# Patient Record
Sex: Female | Born: 1940
Health system: Southern US, Community
[De-identification: ages and names within clinical notes are randomized; demographics above are authoritative.]

## PROBLEM LIST (undated history)

## (undated) DIAGNOSIS — R600 Localized edema: Secondary | ICD-10-CM

## (undated) DIAGNOSIS — F0280 Dementia in other diseases classified elsewhere without behavioral disturbance: Secondary | ICD-10-CM

## (undated) DIAGNOSIS — N179 Acute kidney failure, unspecified: Secondary | ICD-10-CM

## (undated) DIAGNOSIS — M5431 Sciatica, right side: Secondary | ICD-10-CM

## (undated) DIAGNOSIS — E669 Obesity, unspecified: Secondary | ICD-10-CM

## (undated) DIAGNOSIS — E6689 Other obesity not elsewhere classified: Secondary | ICD-10-CM

## (undated) DIAGNOSIS — E872 Acidosis, unspecified: Secondary | ICD-10-CM

## (undated) DIAGNOSIS — G629 Polyneuropathy, unspecified: Secondary | ICD-10-CM

## (undated) DIAGNOSIS — N319 Neuromuscular dysfunction of bladder, unspecified: Secondary | ICD-10-CM

## (undated) DIAGNOSIS — F32A Depression, unspecified: Secondary | ICD-10-CM

## (undated) DIAGNOSIS — I10 Essential (primary) hypertension: Secondary | ICD-10-CM

## (undated) DIAGNOSIS — F419 Anxiety disorder, unspecified: Secondary | ICD-10-CM

## (undated) DIAGNOSIS — E538 Deficiency of other specified B group vitamins: Secondary | ICD-10-CM

## (undated) DIAGNOSIS — Z87440 Personal history of urinary (tract) infections: Secondary | ICD-10-CM

## (undated) DIAGNOSIS — E785 Hyperlipidemia, unspecified: Secondary | ICD-10-CM

## (undated) DIAGNOSIS — Z794 Long term (current) use of insulin: Secondary | ICD-10-CM

## (undated) DIAGNOSIS — G9340 Encephalopathy, unspecified: Secondary | ICD-10-CM

## (undated) DIAGNOSIS — K5900 Constipation, unspecified: Secondary | ICD-10-CM

## (undated) DIAGNOSIS — R319 Hematuria, unspecified: Secondary | ICD-10-CM

## (undated) DIAGNOSIS — E876 Hypokalemia: Secondary | ICD-10-CM

## (undated) DIAGNOSIS — N3941 Urge incontinence: Secondary | ICD-10-CM

## (undated) DIAGNOSIS — G8929 Other chronic pain: Secondary | ICD-10-CM

## (undated) DIAGNOSIS — F0390 Unspecified dementia without behavioral disturbance: Secondary | ICD-10-CM

## (undated) DIAGNOSIS — E611 Iron deficiency: Secondary | ICD-10-CM

## (undated) DIAGNOSIS — K589 Irritable bowel syndrome without diarrhea: Secondary | ICD-10-CM

## (undated) DIAGNOSIS — N189 Chronic kidney disease, unspecified: Secondary | ICD-10-CM

## (undated) DIAGNOSIS — E038 Other specified hypothyroidism: Secondary | ICD-10-CM

## (undated) DIAGNOSIS — G5600 Carpal tunnel syndrome, unspecified upper limb: Secondary | ICD-10-CM

## (undated) DIAGNOSIS — I2510 Atherosclerotic heart disease of native coronary artery without angina pectoris: Secondary | ICD-10-CM

## (undated) DIAGNOSIS — E668 Other obesity: Secondary | ICD-10-CM

## (undated) DIAGNOSIS — E034 Atrophy of thyroid (acquired): Secondary | ICD-10-CM

## (undated) DIAGNOSIS — M797 Fibromyalgia: Secondary | ICD-10-CM

## (undated) DIAGNOSIS — Z7901 Long term (current) use of anticoagulants: Secondary | ICD-10-CM

## (undated) DIAGNOSIS — E43 Unspecified severe protein-calorie malnutrition: Secondary | ICD-10-CM

## (undated) DIAGNOSIS — R221 Localized swelling, mass and lump, neck: Secondary | ICD-10-CM

## (undated) DIAGNOSIS — K219 Gastro-esophageal reflux disease without esophagitis: Secondary | ICD-10-CM

## (undated) DIAGNOSIS — E559 Vitamin D deficiency, unspecified: Secondary | ICD-10-CM

## (undated) DIAGNOSIS — E119 Type 2 diabetes mellitus without complications: Secondary | ICD-10-CM

## (undated) DIAGNOSIS — G4733 Obstructive sleep apnea (adult) (pediatric): Secondary | ICD-10-CM

## (undated) HISTORY — DX: Gastro-esophageal reflux disease without esophagitis: K21.9

## (undated) HISTORY — PX: APPENDECTOMY: SHX54

## (undated) HISTORY — DX: Type 2 diabetes mellitus without complications: E11.9

## (undated) HISTORY — DX: Deficiency of other specified B group vitamins: E53.8

## (undated) HISTORY — DX: Other obesity not elsewhere classified: E66.89

## (undated) HISTORY — PX: HYSTERECTOMY ABDOMINAL WITH SALPINGO-OOPHORECTOMY: SHX6792

## (undated) HISTORY — PX: BLADDER SUSPENSION: SHX72

## (undated) HISTORY — PX: VEIN LIGATION AND STRIPPING: SHX2653

## (undated) HISTORY — DX: Other specified hypothyroidism: E03.8

## (undated) HISTORY — DX: Urge incontinence: N39.41

## (undated) HISTORY — DX: Atrophy of thyroid (acquired): E03.4

## (undated) HISTORY — DX: Sciatica, right side: M54.31

## (undated) HISTORY — DX: Carpal tunnel syndrome, unspecified upper limb: G56.00

## (undated) HISTORY — DX: Hyperlipidemia, unspecified: E78.5

## (undated) HISTORY — DX: Localized swelling, mass and lump, neck: R22.1

## (undated) HISTORY — DX: Fibromyalgia: M79.7

## (undated) HISTORY — DX: Other chronic pain: G89.29

## (undated) HISTORY — DX: Vitamin D deficiency, unspecified: E55.9

## (undated) HISTORY — DX: Obstructive sleep apnea (adult) (pediatric): G47.33

## (undated) HISTORY — DX: Essential (primary) hypertension: I10

## (undated) HISTORY — PX: BREAST BIOPSY: SHX20

## (undated) HISTORY — DX: Neuromuscular dysfunction of bladder, unspecified: N31.9

## (undated) HISTORY — DX: Irritable bowel syndrome, unspecified: K58.9

## (undated) HISTORY — DX: Localized edema: R60.0

## (undated) HISTORY — DX: Other obesity: E66.8

## (undated) HISTORY — PX: LUMBAR LAMINECTOMY: SHX95

---

## 1978-11-10 HISTORY — PX: CHOLECYSTECTOMY OPEN: SUR202

## 2000-11-10 HISTORY — PX: TOTAL SHOULDER REPLACEMENT: SUR1217

## 2000-12-03 ENCOUNTER — Ambulatory Visit (HOSPITAL_COMMUNITY): Admission: RE | Admit: 2000-12-03 | Discharge: 2000-12-03 | Payer: Self-pay | Admitting: Orthopedic Surgery

## 2000-12-03 ENCOUNTER — Encounter: Payer: Self-pay | Admitting: Orthopedic Surgery

## 2003-12-25 ENCOUNTER — Inpatient Hospital Stay (HOSPITAL_COMMUNITY): Admission: RE | Admit: 2003-12-25 | Discharge: 2003-12-28 | Payer: Self-pay | Admitting: Orthopedic Surgery

## 2004-12-16 ENCOUNTER — Ambulatory Visit: Payer: Self-pay | Admitting: Pulmonary Disease

## 2005-01-14 ENCOUNTER — Ambulatory Visit: Payer: Self-pay | Admitting: Internal Medicine

## 2005-01-30 ENCOUNTER — Ambulatory Visit: Payer: Self-pay | Admitting: Internal Medicine

## 2007-05-27 ENCOUNTER — Ambulatory Visit (HOSPITAL_BASED_OUTPATIENT_CLINIC_OR_DEPARTMENT_OTHER): Admission: RE | Admit: 2007-05-27 | Discharge: 2007-05-27 | Payer: Self-pay | Admitting: Orthopedic Surgery

## 2007-05-27 ENCOUNTER — Encounter (INDEPENDENT_AMBULATORY_CARE_PROVIDER_SITE_OTHER): Payer: Self-pay | Admitting: Orthopedic Surgery

## 2007-12-31 ENCOUNTER — Inpatient Hospital Stay (HOSPITAL_COMMUNITY): Admission: RE | Admit: 2007-12-31 | Discharge: 2008-01-01 | Payer: Self-pay | Admitting: Neurosurgery

## 2011-02-09 HISTORY — PX: RECTOCELE REPAIR: SHX761

## 2011-03-25 NOTE — Op Note (Signed)
Cindy Gordon             ACCOUNT NO.:  [id number]   MEDICAL RECORD NO.:  [id number]          PATIENT TYPE:  INP   LOCATION:  2899                         FACILITY:  [facility]   PHYSICIAN:  Marchia Meiers. Vertell Limber, M.D.  DATE OF BIRTH:  12/24/1940   DATE OF PROCEDURE:  12/31/2007  DATE OF DISCHARGE:                               OPERATIVE REPORT   PREOPERATIVE DIAGNOSIS:  L4-L5 stenosis, spondylosis, herniated disc,  and radiculopathy.   POSTOPERATIVE DIAGNOSIS:  L4-L5 stenosis, spondylosis, herniated disc,  and radiculopathy.   PROCEDURE:  Bilateral decompression at L4-L5 for stenosis with  microdissection.   SURGEON:  Marchia Meiers. Vertell Limber, M.D.   ASSISTANT:  Otilio Connors, M.D.  Verdis Prime, R.N.   ANESTHESIA:  General endotracheal anesthesia.   ESTIMATED BLOOD LOSS:  Minimal.   COMPLICATIONS:  None.   DISPOSITION:  To recovery.   INDICATIONS:  Cindy Gordon is a 70 year old woman with bilateral  stenosis at L4-L5 with severe left leg pain and weakness.  It was  elected to take her to surgery for decompression at the L4-L5 level.  The plan was to do this in conjunction with Baxano Spinal Decompression  System.   PROCEDURE IN DETAIL:  The patient was brought to the operating room and  following satisfactory uncomplicated general endotracheal anesthesia and  placement of intravenous lines, she had EMG electrodes placed at L2, L3,  L4, L5 and S1 dermatomes.  The patient was then turned in a prone  position on the Wilson frame.  Her low back was prepped and draped in  the usual sterile fashion. After confirming the correct level with C-arm  fluoroscopy, a 4 cm midline incision was made and carried through  copious adipose tissue to the lumbodorsal fascia.  This was incised  bilaterally.  A subperiosteal dissection was performed exposing the L4-  L5 interspace and intraoperative fluoroscopic image correctly  demonstrated this level. A hemilaminotomy of L4 was performed with  a  high speed drill and Kerrison rongeurs with decompression of ligamentous  tissue and the decompression was carried to the midline. Using a Baxano  neural localization probe and multiblade shaver, a foraminal  decompression was performed with 30 reciprocating strokes and then a  decompression along the L5 nerve root, again with 30 strokes of  reciprocation, and after neural localization confirmed no evidence of  irritability in the L5 or L4 nerve roots.  C-arm radiography  demonstrated good bone and ligamentous removal and decompression along  the neural elements.  Subsequently, an effort was made to perform a  decompression ipsilaterally, but there was increased stimulation along  the L5 nerve root and L4 nerve root and it was felt the foraminal  stenosis was too severe and consequently, the decompression was done  using standard microdissection technique with operating microscope with  medial facetectomy and decompression along the L5 nerve root, with a  foraminotomy along the L5 nerve root. At this point, dilator probes  could easily be inserted along the course of the L5 nerve root, the  thecal sac, and into the neural foramen.  Hemostasis then assured.  The  wound  was irrigated.  The operative site was bathed in fentanyl and Depo-  Medrol.  The self-retaining retractor was removed.  The lumbodorsal  fascia was closed with 0 Vicryl sutures, subcutaneous tissues were  reapproximated with 2-0 Vicryl interrupted inverted sutures, and the  skin edges were reapproximated with interrupted 3-0 Vicryl subcuticular  stitch.  The wound was dressed with Dermabond.  The patient was  extubated in the operating table and taken to recovery in stable  satisfactory condition having tolerated the operation well.  Counts were  correct at the end of the case.      Marchia Meiers. Vertell Limber, M.D.  Electronically Signed     JDS/MEDQ  D:  12/31/2007  T:  01/01/2008  Job:  [id number]

## 2011-03-25 NOTE — Op Note (Signed)
Cindy, Gordon             ACCOUNT NO.:  [id number]   MEDICAL RECORD NO.:  [id number]          PATIENT TYPE:  AMB   LOCATION:  [place]                          FACILITY:  [facility]   PHYSICIAN:  Youlanda Mighty. Sypher, M.D. 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   OPERATION:  Marginal excision of left ring finger middle phalangeal  giant cell tumor.   SURGEON:  Youlanda Mighty. Sypher, M.D.   ASSISTANT:  Marily Lente. Dasnoit, P.A.-C.   ANESTHESIA:  IV regional at proximal brachial level.   SUPERVISING ANESTHESIOLOGIST:  Leda Quail, M.D.   INDICATIONS:  Cindy Gordon is a 70 year old woman referred through  the courtesy of Dr. Frederik Pear, attending orthopedic surgeon, for  evaluation and management of an enlarging mass on the radial aspect of  her left ring finger middle phalangeal segment.  Cindy Gordon is well  acquainted with our practice.  She was seeing Dr. Mayer Camel for follow up of  a shoulder reconstruction he had performed and brought to his attention  an enlarging mass on the radial aspect of her left ring finger middle  phalangeal segment.  The mass appeared to be infiltrating into her  extensor and flexor mechanisms.  Dr. Mayer Camel sent her for an MRI that  documented an infiltrating mass consistent with a probable giant cell  tumor.  She was subsequently referred for an upper extremity orthopedic  consult.  After informed consent, she is brought to the operating at  this time anticipating excisional biopsy.   PROCEDURE:  Cindy Gordon is brought to the operating room and placed  in the supine position on the operating table.  After proper  identification of her operative site and recognition of her penicillin  allergy  and intolerance of codeine, the left arm was prepped with  Betadine soap solution and sterilely draped.  A pneumatic tourniquet was  applied to the proximal brachium.  Following exsanguination of her arm,  an IV regional block was placed.  The arm was then prepped with Betadine  soap solution and sterilely draped.   When anesthesia was noted to be adequate, we proceeded with a radial mid  lateral incision exposing the mass and the radial neurovascular bundle.  The radial proper digital artery and nerve were tented by the giant cell  tumor that measured approximately 15 mm length and 12 mm in width.  This  had infiltrated deep to the flexor mechanism near the vincula to the  profundus tendon.  This had also infiltrated superficially on the dorsum  of the finger and extended deep to the extensor.  The margins of the  giant cell tumor were identified and using the pseudo-encapsulation as a  guide to its extent, the entire lesion was excised en bloc.  A fine  rongeur was then used to strip the periosteum off of the middle phalanx  in  an effort to prevent recurrence.  Bleeding points were  electrocauterized with bipolar current followed by use of the bipolar  forceps to desiccate the periosteum at the site of origin of the mass.  It appeared this was originating deep to the flexor tendon adjacent to  the profundus vincula.   The wound was then repaired with multiple trauma sutures of 5-0 nylon.  A compressive dressing was applied with Xeroflow, sterile gauze and  Coban.  There were no apparent complications.  Cindy Gordon tolerated  the surgery and anesthesia well.  She was transferred to the recovery  room with stable vital signs.      Youlanda Mighty Sypher, M.D.  Electronically Signed     RVS/MEDQ  D:  05/27/2007  T:  05/27/2007  Job:  [id number]   cc:   Kathalene Frames. Mayer Camel, M.D.

## 2011-03-28 NOTE — Op Note (Signed)
NAME:  Cindy Gordon, Cindy Gordon                       ACCOUNT NO.:  [id number]   MEDICAL RECORD NO.:  [id number]                   PATIENT TYPE:  INP   LOCATION:  NA                                   FACILITY:  [facility]   PHYSICIAN:  Kathalene Frames. Mayer Camel, M.D.                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   PROCEDURE:  Right shoulder hemiarthroplasty using a DePuy global total  shoulder system with a 10 stem and a 44 x 18 CTA head for rotator cuff  deficiency.   SURGEON:  Kathalene Frames. Mayer Camel, M.D.   FIRST ASSISTANT:  Opal Sidles B. Mancel Bale, P.A.-C.   ANESTHESIA:  General endotracheal anesthesia.   ESTIMATED BLOOD LOSS:  300 mL.   FLUIDS REPLACED:  1200 mL Crystalloid.   DRAINS:  None.   TOURNIQUET TIME:  None.   INDICATIONS FOR PROCEDURE:  The patient is a 70 year old woman who had an  open rotator cuff repair 5 years ago and was complicated  with a  postoperative Staph infection down in [place], [place]. She  underwent 2 debridements, one on the heel, uneventfully except for post  infectious degenerative arthritis of the right shoulder which we have  monitored carefully  for the last 4 years and treated with antiinflammatory  medications, pain medications and cortisone injections. There has been no  further  signs of infection for 4-1/2 years and she now desires elective  right shoulder hemiarthroplasty with a probable CTA ahead for rotator cuff  deficiency. She has a high riding humeral head consistent with a rotator  cuff deficiency, complete loss of articular cartilage of the humeral head  and some subchondral cystic changes. Eversion of the glenoid fortunately is  relatively  normal.   DESCRIPTION OF PROCEDURE:   The patient was identified by arm band and taken  to the operating room at [facility]. Appropriate anesthetic  monitors were attached and general endotracheal anesthesia was induced for  the patient in the supine position. She was then placed in the beachchair  position about 45 degrees, and the right upper extremity was prepped and  draped in the usual sterile fashion from the wrist to the hemithorax.   A  deltopectoral incision starting out just above the clavicle going over  the coracoid and then distally towards the insertion of the deltoid muscle  was made approximately 15 cm in length through the skin and subcutaneous  tissue. We identified  the cephalic vein which was retracted laterally along  with the deltoid muscle, exposing the coracoid process and the conjoint  tendon.   We retracted  the conjoint tendon medially and the deltoid laterally,  exposing the  clavipectoral fascia, which was then incised, exposing the  subscapularis tendon which was then peeled off the insertion on the lesser  tuberosity along with the capsule and tagged with five #2 Ethibond sutures.  We immediately identified the arthritic humeral head. Approximately 1 cm of  the pec insertion was then cut superiorly to allow better external rotator  and deliverance of the humeral head from the glenoid.   There was an intact biceps tendon. The supraspinatus tendon was thin and  filmy and basically totally deficient. At the apex of the humeral head  laterally, we entered the proximal  humerus with the hand reamer from the  DePuy global set and reamed up to a 10 axial reamer to the appropriate depth  and then placed the cutting guide on the reamer, setting  the eversion at  about 30 degrees of retroversion and using the guide to locate this. The  guide was then pinned into place to allow the humeral head resection.   The reamer was then removed and the humeral head resected and measured to a  44 x 18  humeral head. The humeral body osteotome was then hammered into  place in the correct version, followed  by broaching up to a 10 broach at  the appropriate depth. Using rongeurs we cleared out a small  area of bone  laterally and applied a 44 x 18 CTA head onto the broach, and the shoulder  was reduced and stability was noted to external rotation of 60 to 70 degrees  without difficulty as well as full internal rotation.   A real 10 stem was then opened as well as a 44 x 18 humeral head. All bony  surfaces were water picked, dried with suction and sponges and we hammered  into place the 10 stem in the correct version and  the 44 x 18 humeral head  was then hammered onto the stem. The glenoid was cleared. The shoulder was  reduced and stability was noted to be excellent.   We then repaired the capsule and subscapularis back to the lesser tuberosity  through drill holes with the five #2 Ethibond sutures. Stability was once  again checked and found to be excellent. The wound was once again irrigated  with normal saline solution. The subcutaneous tissue was closed with 2-0  Vicryl suture and the skin with running interlocking 3-0 nylon suture. A  shoulder immobilizer was applied. The patient was placed in the supine  position, awakened and taken to the recovery room without difficulty.                                               Kathalene Frames. Mayer Camel, M.D.    Cindy Gordon  D:  12/25/2003  T:  12/25/2003  Job:  [id number]

## 2011-03-28 NOTE — Consult Note (Signed)
NAME:  Cindy Gordon, Cindy Gordon                       ACCOUNT NO.:  [id number]   MEDICAL RECORD NO.:  [id number]                   PATIENT TYPE:  INP   LOCATION:  [id number]                                 FACILITY:  [facility]   PHYSICIAN:  Asencion Noble, M.D. [facility]            DATE OF BIRTH:  Oct 07, 1941   DATE OF CONSULTATION:  12/25/2003  DATE OF DISCHARGE:                                   CONSULTATION   REFERRING PHYSICIAN:  Kathalene Frames. Mayer Camel, M.D.   CHIEF COMPLAINT:  Apnea.   HISTORY OF PRESENT ILLNESS:  A 70 year old white female underwent right  hemiarthroplasty of the shoulder this morning, now has postop apnea and is  on BiPAP.  Previous history of snoring and possible sleep apnea, although no  formal sleep study has ever been done.  Previous history of cardiomyopathy,  hypertension, no MI. Does have no prior history of known lung disease.   MEDICATIONS PRIOR TO ADMISSION:  Zantac, Flexeril, Librax, Neurontin,  Zyrtec, Flexeril, Bextra, Estrace, aspirin.   ALLERGIES:  PENICILLIN AND CODEINE.   PHYSICAL EXAMINATION:  VITAL SIGNS:  Blood pressure 130/80, pulse 60,  saturations 100% . Patient is on a full face bilevel mask.  LUNGS:  Clear.  CARDIOVASCULAR:  Regular rate and rhythm.  ABDOMEN:  Soft, nontender.  EXTREMITIES:  Right shoulder dressing intact.   LABORATORY DATA:  No postop labs as yet obtained.   IMPRESSION:  Probable sleep apnea exacerbated by general anesthesia and  right shoulder hemiarthroplasty.   RECOMMENDATIONS:  Full face bilevel, follow up blood gases, follow up lab  data.                                               Asencion Noble, M.D. [facility]    PW/MEDQ  D:  12/25/2003  T:  12/25/2003  Job:  [id number]   cc:   Kathalene Frames. Mayer Camel, M.D.  [address]  [place]  [place] [address]  Fax: [phone]

## 2011-03-28 NOTE — Discharge Summary (Signed)
NAME:  GAYNEL, JANAS                       ACCOUNT NO.:  [id number]   MEDICAL RECORD NO.:  [id number]                   PATIENT TYPE:  INP   LOCATION:  5024                                 FACILITY:  [facility]   PHYSICIAN:  Kathalene Frames. Mayer Camel, M.D.                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   PROCEDURE:  Right shoulder hemiarthroplasty.   HISTORY OF PRESENT ILLNESS:  The patient is a 70 year old woman who had an  open rotator cuff repair five years prior to this admission which was  complicated with a postoperative staphylococcus infection.  This was  performed in [place], [place].  She underwent two debridements which  was uneventful except for the postinfectious degenerative arthritis of the  right shoulder which has been monitored carefully by Dr. Kathalene Frames. Mayer Camel over  the last four years.  Prior to this surgery, she was treated with anti-  inflammatory medications, pain medications, and cortisone injection.  There  have been no further signs of infection for the last four and a half years  and she is now desiring elective right shoulder hemiarthroplasty with the  probable CLEAR TO AUSCULTATION head for rotator cuff deficiency.  She has  high riding humeral head consistent with rotator cuff deficiency on x-ray  with complete loss of articular cartilage and humeral head along with some  subchondral cystic changes, aversion of the glenoid.  Fortunately, it is  relatively normal.   ALLERGIES:  The patient is allergic to CODEINE which causes and PENICILLIN  which causes rash.   MEDICATIONS:  1. Zestril 40 mg q.d.  2. Librax 5/2.5 t.i.d. p.r.n.  3. Neurontin 300 mg t.i.d.  4. Zyrtec 10 mg q.d.  5. Flexeril 10 mg p.r.n.  6. Bextra 20 mg q.d.  7. Estrace 0.5 mg q.o.d.  8. B12  infections every week.  9. Aspirin 81 mg q.d.  10.      Vitamins.   MEDICATIONS:  1. History of cardiomyopathy.  2. Hypertension.  3. No evidence of a myocardial infarction.  4. No history of known lung disease or other illnesses.  5. She does have a history of fibromyalgia.  6. Gastroesophageal reflux disease with reflux.  7. Chronic anemia.   PAST SURGICAL HISTORY:  1. Rotator cuff repair in 2000 with postoperative infection causing at least     two I&Ds.  2. Cholecystectomy in 1984.  3. Hysterectomy in 1964.  4. Cardiac catheterization.  No difficulties with GET from the surgeries.   PHYSICAL EXAMINATION:  Unfortunately preoperative exam and family history  are unavailable at the time of this dictation.   LABORATORY DATA:  Preoperative labs including CBC, serum CMET, chest x-ray,  EKG, PT, and PTT were all within normal limits  with the exception of her EKG  which did show a nonspecific ST and T-wave abnormalities with possible  anterior ischemia.   HOSPITAL COURSE:  On the date of admission, the patient was taken to [facility] operating room where she underwent a right shoulder  hemiarthroplasty using a DePuy global total shoulder system  hemiarthroplasty, #10 stem, and a 44 by 18 CTA head because of rotator cuff  deficiency.  She was placed on preoperative antibiotics for the first 48  hours.  She was placed on postoperative Coumadin prophylaxis and she is  placed on the PCA Dilaudid pain pump for pain control.  Stent graft seen at  the sign of surgery showed no sign of any type of infectious agent and these  labs remained no growth throughout their culture.   The patient had difficulty postoperatively breathing consistent with  postoperative apnea.  She continued to have difficulties maintaining her  saturation level and Dr. Asencion Noble was consulted because of no previous  treatment for any sort of sleep apnea.  He felt that this was probably  sleep  apnea exacerbated by general anesthesia and she was placed on a BiPAP for  the treatment of this, but she did go to the unit postoperatively because of  this difficulty.   Postoperative day one, the patient was feeling better.  She was doing better  holding her oxygenation without the BiPAP when she was awake.  She was  complaining of moderate pain in the right shoulder with no nausea or  vomiting.  She was afebrile.  Hemoglobin of 9.5, INR of 1.1.  Dressing with  scant bleed through and positive ecchymosis in the right upper extremity  consistent with surgery.   She began gentle passive range of motion with some gentle active assist  motion.  Leaning forward flexion to 90 degrees maximum and a home CPAP was  set up through [facility].   Postoperative day two, the patient was complaining of nausea, no itching,  and positive itching.  Temperature was 100.3.  Ranging to 97.2.  Dressing  was dry.  Decreasing edema in the right upper extremity.  She is otherwise  stable orthopedically and home health was helping to implement respiratory  support as per respiratory therapy suggestion.  She continued with PT and  OT.  PCI was discontinued.   Postoperative day three, the patient continued to make good progress and was  otherwise benign to exam.  She was discharged home to the care of her  family.  She will continue to use support at home using a CPAP machine for  her sleep apnea which was diagnosed during her hospital stay.  She will  continue on gentle passive range of motion exercises and active with passive  assist.  She should return to see Dr. Kathalene Frames. Mayer Camel in approximately one  week time.   DISCHARGE MEDICATIONS:  Prescription were given for Percocet and Coumadin  which will be monitored by home health care for the next two weeks.   FOLLOW UP:  She will return to see in approximately one-week time for follow-  up.  DIET:  Diet is regular.   CONDITION ON  DISCHARGE:  Stable and improved compared to that prior to  surgery.      Lowell Guitar. Mercie Eon.                     Kathalene Frames. Mayer Camel, M.D.    Andres Ege  D:  01/22/2004  T:  01/23/2004  Job:  [id number]

## 2011-08-01 LAB — BASIC METABOLIC PANEL
CO2: 31
Chloride: 104
Creatinine, Ser: 0.84
GFR calc Af Amer: >60

## 2011-08-01 LAB — CBC
Hemoglobin: 13.1
MCHC: 34.3
MCV: 93.2
RBC: 4.11

## 2011-08-25 LAB — POCT HEMOGLOBIN-HEMACUE: Operator id: 123881

## 2014-08-10 HISTORY — PX: OTHER SURGICAL HISTORY: SHX169

## 2014-11-13 DIAGNOSIS — M25532 Pain in left wrist: Secondary | ICD-10-CM | POA: Diagnosis not present

## 2014-11-15 DIAGNOSIS — M25532 Pain in left wrist: Secondary | ICD-10-CM | POA: Diagnosis not present

## 2014-11-22 DIAGNOSIS — E782 Mixed hyperlipidemia: Secondary | ICD-10-CM | POA: Diagnosis not present

## 2014-11-22 DIAGNOSIS — E780 Pure hypercholesterolemia: Secondary | ICD-10-CM | POA: Diagnosis not present

## 2014-11-22 DIAGNOSIS — G5602 Carpal tunnel syndrome, left upper limb: Secondary | ICD-10-CM | POA: Diagnosis not present

## 2014-11-22 DIAGNOSIS — I10 Essential (primary) hypertension: Secondary | ICD-10-CM | POA: Diagnosis not present

## 2014-11-22 DIAGNOSIS — N3941 Urge incontinence: Secondary | ICD-10-CM | POA: Diagnosis not present

## 2014-11-22 DIAGNOSIS — M797 Fibromyalgia: Secondary | ICD-10-CM | POA: Diagnosis not present

## 2014-11-22 DIAGNOSIS — R7301 Impaired fasting glucose: Secondary | ICD-10-CM | POA: Diagnosis not present

## 2014-12-16 ENCOUNTER — Encounter: Payer: Self-pay | Admitting: Internal Medicine

## 2015-01-15 DIAGNOSIS — G5791 Unspecified mononeuropathy of right lower limb: Secondary | ICD-10-CM | POA: Diagnosis not present

## 2015-01-15 DIAGNOSIS — G629 Polyneuropathy, unspecified: Secondary | ICD-10-CM | POA: Diagnosis not present

## 2015-01-15 DIAGNOSIS — M5431 Sciatica, right side: Secondary | ICD-10-CM | POA: Diagnosis not present

## 2015-01-15 DIAGNOSIS — N390 Urinary tract infection, site not specified: Secondary | ICD-10-CM | POA: Diagnosis not present

## 2015-01-15 DIAGNOSIS — M7061 Trochanteric bursitis, right hip: Secondary | ICD-10-CM | POA: Diagnosis not present

## 2015-02-13 DIAGNOSIS — M5416 Radiculopathy, lumbar region: Secondary | ICD-10-CM | POA: Diagnosis not present

## 2015-02-26 DIAGNOSIS — R0602 Shortness of breath: Secondary | ICD-10-CM | POA: Diagnosis not present

## 2015-02-26 DIAGNOSIS — R9431 Abnormal electrocardiogram [ECG] [EKG]: Secondary | ICD-10-CM | POA: Diagnosis not present

## 2015-02-26 DIAGNOSIS — I10 Essential (primary) hypertension: Secondary | ICD-10-CM | POA: Diagnosis not present

## 2015-02-26 DIAGNOSIS — E780 Pure hypercholesterolemia: Secondary | ICD-10-CM | POA: Diagnosis not present

## 2015-02-26 DIAGNOSIS — J309 Allergic rhinitis, unspecified: Secondary | ICD-10-CM | POA: Diagnosis not present

## 2015-02-26 DIAGNOSIS — R7301 Impaired fasting glucose: Secondary | ICD-10-CM | POA: Diagnosis not present

## 2015-04-17 DIAGNOSIS — M7062 Trochanteric bursitis, left hip: Secondary | ICD-10-CM | POA: Diagnosis not present

## 2015-04-17 DIAGNOSIS — M5416 Radiculopathy, lumbar region: Secondary | ICD-10-CM | POA: Diagnosis not present

## 2015-05-29 DIAGNOSIS — N319 Neuromuscular dysfunction of bladder, unspecified: Secondary | ICD-10-CM | POA: Diagnosis not present

## 2015-05-29 DIAGNOSIS — E780 Pure hypercholesterolemia: Secondary | ICD-10-CM | POA: Diagnosis not present

## 2015-05-29 DIAGNOSIS — I10 Essential (primary) hypertension: Secondary | ICD-10-CM | POA: Diagnosis not present

## 2015-05-29 DIAGNOSIS — R7301 Impaired fasting glucose: Secondary | ICD-10-CM | POA: Diagnosis not present

## 2015-05-29 DIAGNOSIS — G629 Polyneuropathy, unspecified: Secondary | ICD-10-CM | POA: Diagnosis not present

## 2015-05-29 DIAGNOSIS — K219 Gastro-esophageal reflux disease without esophagitis: Secondary | ICD-10-CM | POA: Diagnosis not present

## 2015-05-29 DIAGNOSIS — J309 Allergic rhinitis, unspecified: Secondary | ICD-10-CM | POA: Diagnosis not present

## 2015-05-29 DIAGNOSIS — M797 Fibromyalgia: Secondary | ICD-10-CM | POA: Diagnosis not present

## 2015-06-20 ENCOUNTER — Encounter: Payer: Self-pay | Admitting: Internal Medicine

## 2015-07-10 DIAGNOSIS — M47816 Spondylosis without myelopathy or radiculopathy, lumbar region: Secondary | ICD-10-CM | POA: Diagnosis not present

## 2015-07-30 DIAGNOSIS — J018 Other acute sinusitis: Secondary | ICD-10-CM | POA: Diagnosis not present

## 2015-08-07 DIAGNOSIS — R609 Edema, unspecified: Secondary | ICD-10-CM | POA: Diagnosis not present

## 2015-08-09 DIAGNOSIS — F4321 Adjustment disorder with depressed mood: Secondary | ICD-10-CM | POA: Diagnosis not present

## 2015-08-09 DIAGNOSIS — I10 Essential (primary) hypertension: Secondary | ICD-10-CM | POA: Diagnosis not present

## 2015-08-09 DIAGNOSIS — T783XXA Angioneurotic edema, initial encounter: Secondary | ICD-10-CM | POA: Diagnosis not present

## 2015-08-09 DIAGNOSIS — Z23 Encounter for immunization: Secondary | ICD-10-CM | POA: Diagnosis not present

## 2015-08-31 DIAGNOSIS — N319 Neuromuscular dysfunction of bladder, unspecified: Secondary | ICD-10-CM | POA: Diagnosis not present

## 2015-08-31 DIAGNOSIS — Z1231 Encounter for screening mammogram for malignant neoplasm of breast: Secondary | ICD-10-CM | POA: Diagnosis not present

## 2015-08-31 DIAGNOSIS — M797 Fibromyalgia: Secondary | ICD-10-CM | POA: Diagnosis not present

## 2015-08-31 DIAGNOSIS — I10 Essential (primary) hypertension: Secondary | ICD-10-CM | POA: Diagnosis not present

## 2015-08-31 DIAGNOSIS — E7800 Pure hypercholesterolemia, unspecified: Secondary | ICD-10-CM | POA: Diagnosis not present

## 2015-08-31 DIAGNOSIS — K5901 Slow transit constipation: Secondary | ICD-10-CM | POA: Diagnosis not present

## 2015-08-31 DIAGNOSIS — Z23 Encounter for immunization: Secondary | ICD-10-CM | POA: Diagnosis not present

## 2015-08-31 DIAGNOSIS — R7301 Impaired fasting glucose: Secondary | ICD-10-CM | POA: Diagnosis not present

## 2015-09-25 DIAGNOSIS — Z1211 Encounter for screening for malignant neoplasm of colon: Secondary | ICD-10-CM | POA: Diagnosis not present

## 2015-09-25 DIAGNOSIS — Z6831 Body mass index (BMI) 31.0-31.9, adult: Secondary | ICD-10-CM | POA: Diagnosis not present

## 2015-09-25 DIAGNOSIS — Z23 Encounter for immunization: Secondary | ICD-10-CM | POA: Diagnosis not present

## 2015-09-25 DIAGNOSIS — Z0000 Encounter for general adult medical examination without abnormal findings: Secondary | ICD-10-CM | POA: Diagnosis not present

## 2015-10-05 DIAGNOSIS — Z1231 Encounter for screening mammogram for malignant neoplasm of breast: Secondary | ICD-10-CM | POA: Diagnosis not present

## 2015-12-11 DIAGNOSIS — L84 Corns and callosities: Secondary | ICD-10-CM | POA: Diagnosis not present

## 2015-12-11 DIAGNOSIS — M797 Fibromyalgia: Secondary | ICD-10-CM | POA: Diagnosis not present

## 2015-12-11 DIAGNOSIS — M7062 Trochanteric bursitis, left hip: Secondary | ICD-10-CM | POA: Diagnosis not present

## 2015-12-11 DIAGNOSIS — E782 Mixed hyperlipidemia: Secondary | ICD-10-CM | POA: Diagnosis not present

## 2015-12-11 DIAGNOSIS — N319 Neuromuscular dysfunction of bladder, unspecified: Secondary | ICD-10-CM | POA: Diagnosis not present

## 2015-12-11 DIAGNOSIS — I10 Essential (primary) hypertension: Secondary | ICD-10-CM | POA: Diagnosis not present

## 2015-12-11 DIAGNOSIS — R7301 Impaired fasting glucose: Secondary | ICD-10-CM | POA: Diagnosis not present

## 2015-12-26 DIAGNOSIS — N3001 Acute cystitis with hematuria: Secondary | ICD-10-CM | POA: Diagnosis not present

## 2015-12-26 DIAGNOSIS — N3000 Acute cystitis without hematuria: Secondary | ICD-10-CM | POA: Diagnosis not present

## 2016-01-10 DIAGNOSIS — N3001 Acute cystitis with hematuria: Secondary | ICD-10-CM | POA: Diagnosis not present

## 2016-03-12 DIAGNOSIS — R7301 Impaired fasting glucose: Secondary | ICD-10-CM | POA: Diagnosis not present

## 2016-03-12 DIAGNOSIS — E559 Vitamin D deficiency, unspecified: Secondary | ICD-10-CM | POA: Diagnosis not present

## 2016-03-12 DIAGNOSIS — I10 Essential (primary) hypertension: Secondary | ICD-10-CM | POA: Diagnosis not present

## 2016-03-20 DIAGNOSIS — N319 Neuromuscular dysfunction of bladder, unspecified: Secondary | ICD-10-CM | POA: Diagnosis not present

## 2016-03-20 DIAGNOSIS — M7062 Trochanteric bursitis, left hip: Secondary | ICD-10-CM | POA: Diagnosis not present

## 2016-03-20 DIAGNOSIS — I10 Essential (primary) hypertension: Secondary | ICD-10-CM | POA: Diagnosis not present

## 2016-03-20 DIAGNOSIS — E782 Mixed hyperlipidemia: Secondary | ICD-10-CM | POA: Diagnosis not present

## 2016-03-20 DIAGNOSIS — R300 Dysuria: Secondary | ICD-10-CM | POA: Diagnosis not present

## 2016-03-20 DIAGNOSIS — R7301 Impaired fasting glucose: Secondary | ICD-10-CM | POA: Diagnosis not present

## 2016-03-20 DIAGNOSIS — M6281 Muscle weakness (generalized): Secondary | ICD-10-CM | POA: Diagnosis not present

## 2016-03-20 DIAGNOSIS — M797 Fibromyalgia: Secondary | ICD-10-CM | POA: Diagnosis not present

## 2016-06-25 DIAGNOSIS — Z1211 Encounter for screening for malignant neoplasm of colon: Secondary | ICD-10-CM | POA: Diagnosis not present

## 2016-06-25 DIAGNOSIS — M25551 Pain in right hip: Secondary | ICD-10-CM | POA: Diagnosis not present

## 2016-06-25 DIAGNOSIS — E782 Mixed hyperlipidemia: Secondary | ICD-10-CM | POA: Diagnosis not present

## 2016-06-25 DIAGNOSIS — I10 Essential (primary) hypertension: Secondary | ICD-10-CM | POA: Diagnosis not present

## 2016-06-25 DIAGNOSIS — M797 Fibromyalgia: Secondary | ICD-10-CM | POA: Diagnosis not present

## 2016-06-25 DIAGNOSIS — Z1239 Encounter for other screening for malignant neoplasm of breast: Secondary | ICD-10-CM | POA: Diagnosis not present

## 2016-06-25 DIAGNOSIS — R7301 Impaired fasting glucose: Secondary | ICD-10-CM | POA: Diagnosis not present

## 2016-06-25 DIAGNOSIS — N319 Neuromuscular dysfunction of bladder, unspecified: Secondary | ICD-10-CM | POA: Diagnosis not present

## 2016-07-05 DIAGNOSIS — M7070 Other bursitis of hip, unspecified hip: Secondary | ICD-10-CM | POA: Diagnosis not present

## 2016-07-05 DIAGNOSIS — M7072 Other bursitis of hip, left hip: Secondary | ICD-10-CM | POA: Diagnosis not present

## 2016-07-07 DIAGNOSIS — N644 Mastodynia: Secondary | ICD-10-CM | POA: Diagnosis not present

## 2016-07-07 DIAGNOSIS — N63 Unspecified lump in breast: Secondary | ICD-10-CM | POA: Diagnosis not present

## 2016-07-21 DIAGNOSIS — M47816 Spondylosis without myelopathy or radiculopathy, lumbar region: Secondary | ICD-10-CM | POA: Diagnosis not present

## 2016-07-21 DIAGNOSIS — M7062 Trochanteric bursitis, left hip: Secondary | ICD-10-CM | POA: Diagnosis not present

## 2016-08-21 DIAGNOSIS — M47816 Spondylosis without myelopathy or radiculopathy, lumbar region: Secondary | ICD-10-CM | POA: Diagnosis not present

## 2016-09-17 DIAGNOSIS — Z1211 Encounter for screening for malignant neoplasm of colon: Secondary | ICD-10-CM | POA: Diagnosis not present

## 2016-09-17 LAB — HM COLONOSCOPY

## 2016-09-20 DIAGNOSIS — R42 Dizziness and giddiness: Secondary | ICD-10-CM | POA: Diagnosis not present

## 2016-09-20 DIAGNOSIS — N3000 Acute cystitis without hematuria: Secondary | ICD-10-CM | POA: Diagnosis not present

## 2016-09-20 DIAGNOSIS — I10 Essential (primary) hypertension: Secondary | ICD-10-CM | POA: Diagnosis not present

## 2016-09-20 DIAGNOSIS — Z780 Asymptomatic menopausal state: Secondary | ICD-10-CM | POA: Diagnosis not present

## 2016-09-20 DIAGNOSIS — F439 Reaction to severe stress, unspecified: Secondary | ICD-10-CM | POA: Diagnosis not present

## 2016-09-20 DIAGNOSIS — R9431 Abnormal electrocardiogram [ECG] [EKG]: Secondary | ICD-10-CM | POA: Diagnosis not present

## 2016-09-20 DIAGNOSIS — Z885 Allergy status to narcotic agent status: Secondary | ICD-10-CM | POA: Diagnosis not present

## 2016-09-20 DIAGNOSIS — E860 Dehydration: Secondary | ICD-10-CM | POA: Diagnosis not present

## 2016-09-29 DIAGNOSIS — J0100 Acute maxillary sinusitis, unspecified: Secondary | ICD-10-CM | POA: Diagnosis not present

## 2016-09-29 DIAGNOSIS — Z13820 Encounter for screening for osteoporosis: Secondary | ICD-10-CM | POA: Diagnosis not present

## 2016-09-29 DIAGNOSIS — M797 Fibromyalgia: Secondary | ICD-10-CM | POA: Diagnosis not present

## 2016-09-29 DIAGNOSIS — I10 Essential (primary) hypertension: Secondary | ICD-10-CM | POA: Diagnosis not present

## 2016-09-29 DIAGNOSIS — R7301 Impaired fasting glucose: Secondary | ICD-10-CM | POA: Diagnosis not present

## 2016-09-29 DIAGNOSIS — M25551 Pain in right hip: Secondary | ICD-10-CM | POA: Diagnosis not present

## 2016-09-29 DIAGNOSIS — E782 Mixed hyperlipidemia: Secondary | ICD-10-CM | POA: Diagnosis not present

## 2016-09-29 DIAGNOSIS — E559 Vitamin D deficiency, unspecified: Secondary | ICD-10-CM | POA: Diagnosis not present

## 2016-09-29 DIAGNOSIS — Z23 Encounter for immunization: Secondary | ICD-10-CM | POA: Diagnosis not present

## 2016-10-08 DIAGNOSIS — E782 Mixed hyperlipidemia: Secondary | ICD-10-CM | POA: Diagnosis not present

## 2016-10-08 DIAGNOSIS — E559 Vitamin D deficiency, unspecified: Secondary | ICD-10-CM | POA: Diagnosis not present

## 2016-10-08 DIAGNOSIS — I10 Essential (primary) hypertension: Secondary | ICD-10-CM | POA: Diagnosis not present

## 2016-10-08 DIAGNOSIS — R7301 Impaired fasting glucose: Secondary | ICD-10-CM | POA: Diagnosis not present

## 2016-10-20 DIAGNOSIS — N3941 Urge incontinence: Secondary | ICD-10-CM | POA: Diagnosis not present

## 2016-10-20 DIAGNOSIS — M25552 Pain in left hip: Secondary | ICD-10-CM | POA: Diagnosis not present

## 2016-10-20 DIAGNOSIS — R202 Paresthesia of skin: Secondary | ICD-10-CM | POA: Diagnosis not present

## 2016-10-20 DIAGNOSIS — Z0001 Encounter for general adult medical examination with abnormal findings: Secondary | ICD-10-CM | POA: Diagnosis not present

## 2016-10-20 DIAGNOSIS — Z6831 Body mass index (BMI) 31.0-31.9, adult: Secondary | ICD-10-CM | POA: Diagnosis not present

## 2016-10-20 DIAGNOSIS — E663 Overweight: Secondary | ICD-10-CM | POA: Diagnosis not present

## 2016-10-20 DIAGNOSIS — S79912A Unspecified injury of left hip, initial encounter: Secondary | ICD-10-CM | POA: Diagnosis not present

## 2016-10-22 DIAGNOSIS — M7061 Trochanteric bursitis, right hip: Secondary | ICD-10-CM | POA: Diagnosis not present

## 2016-10-27 DIAGNOSIS — N959 Unspecified menopausal and perimenopausal disorder: Secondary | ICD-10-CM | POA: Diagnosis not present

## 2016-10-27 DIAGNOSIS — Z1231 Encounter for screening mammogram for malignant neoplasm of breast: Secondary | ICD-10-CM | POA: Diagnosis not present

## 2016-10-27 DIAGNOSIS — Z13820 Encounter for screening for osteoporosis: Secondary | ICD-10-CM | POA: Diagnosis not present

## 2016-10-27 LAB — HM DEXA SCAN: HM Dexa Scan: NORMAL

## 2017-01-22 DIAGNOSIS — M797 Fibromyalgia: Secondary | ICD-10-CM | POA: Diagnosis not present

## 2017-01-22 DIAGNOSIS — E782 Mixed hyperlipidemia: Secondary | ICD-10-CM | POA: Diagnosis not present

## 2017-01-22 DIAGNOSIS — M25512 Pain in left shoulder: Secondary | ICD-10-CM | POA: Diagnosis not present

## 2017-01-22 DIAGNOSIS — R7301 Impaired fasting glucose: Secondary | ICD-10-CM | POA: Diagnosis not present

## 2017-01-22 DIAGNOSIS — E559 Vitamin D deficiency, unspecified: Secondary | ICD-10-CM | POA: Diagnosis not present

## 2017-01-22 DIAGNOSIS — I10 Essential (primary) hypertension: Secondary | ICD-10-CM | POA: Diagnosis not present

## 2017-02-02 DIAGNOSIS — M7062 Trochanteric bursitis, left hip: Secondary | ICD-10-CM | POA: Diagnosis not present

## 2017-02-19 DIAGNOSIS — M25512 Pain in left shoulder: Secondary | ICD-10-CM | POA: Diagnosis not present

## 2017-02-19 DIAGNOSIS — M7542 Impingement syndrome of left shoulder: Secondary | ICD-10-CM | POA: Diagnosis not present

## 2017-04-28 DIAGNOSIS — M25512 Pain in left shoulder: Secondary | ICD-10-CM | POA: Diagnosis not present

## 2017-04-29 DIAGNOSIS — M7552 Bursitis of left shoulder: Secondary | ICD-10-CM | POA: Diagnosis not present

## 2017-04-29 DIAGNOSIS — M75102 Unspecified rotator cuff tear or rupture of left shoulder, not specified as traumatic: Secondary | ICD-10-CM | POA: Diagnosis not present

## 2017-04-29 DIAGNOSIS — M75122 Complete rotator cuff tear or rupture of left shoulder, not specified as traumatic: Secondary | ICD-10-CM | POA: Diagnosis not present

## 2017-04-30 DIAGNOSIS — E559 Vitamin D deficiency, unspecified: Secondary | ICD-10-CM | POA: Diagnosis not present

## 2017-04-30 DIAGNOSIS — I10 Essential (primary) hypertension: Secondary | ICD-10-CM | POA: Diagnosis not present

## 2017-04-30 DIAGNOSIS — E782 Mixed hyperlipidemia: Secondary | ICD-10-CM | POA: Diagnosis not present

## 2017-04-30 DIAGNOSIS — R7301 Impaired fasting glucose: Secondary | ICD-10-CM | POA: Diagnosis not present

## 2017-04-30 DIAGNOSIS — R799 Abnormal finding of blood chemistry, unspecified: Secondary | ICD-10-CM | POA: Diagnosis not present

## 2017-04-30 DIAGNOSIS — M797 Fibromyalgia: Secondary | ICD-10-CM | POA: Diagnosis not present

## 2017-04-30 DIAGNOSIS — M25512 Pain in left shoulder: Secondary | ICD-10-CM | POA: Diagnosis not present

## 2017-04-30 DIAGNOSIS — R600 Localized edema: Secondary | ICD-10-CM | POA: Diagnosis not present

## 2017-04-30 DIAGNOSIS — M7062 Trochanteric bursitis, left hip: Secondary | ICD-10-CM | POA: Diagnosis not present

## 2017-05-06 DIAGNOSIS — M12812 Other specific arthropathies, not elsewhere classified, left shoulder: Secondary | ICD-10-CM | POA: Diagnosis not present

## 2017-05-13 DIAGNOSIS — T63301A Toxic effect of unspecified spider venom, accidental (unintentional), initial encounter: Secondary | ICD-10-CM | POA: Diagnosis not present

## 2017-05-19 DIAGNOSIS — W57XXXA Bitten or stung by nonvenomous insect and other nonvenomous arthropods, initial encounter: Secondary | ICD-10-CM | POA: Diagnosis not present

## 2017-05-19 DIAGNOSIS — T7849XA Other allergy, initial encounter: Secondary | ICD-10-CM | POA: Diagnosis not present

## 2017-06-01 DIAGNOSIS — Z01818 Encounter for other preprocedural examination: Secondary | ICD-10-CM | POA: Diagnosis not present

## 2017-06-01 DIAGNOSIS — Z79899 Other long term (current) drug therapy: Secondary | ICD-10-CM | POA: Diagnosis not present

## 2017-06-01 DIAGNOSIS — M79609 Pain in unspecified limb: Secondary | ICD-10-CM | POA: Diagnosis not present

## 2017-06-01 DIAGNOSIS — Z96619 Presence of unspecified artificial shoulder joint: Secondary | ICD-10-CM | POA: Diagnosis not present

## 2017-06-01 DIAGNOSIS — R52 Pain, unspecified: Secondary | ICD-10-CM | POA: Diagnosis not present

## 2017-06-01 DIAGNOSIS — E559 Vitamin D deficiency, unspecified: Secondary | ICD-10-CM | POA: Diagnosis not present

## 2017-06-01 DIAGNOSIS — Z471 Aftercare following joint replacement surgery: Secondary | ICD-10-CM | POA: Diagnosis not present

## 2017-06-02 DIAGNOSIS — R0602 Shortness of breath: Secondary | ICD-10-CM | POA: Diagnosis not present

## 2017-06-02 DIAGNOSIS — Z01810 Encounter for preprocedural cardiovascular examination: Secondary | ICD-10-CM | POA: Diagnosis not present

## 2017-06-02 DIAGNOSIS — G4733 Obstructive sleep apnea (adult) (pediatric): Secondary | ICD-10-CM | POA: Diagnosis not present

## 2017-06-04 DIAGNOSIS — E034 Atrophy of thyroid (acquired): Secondary | ICD-10-CM | POA: Diagnosis not present

## 2017-06-09 DIAGNOSIS — H16223 Keratoconjunctivitis sicca, not specified as Sjogren's, bilateral: Secondary | ICD-10-CM | POA: Diagnosis not present

## 2017-06-09 DIAGNOSIS — H278 Other specified disorders of lens: Secondary | ICD-10-CM | POA: Diagnosis not present

## 2017-06-23 DIAGNOSIS — H16223 Keratoconjunctivitis sicca, not specified as Sjogren's, bilateral: Secondary | ICD-10-CM | POA: Diagnosis not present

## 2017-06-23 DIAGNOSIS — H25043 Posterior subcapsular polar age-related cataract, bilateral: Secondary | ICD-10-CM | POA: Diagnosis not present

## 2017-06-23 DIAGNOSIS — H278 Other specified disorders of lens: Secondary | ICD-10-CM | POA: Diagnosis not present

## 2017-06-29 DIAGNOSIS — M25512 Pain in left shoulder: Secondary | ICD-10-CM | POA: Diagnosis not present

## 2017-06-29 DIAGNOSIS — M12812 Other specific arthropathies, not elsewhere classified, left shoulder: Secondary | ICD-10-CM | POA: Diagnosis not present

## 2017-07-07 DIAGNOSIS — E669 Obesity, unspecified: Secondary | ICD-10-CM | POA: Diagnosis not present

## 2017-07-07 DIAGNOSIS — K589 Irritable bowel syndrome without diarrhea: Secondary | ICD-10-CM | POA: Diagnosis not present

## 2017-07-07 DIAGNOSIS — M797 Fibromyalgia: Secondary | ICD-10-CM | POA: Diagnosis not present

## 2017-07-07 DIAGNOSIS — M810 Age-related osteoporosis without current pathological fracture: Secondary | ICD-10-CM | POA: Diagnosis not present

## 2017-07-07 DIAGNOSIS — Z8614 Personal history of Methicillin resistant Staphylococcus aureus infection: Secondary | ICD-10-CM | POA: Diagnosis not present

## 2017-07-07 DIAGNOSIS — M19012 Primary osteoarthritis, left shoulder: Secondary | ICD-10-CM | POA: Diagnosis not present

## 2017-07-07 DIAGNOSIS — E039 Hypothyroidism, unspecified: Secondary | ICD-10-CM | POA: Diagnosis not present

## 2017-07-07 DIAGNOSIS — Z96612 Presence of left artificial shoulder joint: Secondary | ICD-10-CM | POA: Diagnosis not present

## 2017-07-07 DIAGNOSIS — K219 Gastro-esophageal reflux disease without esophagitis: Secondary | ICD-10-CM | POA: Diagnosis not present

## 2017-07-07 DIAGNOSIS — Z880 Allergy status to penicillin: Secondary | ICD-10-CM | POA: Diagnosis not present

## 2017-07-07 DIAGNOSIS — Z888 Allergy status to other drugs, medicaments and biological substances status: Secondary | ICD-10-CM | POA: Diagnosis not present

## 2017-07-07 DIAGNOSIS — G8918 Other acute postprocedural pain: Secondary | ICD-10-CM | POA: Diagnosis not present

## 2017-07-07 DIAGNOSIS — Z885 Allergy status to narcotic agent status: Secondary | ICD-10-CM | POA: Diagnosis not present

## 2017-07-07 DIAGNOSIS — M25512 Pain in left shoulder: Secondary | ICD-10-CM | POA: Diagnosis not present

## 2017-07-07 DIAGNOSIS — M75122 Complete rotator cuff tear or rupture of left shoulder, not specified as traumatic: Secondary | ICD-10-CM | POA: Diagnosis not present

## 2017-07-07 DIAGNOSIS — Z471 Aftercare following joint replacement surgery: Secondary | ICD-10-CM | POA: Diagnosis not present

## 2017-07-07 DIAGNOSIS — I10 Essential (primary) hypertension: Secondary | ICD-10-CM | POA: Diagnosis not present

## 2017-07-07 DIAGNOSIS — M12812 Other specific arthropathies, not elsewhere classified, left shoulder: Secondary | ICD-10-CM | POA: Diagnosis not present

## 2017-07-11 DIAGNOSIS — M19012 Primary osteoarthritis, left shoulder: Secondary | ICD-10-CM | POA: Diagnosis not present

## 2017-07-11 DIAGNOSIS — Z4889 Encounter for other specified surgical aftercare: Secondary | ICD-10-CM | POA: Diagnosis not present

## 2017-07-11 DIAGNOSIS — M810 Age-related osteoporosis without current pathological fracture: Secondary | ICD-10-CM | POA: Diagnosis not present

## 2017-07-11 DIAGNOSIS — R262 Difficulty in walking, not elsewhere classified: Secondary | ICD-10-CM | POA: Diagnosis not present

## 2017-07-11 DIAGNOSIS — I10 Essential (primary) hypertension: Secondary | ICD-10-CM | POA: Diagnosis not present

## 2017-07-11 DIAGNOSIS — I1310 Hypertensive heart and chronic kidney disease without heart failure, with stage 1 through stage 4 chronic kidney disease, or unspecified chronic kidney disease: Secondary | ICD-10-CM | POA: Diagnosis not present

## 2017-07-11 DIAGNOSIS — Z96612 Presence of left artificial shoulder joint: Secondary | ICD-10-CM | POA: Diagnosis not present

## 2017-07-11 DIAGNOSIS — K219 Gastro-esophageal reflux disease without esophagitis: Secondary | ICD-10-CM | POA: Diagnosis not present

## 2017-07-11 DIAGNOSIS — Z471 Aftercare following joint replacement surgery: Secondary | ICD-10-CM | POA: Diagnosis not present

## 2017-07-11 DIAGNOSIS — G8918 Other acute postprocedural pain: Secondary | ICD-10-CM | POA: Diagnosis not present

## 2017-07-11 HISTORY — PX: REVERSE TOTAL SHOULDER ARTHROPLASTY: SHX2344

## 2017-07-15 ENCOUNTER — Other Ambulatory Visit: Payer: Self-pay | Admitting: *Deleted

## 2017-07-15 NOTE — Patient Outreach (Signed)
[place] [facility]) Care Management  07/15/2017  Yisell Sprunger 25-Aug-1941 [id number]  [employer] referral; discharged from inpatient admission from [facility] 07/14/2017:  Telephone call attempt x 1; left HIPPA compliant voice mail requesting call back.   Plan: Follow up.  Sherrin Daisy, RN BSN [place] Management Coordinator [facility] Care Management  [phone]

## 2017-07-16 ENCOUNTER — Other Ambulatory Visit: Payer: Self-pay | Admitting: *Deleted

## 2017-07-16 NOTE — Patient Outreach (Signed)
[place] [facility]) Care Management  07/16/2017  Keeanna Villafranca 1940-11-25 [id number]  Per history review patient was admitted to [facility] 07/07/17 and discharged to [facility] 07/11/2017.  Telephone call to [facility] in [place]. Spoke with Benjamine Mola in admissions who advised that patient was currently a patient in facility.  States patient  is receiving rehabilitative services (PT/OT) and the plan is for her to return to her home.  Plan: Will refer to care management to assign to Clinical Social Worker.   Sherrin Daisy, RN BSN [place] Management Coordinator [facility] Care Management  [phone]

## 2017-07-17 DIAGNOSIS — G8918 Other acute postprocedural pain: Secondary | ICD-10-CM | POA: Diagnosis not present

## 2017-07-17 DIAGNOSIS — R262 Difficulty in walking, not elsewhere classified: Secondary | ICD-10-CM | POA: Diagnosis not present

## 2017-07-17 DIAGNOSIS — Z4889 Encounter for other specified surgical aftercare: Secondary | ICD-10-CM | POA: Diagnosis not present

## 2017-07-17 DIAGNOSIS — I1310 Hypertensive heart and chronic kidney disease without heart failure, with stage 1 through stage 4 chronic kidney disease, or unspecified chronic kidney disease: Secondary | ICD-10-CM | POA: Diagnosis not present

## 2017-07-21 ENCOUNTER — Other Ambulatory Visit: Payer: Self-pay | Admitting: *Deleted

## 2017-07-23 NOTE — Patient Outreach (Signed)
[place] [facility]) Care Management  07/21/2017   Cindy Gordon Jun 14, 1941 [id number]   CSW attempted to visit patient at [facility] but she was not available. CSW left [facility] Packet and CSW contact #/card and will follow up for updates/status and to introduce [facility] programs, CSW role, etc.   Eduard Clos, MSW, [place] Worker  [place] [phone]

## 2017-08-03 ENCOUNTER — Other Ambulatory Visit: Payer: Self-pay | Admitting: *Deleted

## 2017-08-03 NOTE — Patient Outreach (Signed)
[facility] [place] [facility]) Care Management  08/03/2017  Cindy Gordon Nov 15, 1940 [id number]  Referral -Mcarthur Rossetti; patient discharged from [facility] 07/28/2017:  Telephone call to patient who was advised of reason for call & [facility] care management services. HIPPA verification received.   Patient voices that she is currently home following rehabilitation at skilled facility.    States currently a someone  is staying with her. Voices that she is getting around well & has all of her medications & is taking them as prescribed.  States she has no needs for case management at this time. Patient consents to receive [facility] contact information.   Plan: Send [facility] contact information. Send to care management assistant to close case.   Sherrin Daisy, RN BSN [place] Management Coordinator [facility] Care Management  [phone]

## 2017-08-04 ENCOUNTER — Encounter: Payer: Self-pay | Admitting: *Deleted

## 2017-08-04 ENCOUNTER — Other Ambulatory Visit: Payer: Self-pay | Admitting: *Deleted

## 2017-08-04 NOTE — Patient Outreach (Signed)
[facility] [facility]) Care Management  08/04/2017  Cindy Gordon 11-Mar-1941 [id number]   [place] contacted patient today by phone who reports she was released from SNF "Saturday I think it was".  CSW re-introduced self and role with [facility].  Patient denies any needs at this time; stating " I am independent and doing well".  She reports no HH services needed;  "my granddaughter is staying with me". She plans to see her PCP on 10/10.  She declines any needs but appreciates the support.  CSW will close CSW referral and advise [facility] team of above.   Eduard Clos, MSW, [place] Worker  [place] [phone]

## 2017-08-07 DIAGNOSIS — E559 Vitamin D deficiency, unspecified: Secondary | ICD-10-CM | POA: Diagnosis not present

## 2017-08-07 DIAGNOSIS — E782 Mixed hyperlipidemia: Secondary | ICD-10-CM | POA: Diagnosis not present

## 2017-08-07 DIAGNOSIS — E038 Other specified hypothyroidism: Secondary | ICD-10-CM | POA: Diagnosis not present

## 2017-08-07 DIAGNOSIS — M797 Fibromyalgia: Secondary | ICD-10-CM | POA: Diagnosis not present

## 2017-08-07 DIAGNOSIS — R7301 Impaired fasting glucose: Secondary | ICD-10-CM | POA: Diagnosis not present

## 2017-08-07 DIAGNOSIS — I10 Essential (primary) hypertension: Secondary | ICD-10-CM | POA: Diagnosis not present

## 2017-08-07 DIAGNOSIS — M25512 Pain in left shoulder: Secondary | ICD-10-CM | POA: Diagnosis not present

## 2017-08-13 DIAGNOSIS — M25512 Pain in left shoulder: Secondary | ICD-10-CM | POA: Diagnosis not present

## 2017-08-13 DIAGNOSIS — M12812 Other specific arthropathies, not elsewhere classified, left shoulder: Secondary | ICD-10-CM | POA: Diagnosis not present

## 2017-08-17 DIAGNOSIS — M12812 Other specific arthropathies, not elsewhere classified, left shoulder: Secondary | ICD-10-CM | POA: Diagnosis not present

## 2017-08-17 DIAGNOSIS — M25512 Pain in left shoulder: Secondary | ICD-10-CM | POA: Diagnosis not present

## 2017-08-20 DIAGNOSIS — M25512 Pain in left shoulder: Secondary | ICD-10-CM | POA: Diagnosis not present

## 2017-08-20 DIAGNOSIS — M12812 Other specific arthropathies, not elsewhere classified, left shoulder: Secondary | ICD-10-CM | POA: Diagnosis not present

## 2017-08-27 DIAGNOSIS — M12812 Other specific arthropathies, not elsewhere classified, left shoulder: Secondary | ICD-10-CM | POA: Diagnosis not present

## 2017-08-27 DIAGNOSIS — M25512 Pain in left shoulder: Secondary | ICD-10-CM | POA: Diagnosis not present

## 2017-09-01 DIAGNOSIS — M25512 Pain in left shoulder: Secondary | ICD-10-CM | POA: Diagnosis not present

## 2017-09-01 DIAGNOSIS — M12812 Other specific arthropathies, not elsewhere classified, left shoulder: Secondary | ICD-10-CM | POA: Diagnosis not present

## 2017-09-03 DIAGNOSIS — M25512 Pain in left shoulder: Secondary | ICD-10-CM | POA: Diagnosis not present

## 2017-09-03 DIAGNOSIS — M12812 Other specific arthropathies, not elsewhere classified, left shoulder: Secondary | ICD-10-CM | POA: Diagnosis not present

## 2017-09-08 DIAGNOSIS — M25522 Pain in left elbow: Secondary | ICD-10-CM | POA: Diagnosis not present

## 2017-09-08 DIAGNOSIS — S62102A Fracture of unspecified carpal bone, left wrist, initial encounter for closed fracture: Secondary | ICD-10-CM | POA: Diagnosis not present

## 2017-09-08 DIAGNOSIS — S4992XA Unspecified injury of left shoulder and upper arm, initial encounter: Secondary | ICD-10-CM | POA: Diagnosis not present

## 2017-09-08 DIAGNOSIS — S59902A Unspecified injury of left elbow, initial encounter: Secondary | ICD-10-CM | POA: Diagnosis not present

## 2017-09-08 DIAGNOSIS — G5602 Carpal tunnel syndrome, left upper limb: Secondary | ICD-10-CM | POA: Diagnosis not present

## 2017-09-08 DIAGNOSIS — M25512 Pain in left shoulder: Secondary | ICD-10-CM | POA: Diagnosis not present

## 2017-09-08 DIAGNOSIS — T148XXA Other injury of unspecified body region, initial encounter: Secondary | ICD-10-CM | POA: Diagnosis not present

## 2017-09-08 DIAGNOSIS — S52515A Nondisplaced fracture of left radial styloid process, initial encounter for closed fracture: Secondary | ICD-10-CM | POA: Diagnosis not present

## 2017-09-09 DIAGNOSIS — S52515A Nondisplaced fracture of left radial styloid process, initial encounter for closed fracture: Secondary | ICD-10-CM | POA: Diagnosis not present

## 2017-09-09 DIAGNOSIS — M25532 Pain in left wrist: Secondary | ICD-10-CM | POA: Diagnosis not present

## 2017-09-10 DIAGNOSIS — M25512 Pain in left shoulder: Secondary | ICD-10-CM | POA: Diagnosis not present

## 2017-09-10 DIAGNOSIS — M12812 Other specific arthropathies, not elsewhere classified, left shoulder: Secondary | ICD-10-CM | POA: Diagnosis not present

## 2017-09-12 DIAGNOSIS — S62002A Unspecified fracture of navicular [scaphoid] bone of left wrist, initial encounter for closed fracture: Secondary | ICD-10-CM | POA: Diagnosis not present

## 2017-09-16 DIAGNOSIS — S52515A Nondisplaced fracture of left radial styloid process, initial encounter for closed fracture: Secondary | ICD-10-CM | POA: Diagnosis not present

## 2017-09-23 DIAGNOSIS — M12812 Other specific arthropathies, not elsewhere classified, left shoulder: Secondary | ICD-10-CM | POA: Diagnosis not present

## 2017-09-23 DIAGNOSIS — M25512 Pain in left shoulder: Secondary | ICD-10-CM | POA: Diagnosis not present

## 2017-09-24 DIAGNOSIS — M1711 Unilateral primary osteoarthritis, right knee: Secondary | ICD-10-CM | POA: Diagnosis not present

## 2017-09-28 DIAGNOSIS — M25512 Pain in left shoulder: Secondary | ICD-10-CM | POA: Diagnosis not present

## 2017-09-28 DIAGNOSIS — M12812 Other specific arthropathies, not elsewhere classified, left shoulder: Secondary | ICD-10-CM | POA: Diagnosis not present

## 2017-10-05 DIAGNOSIS — M12812 Other specific arthropathies, not elsewhere classified, left shoulder: Secondary | ICD-10-CM | POA: Diagnosis not present

## 2017-10-05 DIAGNOSIS — M25512 Pain in left shoulder: Secondary | ICD-10-CM | POA: Diagnosis not present

## 2017-10-05 DIAGNOSIS — S52515A Nondisplaced fracture of left radial styloid process, initial encounter for closed fracture: Secondary | ICD-10-CM | POA: Diagnosis not present

## 2017-10-06 DIAGNOSIS — E034 Atrophy of thyroid (acquired): Secondary | ICD-10-CM | POA: Diagnosis not present

## 2017-10-07 DIAGNOSIS — M12812 Other specific arthropathies, not elsewhere classified, left shoulder: Secondary | ICD-10-CM | POA: Diagnosis not present

## 2017-10-07 DIAGNOSIS — M25512 Pain in left shoulder: Secondary | ICD-10-CM | POA: Diagnosis not present

## 2017-10-12 DIAGNOSIS — M12812 Other specific arthropathies, not elsewhere classified, left shoulder: Secondary | ICD-10-CM | POA: Diagnosis not present

## 2017-10-12 DIAGNOSIS — M25512 Pain in left shoulder: Secondary | ICD-10-CM | POA: Diagnosis not present

## 2017-10-14 DIAGNOSIS — M12812 Other specific arthropathies, not elsewhere classified, left shoulder: Secondary | ICD-10-CM | POA: Diagnosis not present

## 2017-10-14 DIAGNOSIS — M25512 Pain in left shoulder: Secondary | ICD-10-CM | POA: Diagnosis not present

## 2017-10-26 DIAGNOSIS — M25512 Pain in left shoulder: Secondary | ICD-10-CM | POA: Diagnosis not present

## 2017-10-26 DIAGNOSIS — N3941 Urge incontinence: Secondary | ICD-10-CM | POA: Diagnosis not present

## 2017-10-26 DIAGNOSIS — M12812 Other specific arthropathies, not elsewhere classified, left shoulder: Secondary | ICD-10-CM | POA: Diagnosis not present

## 2017-10-26 DIAGNOSIS — Z6831 Body mass index (BMI) 31.0-31.9, adult: Secondary | ICD-10-CM | POA: Diagnosis not present

## 2017-10-26 DIAGNOSIS — Z0000 Encounter for general adult medical examination without abnormal findings: Secondary | ICD-10-CM | POA: Diagnosis not present

## 2017-10-26 DIAGNOSIS — E668 Other obesity: Secondary | ICD-10-CM | POA: Diagnosis not present

## 2017-11-02 DIAGNOSIS — M12812 Other specific arthropathies, not elsewhere classified, left shoulder: Secondary | ICD-10-CM | POA: Diagnosis not present

## 2017-11-02 DIAGNOSIS — M25512 Pain in left shoulder: Secondary | ICD-10-CM | POA: Diagnosis not present

## 2017-11-05 DIAGNOSIS — S52515A Nondisplaced fracture of left radial styloid process, initial encounter for closed fracture: Secondary | ICD-10-CM | POA: Diagnosis not present

## 2017-11-10 HISTORY — PX: CATARACT EXTRACTION: SUR2

## 2017-11-13 DIAGNOSIS — M12812 Other specific arthropathies, not elsewhere classified, left shoulder: Secondary | ICD-10-CM | POA: Diagnosis not present

## 2017-11-13 DIAGNOSIS — M25512 Pain in left shoulder: Secondary | ICD-10-CM | POA: Diagnosis not present

## 2017-11-19 DIAGNOSIS — M12812 Other specific arthropathies, not elsewhere classified, left shoulder: Secondary | ICD-10-CM | POA: Diagnosis not present

## 2017-11-19 DIAGNOSIS — I10 Essential (primary) hypertension: Secondary | ICD-10-CM | POA: Diagnosis not present

## 2017-11-19 DIAGNOSIS — M797 Fibromyalgia: Secondary | ICD-10-CM | POA: Diagnosis not present

## 2017-11-19 DIAGNOSIS — N3941 Urge incontinence: Secondary | ICD-10-CM | POA: Diagnosis not present

## 2017-11-19 DIAGNOSIS — E782 Mixed hyperlipidemia: Secondary | ICD-10-CM | POA: Diagnosis not present

## 2017-11-19 DIAGNOSIS — E559 Vitamin D deficiency, unspecified: Secondary | ICD-10-CM | POA: Diagnosis not present

## 2017-11-19 DIAGNOSIS — M25512 Pain in left shoulder: Secondary | ICD-10-CM | POA: Diagnosis not present

## 2017-11-19 DIAGNOSIS — R7301 Impaired fasting glucose: Secondary | ICD-10-CM | POA: Diagnosis not present

## 2017-11-20 DIAGNOSIS — M12812 Other specific arthropathies, not elsewhere classified, left shoulder: Secondary | ICD-10-CM | POA: Diagnosis not present

## 2017-11-20 DIAGNOSIS — M25512 Pain in left shoulder: Secondary | ICD-10-CM | POA: Diagnosis not present

## 2017-11-23 DIAGNOSIS — M12812 Other specific arthropathies, not elsewhere classified, left shoulder: Secondary | ICD-10-CM | POA: Diagnosis not present

## 2017-11-23 DIAGNOSIS — M25512 Pain in left shoulder: Secondary | ICD-10-CM | POA: Diagnosis not present

## 2017-11-25 DIAGNOSIS — N3281 Overactive bladder: Secondary | ICD-10-CM | POA: Diagnosis not present

## 2017-11-25 DIAGNOSIS — M25512 Pain in left shoulder: Secondary | ICD-10-CM | POA: Diagnosis not present

## 2017-11-25 DIAGNOSIS — N390 Urinary tract infection, site not specified: Secondary | ICD-10-CM | POA: Diagnosis not present

## 2017-11-25 DIAGNOSIS — M12812 Other specific arthropathies, not elsewhere classified, left shoulder: Secondary | ICD-10-CM | POA: Diagnosis not present

## 2017-11-25 DIAGNOSIS — R339 Retention of urine, unspecified: Secondary | ICD-10-CM | POA: Diagnosis not present

## 2017-12-02 DIAGNOSIS — M25512 Pain in left shoulder: Secondary | ICD-10-CM | POA: Diagnosis not present

## 2017-12-02 DIAGNOSIS — M12812 Other specific arthropathies, not elsewhere classified, left shoulder: Secondary | ICD-10-CM | POA: Diagnosis not present

## 2017-12-08 DIAGNOSIS — M12812 Other specific arthropathies, not elsewhere classified, left shoulder: Secondary | ICD-10-CM | POA: Diagnosis not present

## 2017-12-08 DIAGNOSIS — M25512 Pain in left shoulder: Secondary | ICD-10-CM | POA: Diagnosis not present

## 2017-12-09 DIAGNOSIS — M25512 Pain in left shoulder: Secondary | ICD-10-CM | POA: Diagnosis not present

## 2017-12-09 DIAGNOSIS — Z96612 Presence of left artificial shoulder joint: Secondary | ICD-10-CM | POA: Diagnosis not present

## 2017-12-10 DIAGNOSIS — M25512 Pain in left shoulder: Secondary | ICD-10-CM | POA: Diagnosis not present

## 2017-12-10 DIAGNOSIS — R339 Retention of urine, unspecified: Secondary | ICD-10-CM | POA: Diagnosis not present

## 2017-12-10 DIAGNOSIS — R3915 Urgency of urination: Secondary | ICD-10-CM | POA: Diagnosis not present

## 2017-12-10 DIAGNOSIS — M12812 Other specific arthropathies, not elsewhere classified, left shoulder: Secondary | ICD-10-CM | POA: Diagnosis not present

## 2017-12-11 DIAGNOSIS — Z1231 Encounter for screening mammogram for malignant neoplasm of breast: Secondary | ICD-10-CM | POA: Diagnosis not present

## 2017-12-16 DIAGNOSIS — M25512 Pain in left shoulder: Secondary | ICD-10-CM | POA: Diagnosis not present

## 2017-12-16 DIAGNOSIS — Z96612 Presence of left artificial shoulder joint: Secondary | ICD-10-CM | POA: Diagnosis not present

## 2017-12-16 DIAGNOSIS — S4992XA Unspecified injury of left shoulder and upper arm, initial encounter: Secondary | ICD-10-CM | POA: Diagnosis not present

## 2017-12-17 DIAGNOSIS — M25512 Pain in left shoulder: Secondary | ICD-10-CM | POA: Diagnosis not present

## 2017-12-17 DIAGNOSIS — Z96612 Presence of left artificial shoulder joint: Secondary | ICD-10-CM | POA: Diagnosis not present

## 2017-12-17 DIAGNOSIS — M12812 Other specific arthropathies, not elsewhere classified, left shoulder: Secondary | ICD-10-CM | POA: Diagnosis not present

## 2017-12-21 DIAGNOSIS — M12812 Other specific arthropathies, not elsewhere classified, left shoulder: Secondary | ICD-10-CM | POA: Diagnosis not present

## 2017-12-21 DIAGNOSIS — M25512 Pain in left shoulder: Secondary | ICD-10-CM | POA: Diagnosis not present

## 2017-12-22 DIAGNOSIS — Z96612 Presence of left artificial shoulder joint: Secondary | ICD-10-CM | POA: Diagnosis not present

## 2017-12-22 DIAGNOSIS — M25412 Effusion, left shoulder: Secondary | ICD-10-CM | POA: Diagnosis not present

## 2017-12-24 DIAGNOSIS — M25512 Pain in left shoulder: Secondary | ICD-10-CM | POA: Diagnosis not present

## 2017-12-24 DIAGNOSIS — M12812 Other specific arthropathies, not elsewhere classified, left shoulder: Secondary | ICD-10-CM | POA: Diagnosis not present

## 2017-12-30 DIAGNOSIS — M12812 Other specific arthropathies, not elsewhere classified, left shoulder: Secondary | ICD-10-CM | POA: Diagnosis not present

## 2017-12-30 DIAGNOSIS — M25512 Pain in left shoulder: Secondary | ICD-10-CM | POA: Diagnosis not present

## 2018-01-04 DIAGNOSIS — M25512 Pain in left shoulder: Secondary | ICD-10-CM | POA: Diagnosis not present

## 2018-01-04 DIAGNOSIS — M12812 Other specific arthropathies, not elsewhere classified, left shoulder: Secondary | ICD-10-CM | POA: Diagnosis not present

## 2018-01-07 DIAGNOSIS — M25512 Pain in left shoulder: Secondary | ICD-10-CM | POA: Diagnosis not present

## 2018-01-07 DIAGNOSIS — M12812 Other specific arthropathies, not elsewhere classified, left shoulder: Secondary | ICD-10-CM | POA: Diagnosis not present

## 2018-01-11 DIAGNOSIS — M25512 Pain in left shoulder: Secondary | ICD-10-CM | POA: Diagnosis not present

## 2018-01-11 DIAGNOSIS — M12812 Other specific arthropathies, not elsewhere classified, left shoulder: Secondary | ICD-10-CM | POA: Diagnosis not present

## 2018-01-12 DIAGNOSIS — N390 Urinary tract infection, site not specified: Secondary | ICD-10-CM | POA: Diagnosis not present

## 2018-01-12 DIAGNOSIS — N3281 Overactive bladder: Secondary | ICD-10-CM | POA: Diagnosis not present

## 2018-01-12 DIAGNOSIS — R351 Nocturia: Secondary | ICD-10-CM | POA: Diagnosis not present

## 2018-01-18 DIAGNOSIS — M12812 Other specific arthropathies, not elsewhere classified, left shoulder: Secondary | ICD-10-CM | POA: Diagnosis not present

## 2018-01-18 DIAGNOSIS — M25512 Pain in left shoulder: Secondary | ICD-10-CM | POA: Diagnosis not present

## 2018-01-25 DIAGNOSIS — M1711 Unilateral primary osteoarthritis, right knee: Secondary | ICD-10-CM | POA: Diagnosis not present

## 2018-02-15 DIAGNOSIS — R339 Retention of urine, unspecified: Secondary | ICD-10-CM | POA: Diagnosis not present

## 2018-02-15 DIAGNOSIS — N390 Urinary tract infection, site not specified: Secondary | ICD-10-CM | POA: Diagnosis not present

## 2018-02-15 DIAGNOSIS — R351 Nocturia: Secondary | ICD-10-CM | POA: Diagnosis not present

## 2018-02-19 DIAGNOSIS — M797 Fibromyalgia: Secondary | ICD-10-CM | POA: Diagnosis not present

## 2018-02-19 DIAGNOSIS — L600 Ingrowing nail: Secondary | ICD-10-CM | POA: Diagnosis not present

## 2018-02-19 DIAGNOSIS — I10 Essential (primary) hypertension: Secondary | ICD-10-CM | POA: Diagnosis not present

## 2018-02-19 DIAGNOSIS — E782 Mixed hyperlipidemia: Secondary | ICD-10-CM | POA: Diagnosis not present

## 2018-02-19 DIAGNOSIS — R7301 Impaired fasting glucose: Secondary | ICD-10-CM | POA: Diagnosis not present

## 2018-02-19 DIAGNOSIS — N3941 Urge incontinence: Secondary | ICD-10-CM | POA: Diagnosis not present

## 2018-02-19 DIAGNOSIS — E559 Vitamin D deficiency, unspecified: Secondary | ICD-10-CM | POA: Diagnosis not present

## 2018-02-19 DIAGNOSIS — G5603 Carpal tunnel syndrome, bilateral upper limbs: Secondary | ICD-10-CM | POA: Diagnosis not present

## 2018-02-19 DIAGNOSIS — D513 Other dietary vitamin B12 deficiency anemia: Secondary | ICD-10-CM | POA: Diagnosis not present

## 2018-02-19 DIAGNOSIS — E038 Other specified hypothyroidism: Secondary | ICD-10-CM | POA: Diagnosis not present

## 2018-03-15 DIAGNOSIS — N3281 Overactive bladder: Secondary | ICD-10-CM | POA: Diagnosis not present

## 2018-03-15 DIAGNOSIS — R351 Nocturia: Secondary | ICD-10-CM | POA: Diagnosis not present

## 2018-03-15 DIAGNOSIS — E119 Type 2 diabetes mellitus without complications: Secondary | ICD-10-CM | POA: Diagnosis not present

## 2018-03-16 DIAGNOSIS — R339 Retention of urine, unspecified: Secondary | ICD-10-CM | POA: Diagnosis not present

## 2018-04-02 DIAGNOSIS — Z96612 Presence of left artificial shoulder joint: Secondary | ICD-10-CM | POA: Diagnosis not present

## 2018-04-08 DIAGNOSIS — E119 Type 2 diabetes mellitus without complications: Secondary | ICD-10-CM | POA: Diagnosis not present

## 2018-04-12 DIAGNOSIS — R351 Nocturia: Secondary | ICD-10-CM | POA: Diagnosis not present

## 2018-04-12 DIAGNOSIS — R339 Retention of urine, unspecified: Secondary | ICD-10-CM | POA: Diagnosis not present

## 2018-04-13 DIAGNOSIS — H00011 Hordeolum externum right upper eyelid: Secondary | ICD-10-CM | POA: Diagnosis not present

## 2018-04-13 DIAGNOSIS — H25043 Posterior subcapsular polar age-related cataract, bilateral: Secondary | ICD-10-CM | POA: Diagnosis not present

## 2018-04-27 DIAGNOSIS — M1711 Unilateral primary osteoarthritis, right knee: Secondary | ICD-10-CM | POA: Diagnosis not present

## 2018-05-03 DIAGNOSIS — E119 Type 2 diabetes mellitus without complications: Secondary | ICD-10-CM | POA: Diagnosis not present

## 2018-05-21 DIAGNOSIS — H2511 Age-related nuclear cataract, right eye: Secondary | ICD-10-CM | POA: Diagnosis not present

## 2018-05-21 DIAGNOSIS — Z01818 Encounter for other preprocedural examination: Secondary | ICD-10-CM | POA: Diagnosis not present

## 2018-05-27 DIAGNOSIS — E559 Vitamin D deficiency, unspecified: Secondary | ICD-10-CM | POA: Diagnosis not present

## 2018-05-27 DIAGNOSIS — D513 Other dietary vitamin B12 deficiency anemia: Secondary | ICD-10-CM | POA: Diagnosis not present

## 2018-05-27 DIAGNOSIS — I10 Essential (primary) hypertension: Secondary | ICD-10-CM | POA: Diagnosis not present

## 2018-05-27 DIAGNOSIS — E782 Mixed hyperlipidemia: Secondary | ICD-10-CM | POA: Diagnosis not present

## 2018-05-27 DIAGNOSIS — E1142 Type 2 diabetes mellitus with diabetic polyneuropathy: Secondary | ICD-10-CM | POA: Diagnosis not present

## 2018-05-27 DIAGNOSIS — R0789 Other chest pain: Secondary | ICD-10-CM | POA: Diagnosis not present

## 2018-05-27 DIAGNOSIS — E668 Other obesity: Secondary | ICD-10-CM | POA: Diagnosis not present

## 2018-05-27 DIAGNOSIS — M797 Fibromyalgia: Secondary | ICD-10-CM | POA: Diagnosis not present

## 2018-05-27 DIAGNOSIS — N3941 Urge incontinence: Secondary | ICD-10-CM | POA: Diagnosis not present

## 2018-06-22 DIAGNOSIS — I10 Essential (primary) hypertension: Secondary | ICD-10-CM | POA: Diagnosis not present

## 2018-06-22 DIAGNOSIS — H259 Unspecified age-related cataract: Secondary | ICD-10-CM | POA: Diagnosis not present

## 2018-06-22 DIAGNOSIS — E785 Hyperlipidemia, unspecified: Secondary | ICD-10-CM | POA: Diagnosis not present

## 2018-06-22 DIAGNOSIS — E1140 Type 2 diabetes mellitus with diabetic neuropathy, unspecified: Secondary | ICD-10-CM | POA: Diagnosis not present

## 2018-06-22 DIAGNOSIS — H2511 Age-related nuclear cataract, right eye: Secondary | ICD-10-CM | POA: Diagnosis not present

## 2018-06-22 DIAGNOSIS — M1990 Unspecified osteoarthritis, unspecified site: Secondary | ICD-10-CM | POA: Diagnosis not present

## 2018-06-22 DIAGNOSIS — G4733 Obstructive sleep apnea (adult) (pediatric): Secondary | ICD-10-CM | POA: Diagnosis not present

## 2018-06-22 DIAGNOSIS — E039 Hypothyroidism, unspecified: Secondary | ICD-10-CM | POA: Diagnosis not present

## 2018-06-22 DIAGNOSIS — E1136 Type 2 diabetes mellitus with diabetic cataract: Secondary | ICD-10-CM | POA: Diagnosis not present

## 2018-06-22 DIAGNOSIS — I2510 Atherosclerotic heart disease of native coronary artery without angina pectoris: Secondary | ICD-10-CM | POA: Diagnosis not present

## 2018-06-28 DIAGNOSIS — I10 Essential (primary) hypertension: Secondary | ICD-10-CM | POA: Diagnosis not present

## 2018-07-26 DIAGNOSIS — M5416 Radiculopathy, lumbar region: Secondary | ICD-10-CM | POA: Diagnosis not present

## 2018-08-12 DIAGNOSIS — Z96612 Presence of left artificial shoulder joint: Secondary | ICD-10-CM | POA: Diagnosis not present

## 2018-08-23 DIAGNOSIS — M5416 Radiculopathy, lumbar region: Secondary | ICD-10-CM | POA: Diagnosis not present

## 2018-08-23 DIAGNOSIS — M545 Low back pain: Secondary | ICD-10-CM | POA: Diagnosis not present

## 2018-08-26 DIAGNOSIS — I10 Essential (primary) hypertension: Secondary | ICD-10-CM | POA: Diagnosis not present

## 2018-08-26 DIAGNOSIS — M5416 Radiculopathy, lumbar region: Secondary | ICD-10-CM | POA: Diagnosis not present

## 2018-08-30 DIAGNOSIS — M25562 Pain in left knee: Secondary | ICD-10-CM | POA: Diagnosis not present

## 2018-08-31 DIAGNOSIS — E782 Mixed hyperlipidemia: Secondary | ICD-10-CM | POA: Diagnosis not present

## 2018-08-31 DIAGNOSIS — I10 Essential (primary) hypertension: Secondary | ICD-10-CM | POA: Diagnosis not present

## 2018-08-31 DIAGNOSIS — N3941 Urge incontinence: Secondary | ICD-10-CM | POA: Diagnosis not present

## 2018-08-31 DIAGNOSIS — Z23 Encounter for immunization: Secondary | ICD-10-CM | POA: Diagnosis not present

## 2018-08-31 DIAGNOSIS — Z6830 Body mass index (BMI) 30.0-30.9, adult: Secondary | ICD-10-CM | POA: Diagnosis not present

## 2018-08-31 DIAGNOSIS — R1314 Dysphagia, pharyngoesophageal phase: Secondary | ICD-10-CM | POA: Diagnosis not present

## 2018-08-31 DIAGNOSIS — E668 Other obesity: Secondary | ICD-10-CM | POA: Diagnosis not present

## 2018-08-31 DIAGNOSIS — M797 Fibromyalgia: Secondary | ICD-10-CM | POA: Diagnosis not present

## 2018-08-31 DIAGNOSIS — E1169 Type 2 diabetes mellitus with other specified complication: Secondary | ICD-10-CM | POA: Diagnosis not present

## 2018-08-31 DIAGNOSIS — E559 Vitamin D deficiency, unspecified: Secondary | ICD-10-CM | POA: Diagnosis not present

## 2018-09-07 DIAGNOSIS — M5416 Radiculopathy, lumbar region: Secondary | ICD-10-CM | POA: Diagnosis not present

## 2018-09-14 DIAGNOSIS — R1314 Dysphagia, pharyngoesophageal phase: Secondary | ICD-10-CM | POA: Diagnosis not present

## 2018-09-30 DIAGNOSIS — M5416 Radiculopathy, lumbar region: Secondary | ICD-10-CM | POA: Diagnosis not present

## 2018-10-21 DIAGNOSIS — M25562 Pain in left knee: Secondary | ICD-10-CM | POA: Diagnosis not present

## 2018-10-21 DIAGNOSIS — M5416 Radiculopathy, lumbar region: Secondary | ICD-10-CM | POA: Diagnosis not present

## 2018-10-21 DIAGNOSIS — M25561 Pain in right knee: Secondary | ICD-10-CM | POA: Diagnosis not present

## 2018-10-27 DIAGNOSIS — Z6829 Body mass index (BMI) 29.0-29.9, adult: Secondary | ICD-10-CM | POA: Diagnosis not present

## 2018-10-27 DIAGNOSIS — Z0000 Encounter for general adult medical examination without abnormal findings: Secondary | ICD-10-CM | POA: Diagnosis not present

## 2018-11-16 DIAGNOSIS — M25561 Pain in right knee: Secondary | ICD-10-CM | POA: Diagnosis not present

## 2018-11-16 DIAGNOSIS — M25562 Pain in left knee: Secondary | ICD-10-CM | POA: Diagnosis not present

## 2018-12-03 DIAGNOSIS — E559 Vitamin D deficiency, unspecified: Secondary | ICD-10-CM | POA: Diagnosis not present

## 2018-12-03 DIAGNOSIS — E668 Other obesity: Secondary | ICD-10-CM | POA: Diagnosis not present

## 2018-12-03 DIAGNOSIS — N3941 Urge incontinence: Secondary | ICD-10-CM | POA: Diagnosis not present

## 2018-12-03 DIAGNOSIS — E1142 Type 2 diabetes mellitus with diabetic polyneuropathy: Secondary | ICD-10-CM | POA: Diagnosis not present

## 2018-12-03 DIAGNOSIS — M797 Fibromyalgia: Secondary | ICD-10-CM | POA: Diagnosis not present

## 2018-12-03 DIAGNOSIS — I10 Essential (primary) hypertension: Secondary | ICD-10-CM | POA: Diagnosis not present

## 2018-12-03 DIAGNOSIS — E782 Mixed hyperlipidemia: Secondary | ICD-10-CM | POA: Diagnosis not present

## 2018-12-03 DIAGNOSIS — Z6829 Body mass index (BMI) 29.0-29.9, adult: Secondary | ICD-10-CM | POA: Diagnosis not present

## 2018-12-03 DIAGNOSIS — E038 Other specified hypothyroidism: Secondary | ICD-10-CM | POA: Diagnosis not present

## 2018-12-15 DIAGNOSIS — M5416 Radiculopathy, lumbar region: Secondary | ICD-10-CM | POA: Diagnosis not present

## 2018-12-27 DIAGNOSIS — J060 Acute laryngopharyngitis: Secondary | ICD-10-CM | POA: Diagnosis not present

## 2019-01-05 DIAGNOSIS — H25043 Posterior subcapsular polar age-related cataract, bilateral: Secondary | ICD-10-CM | POA: Diagnosis not present

## 2019-01-05 DIAGNOSIS — H2511 Age-related nuclear cataract, right eye: Secondary | ICD-10-CM | POA: Diagnosis not present

## 2019-01-05 DIAGNOSIS — H278 Other specified disorders of lens: Secondary | ICD-10-CM | POA: Diagnosis not present

## 2019-01-05 DIAGNOSIS — H47323 Drusen of optic disc, bilateral: Secondary | ICD-10-CM | POA: Diagnosis not present

## 2019-02-16 ENCOUNTER — Encounter: Payer: Self-pay | Admitting: *Deleted

## 2019-03-15 DIAGNOSIS — E668 Other obesity: Secondary | ICD-10-CM | POA: Diagnosis not present

## 2019-03-15 DIAGNOSIS — E782 Mixed hyperlipidemia: Secondary | ICD-10-CM | POA: Diagnosis not present

## 2019-03-15 DIAGNOSIS — I10 Essential (primary) hypertension: Secondary | ICD-10-CM | POA: Diagnosis not present

## 2019-03-15 DIAGNOSIS — E538 Deficiency of other specified B group vitamins: Secondary | ICD-10-CM | POA: Diagnosis not present

## 2019-03-15 DIAGNOSIS — E038 Other specified hypothyroidism: Secondary | ICD-10-CM | POA: Diagnosis not present

## 2019-03-15 DIAGNOSIS — Z1159 Encounter for screening for other viral diseases: Secondary | ICD-10-CM | POA: Diagnosis not present

## 2019-03-15 DIAGNOSIS — Z6829 Body mass index (BMI) 29.0-29.9, adult: Secondary | ICD-10-CM | POA: Diagnosis not present

## 2019-03-15 DIAGNOSIS — M25532 Pain in left wrist: Secondary | ICD-10-CM | POA: Diagnosis not present

## 2019-03-15 DIAGNOSIS — E1142 Type 2 diabetes mellitus with diabetic polyneuropathy: Secondary | ICD-10-CM | POA: Diagnosis not present

## 2019-03-15 DIAGNOSIS — M797 Fibromyalgia: Secondary | ICD-10-CM | POA: Diagnosis not present

## 2019-03-15 DIAGNOSIS — M25531 Pain in right wrist: Secondary | ICD-10-CM | POA: Diagnosis not present

## 2019-03-16 DIAGNOSIS — E538 Deficiency of other specified B group vitamins: Secondary | ICD-10-CM | POA: Diagnosis not present

## 2019-03-16 DIAGNOSIS — E1142 Type 2 diabetes mellitus with diabetic polyneuropathy: Secondary | ICD-10-CM | POA: Diagnosis not present

## 2019-03-16 DIAGNOSIS — M25532 Pain in left wrist: Secondary | ICD-10-CM | POA: Diagnosis not present

## 2019-03-16 DIAGNOSIS — M25531 Pain in right wrist: Secondary | ICD-10-CM | POA: Diagnosis not present

## 2019-03-16 DIAGNOSIS — E038 Other specified hypothyroidism: Secondary | ICD-10-CM | POA: Diagnosis not present

## 2019-03-17 DIAGNOSIS — M47816 Spondylosis without myelopathy or radiculopathy, lumbar region: Secondary | ICD-10-CM | POA: Diagnosis not present

## 2019-04-06 ENCOUNTER — Other Ambulatory Visit: Payer: Self-pay | Admitting: *Deleted

## 2019-04-06 NOTE — Patient Outreach (Signed)
[place] [facility]) Care Management  04/06/2019  Cindy Gordon 21-Jan-1941 [id number]   Telephone assessment complete according to health risk assessment through [facility].  No needs identified, [facility] information letter, pamphlet, and magnet sent to member.  Will follow up within the next 6 months.   Valente David, [place], MSN [place] [phone]

## 2019-04-15 DIAGNOSIS — B029 Zoster without complications: Secondary | ICD-10-CM | POA: Diagnosis not present

## 2019-04-20 DIAGNOSIS — M25551 Pain in right hip: Secondary | ICD-10-CM | POA: Diagnosis not present

## 2019-04-26 DIAGNOSIS — S7001XA Contusion of right hip, initial encounter: Secondary | ICD-10-CM | POA: Diagnosis not present

## 2019-04-26 DIAGNOSIS — S7002XA Contusion of left hip, initial encounter: Secondary | ICD-10-CM | POA: Diagnosis not present

## 2019-04-26 DIAGNOSIS — R202 Paresthesia of skin: Secondary | ICD-10-CM | POA: Diagnosis not present

## 2019-04-26 DIAGNOSIS — M545 Low back pain: Secondary | ICD-10-CM | POA: Diagnosis not present

## 2019-05-02 DIAGNOSIS — M5416 Radiculopathy, lumbar region: Secondary | ICD-10-CM | POA: Diagnosis not present

## 2019-05-09 ENCOUNTER — Other Ambulatory Visit: Payer: Self-pay | Admitting: *Deleted

## 2019-05-09 NOTE — Patient Outreach (Signed)
[place] [facility]) Care Management  05/09/2019  Derrika Ruffalo 1941/09/05 [id number]   Member will be followed by external care management program.  Will close case at this time, will notify primary MD of transition to external program.  Valente David, RN, MSN [place] Manager [phone]

## 2019-05-18 DIAGNOSIS — M5416 Radiculopathy, lumbar region: Secondary | ICD-10-CM | POA: Diagnosis not present

## 2019-06-07 DIAGNOSIS — M47816 Spondylosis without myelopathy or radiculopathy, lumbar region: Secondary | ICD-10-CM | POA: Diagnosis not present

## 2019-06-29 DIAGNOSIS — M5416 Radiculopathy, lumbar region: Secondary | ICD-10-CM | POA: Diagnosis not present

## 2019-06-29 DIAGNOSIS — E038 Other specified hypothyroidism: Secondary | ICD-10-CM | POA: Diagnosis not present

## 2019-06-29 DIAGNOSIS — Z6829 Body mass index (BMI) 29.0-29.9, adult: Secondary | ICD-10-CM | POA: Diagnosis not present

## 2019-06-29 DIAGNOSIS — E782 Mixed hyperlipidemia: Secondary | ICD-10-CM | POA: Diagnosis not present

## 2019-06-29 DIAGNOSIS — M5431 Sciatica, right side: Secondary | ICD-10-CM | POA: Diagnosis not present

## 2019-06-29 DIAGNOSIS — I10 Essential (primary) hypertension: Secondary | ICD-10-CM | POA: Diagnosis not present

## 2019-06-29 DIAGNOSIS — E1142 Type 2 diabetes mellitus with diabetic polyneuropathy: Secondary | ICD-10-CM | POA: Diagnosis not present

## 2019-06-29 DIAGNOSIS — E668 Other obesity: Secondary | ICD-10-CM | POA: Diagnosis not present

## 2019-06-29 DIAGNOSIS — M797 Fibromyalgia: Secondary | ICD-10-CM | POA: Diagnosis not present

## 2019-07-06 DIAGNOSIS — E782 Mixed hyperlipidemia: Secondary | ICD-10-CM | POA: Diagnosis not present

## 2019-07-06 DIAGNOSIS — E1142 Type 2 diabetes mellitus with diabetic polyneuropathy: Secondary | ICD-10-CM | POA: Diagnosis not present

## 2019-07-06 DIAGNOSIS — E038 Other specified hypothyroidism: Secondary | ICD-10-CM | POA: Diagnosis not present

## 2019-07-06 DIAGNOSIS — I10 Essential (primary) hypertension: Secondary | ICD-10-CM | POA: Diagnosis not present

## 2019-07-15 DIAGNOSIS — M1711 Unilateral primary osteoarthritis, right knee: Secondary | ICD-10-CM | POA: Diagnosis not present

## 2019-07-15 DIAGNOSIS — M7061 Trochanteric bursitis, right hip: Secondary | ICD-10-CM | POA: Diagnosis not present

## 2019-07-26 DIAGNOSIS — Z1231 Encounter for screening mammogram for malignant neoplasm of breast: Secondary | ICD-10-CM | POA: Diagnosis not present

## 2019-07-26 LAB — HM MAMMOGRAPHY: HM Mammogram: NORMAL (ref 0–4)

## 2019-08-01 DIAGNOSIS — M1711 Unilateral primary osteoarthritis, right knee: Secondary | ICD-10-CM | POA: Diagnosis not present

## 2019-08-08 DIAGNOSIS — M1711 Unilateral primary osteoarthritis, right knee: Secondary | ICD-10-CM | POA: Diagnosis not present

## 2019-08-11 ENCOUNTER — Ambulatory Visit: Payer: Medicare HMO | Admitting: *Deleted

## 2019-08-11 DIAGNOSIS — M533 Sacrococcygeal disorders, not elsewhere classified: Secondary | ICD-10-CM | POA: Diagnosis not present

## 2019-08-15 DIAGNOSIS — M1711 Unilateral primary osteoarthritis, right knee: Secondary | ICD-10-CM | POA: Diagnosis not present

## 2019-08-22 DIAGNOSIS — S0003XA Contusion of scalp, initial encounter: Secondary | ICD-10-CM | POA: Diagnosis not present

## 2019-08-22 DIAGNOSIS — S199XXA Unspecified injury of neck, initial encounter: Secondary | ICD-10-CM | POA: Diagnosis not present

## 2019-08-22 DIAGNOSIS — S62001A Unspecified fracture of navicular [scaphoid] bone of right wrist, initial encounter for closed fracture: Secondary | ICD-10-CM | POA: Diagnosis not present

## 2019-08-22 DIAGNOSIS — S52501A Unspecified fracture of the lower end of right radius, initial encounter for closed fracture: Secondary | ICD-10-CM | POA: Diagnosis not present

## 2019-08-22 DIAGNOSIS — Z03818 Encounter for observation for suspected exposure to other biological agents ruled out: Secondary | ICD-10-CM | POA: Diagnosis not present

## 2019-08-22 DIAGNOSIS — S71152A Open bite, left thigh, initial encounter: Secondary | ICD-10-CM | POA: Diagnosis not present

## 2019-08-22 DIAGNOSIS — R0902 Hypoxemia: Secondary | ICD-10-CM | POA: Diagnosis not present

## 2019-08-22 DIAGNOSIS — S71111A Laceration without foreign body, right thigh, initial encounter: Secondary | ICD-10-CM | POA: Diagnosis not present

## 2019-08-22 DIAGNOSIS — S81851A Open bite, right lower leg, initial encounter: Secondary | ICD-10-CM | POA: Diagnosis not present

## 2019-08-22 DIAGNOSIS — R9431 Abnormal electrocardiogram [ECG] [EKG]: Secondary | ICD-10-CM | POA: Diagnosis not present

## 2019-08-22 DIAGNOSIS — S7011XA Contusion of right thigh, initial encounter: Secondary | ICD-10-CM | POA: Diagnosis not present

## 2019-08-22 DIAGNOSIS — S5291XA Unspecified fracture of right forearm, initial encounter for closed fracture: Secondary | ICD-10-CM | POA: Diagnosis not present

## 2019-08-22 DIAGNOSIS — M25531 Pain in right wrist: Secondary | ICD-10-CM | POA: Diagnosis not present

## 2019-08-22 DIAGNOSIS — R4020 Unspecified coma: Secondary | ICD-10-CM | POA: Diagnosis not present

## 2019-08-22 DIAGNOSIS — R110 Nausea: Secondary | ICD-10-CM | POA: Diagnosis not present

## 2019-08-22 DIAGNOSIS — Z23 Encounter for immunization: Secondary | ICD-10-CM | POA: Diagnosis not present

## 2019-08-22 DIAGNOSIS — S52121A Displaced fracture of head of right radius, initial encounter for closed fracture: Secondary | ICD-10-CM | POA: Diagnosis not present

## 2019-08-22 DIAGNOSIS — S0101XA Laceration without foreign body of scalp, initial encounter: Secondary | ICD-10-CM | POA: Diagnosis not present

## 2019-08-22 DIAGNOSIS — S71151A Open bite, right thigh, initial encounter: Secondary | ICD-10-CM | POA: Diagnosis not present

## 2019-08-22 DIAGNOSIS — M79651 Pain in right thigh: Secondary | ICD-10-CM | POA: Diagnosis not present

## 2019-08-22 DIAGNOSIS — S79921A Unspecified injury of right thigh, initial encounter: Secondary | ICD-10-CM | POA: Diagnosis not present

## 2019-08-22 DIAGNOSIS — R55 Syncope and collapse: Secondary | ICD-10-CM | POA: Diagnosis not present

## 2019-08-23 DIAGNOSIS — S52501A Unspecified fracture of the lower end of right radius, initial encounter for closed fracture: Secondary | ICD-10-CM

## 2019-08-23 DIAGNOSIS — T148XXA Other injury of unspecified body region, initial encounter: Secondary | ICD-10-CM | POA: Diagnosis not present

## 2019-08-23 DIAGNOSIS — E785 Hyperlipidemia, unspecified: Secondary | ICD-10-CM

## 2019-08-23 DIAGNOSIS — E039 Hypothyroidism, unspecified: Secondary | ICD-10-CM | POA: Diagnosis not present

## 2019-08-23 DIAGNOSIS — R55 Syncope and collapse: Secondary | ICD-10-CM | POA: Diagnosis not present

## 2019-08-23 DIAGNOSIS — R1314 Dysphagia, pharyngoesophageal phase: Secondary | ICD-10-CM

## 2019-08-23 DIAGNOSIS — W540XXA Bitten by dog, initial encounter: Secondary | ICD-10-CM | POA: Diagnosis not present

## 2019-08-23 DIAGNOSIS — I10 Essential (primary) hypertension: Secondary | ICD-10-CM

## 2019-08-23 DIAGNOSIS — S62009A Unspecified fracture of navicular [scaphoid] bone of unspecified wrist, initial encounter for closed fracture: Secondary | ICD-10-CM | POA: Diagnosis not present

## 2019-08-23 DIAGNOSIS — E119 Type 2 diabetes mellitus without complications: Secondary | ICD-10-CM

## 2019-08-23 DIAGNOSIS — M797 Fibromyalgia: Secondary | ICD-10-CM

## 2019-08-24 DIAGNOSIS — S0101XD Laceration without foreign body of scalp, subsequent encounter: Secondary | ICD-10-CM | POA: Diagnosis not present

## 2019-08-24 DIAGNOSIS — D62 Acute posthemorrhagic anemia: Secondary | ICD-10-CM | POA: Diagnosis not present

## 2019-08-24 DIAGNOSIS — S0101XA Laceration without foreign body of scalp, initial encounter: Secondary | ICD-10-CM | POA: Diagnosis not present

## 2019-08-24 DIAGNOSIS — W540XXA Bitten by dog, initial encounter: Secondary | ICD-10-CM | POA: Diagnosis not present

## 2019-08-24 DIAGNOSIS — E669 Obesity, unspecified: Secondary | ICD-10-CM | POA: Diagnosis not present

## 2019-08-24 DIAGNOSIS — R55 Syncope and collapse: Secondary | ICD-10-CM | POA: Diagnosis not present

## 2019-08-24 DIAGNOSIS — I739 Peripheral vascular disease, unspecified: Secondary | ICD-10-CM | POA: Diagnosis not present

## 2019-08-24 DIAGNOSIS — S81831A Puncture wound without foreign body, right lower leg, initial encounter: Secondary | ICD-10-CM | POA: Diagnosis not present

## 2019-08-24 DIAGNOSIS — S62001A Unspecified fracture of navicular [scaphoid] bone of right wrist, initial encounter for closed fracture: Secondary | ICD-10-CM | POA: Diagnosis not present

## 2019-08-24 DIAGNOSIS — Z79899 Other long term (current) drug therapy: Secondary | ICD-10-CM | POA: Diagnosis not present

## 2019-08-24 DIAGNOSIS — Z7982 Long term (current) use of aspirin: Secondary | ICD-10-CM | POA: Diagnosis not present

## 2019-08-24 DIAGNOSIS — E1169 Type 2 diabetes mellitus with other specified complication: Secondary | ICD-10-CM | POA: Diagnosis not present

## 2019-08-24 DIAGNOSIS — S62101A Fracture of unspecified carpal bone, right wrist, initial encounter for closed fracture: Secondary | ICD-10-CM | POA: Diagnosis not present

## 2019-08-24 DIAGNOSIS — Z6831 Body mass index (BMI) 31.0-31.9, adult: Secondary | ICD-10-CM | POA: Diagnosis not present

## 2019-08-24 DIAGNOSIS — I10 Essential (primary) hypertension: Secondary | ICD-10-CM | POA: Diagnosis not present

## 2019-08-24 DIAGNOSIS — E039 Hypothyroidism, unspecified: Secondary | ICD-10-CM | POA: Diagnosis not present

## 2019-08-24 DIAGNOSIS — E785 Hyperlipidemia, unspecified: Secondary | ICD-10-CM | POA: Diagnosis not present

## 2019-08-24 DIAGNOSIS — M810 Age-related osteoporosis without current pathological fracture: Secondary | ICD-10-CM | POA: Diagnosis not present

## 2019-08-24 DIAGNOSIS — R0789 Other chest pain: Secondary | ICD-10-CM | POA: Diagnosis not present

## 2019-08-24 DIAGNOSIS — S52501D Unspecified fracture of the lower end of right radius, subsequent encounter for closed fracture with routine healing: Secondary | ICD-10-CM | POA: Diagnosis not present

## 2019-08-24 DIAGNOSIS — T148XXD Other injury of unspecified body region, subsequent encounter: Secondary | ICD-10-CM | POA: Diagnosis not present

## 2019-08-24 DIAGNOSIS — K219 Gastro-esophageal reflux disease without esophagitis: Secondary | ICD-10-CM | POA: Diagnosis not present

## 2019-08-24 DIAGNOSIS — M25571 Pain in right ankle and joints of right foot: Secondary | ICD-10-CM | POA: Diagnosis not present

## 2019-08-24 DIAGNOSIS — S62009D Unspecified fracture of navicular [scaphoid] bone of unspecified wrist, subsequent encounter for fracture with routine healing: Secondary | ICD-10-CM | POA: Diagnosis not present

## 2019-08-24 DIAGNOSIS — Z23 Encounter for immunization: Secondary | ICD-10-CM | POA: Diagnosis not present

## 2019-08-24 DIAGNOSIS — S81831D Puncture wound without foreign body, right lower leg, subsequent encounter: Secondary | ICD-10-CM | POA: Diagnosis not present

## 2019-08-24 DIAGNOSIS — D649 Anemia, unspecified: Secondary | ICD-10-CM | POA: Diagnosis not present

## 2019-08-24 DIAGNOSIS — S52501A Unspecified fracture of the lower end of right radius, initial encounter for closed fracture: Secondary | ICD-10-CM | POA: Diagnosis not present

## 2019-08-24 DIAGNOSIS — T148XXA Other injury of unspecified body region, initial encounter: Secondary | ICD-10-CM | POA: Diagnosis not present

## 2019-08-24 DIAGNOSIS — E1140 Type 2 diabetes mellitus with diabetic neuropathy, unspecified: Secondary | ICD-10-CM | POA: Diagnosis not present

## 2019-08-24 DIAGNOSIS — S99911A Unspecified injury of right ankle, initial encounter: Secondary | ICD-10-CM | POA: Diagnosis not present

## 2019-08-24 DIAGNOSIS — M797 Fibromyalgia: Secondary | ICD-10-CM | POA: Diagnosis not present

## 2019-08-24 DIAGNOSIS — M1990 Unspecified osteoarthritis, unspecified site: Secondary | ICD-10-CM | POA: Diagnosis not present

## 2019-08-24 DIAGNOSIS — W19XXXD Unspecified fall, subsequent encounter: Secondary | ICD-10-CM | POA: Diagnosis not present

## 2019-08-24 DIAGNOSIS — S62009A Unspecified fracture of navicular [scaphoid] bone of unspecified wrist, initial encounter for closed fracture: Secondary | ICD-10-CM | POA: Diagnosis not present

## 2019-08-24 DIAGNOSIS — Z743 Need for continuous supervision: Secondary | ICD-10-CM | POA: Diagnosis not present

## 2019-08-24 DIAGNOSIS — R52 Pain, unspecified: Secondary | ICD-10-CM | POA: Diagnosis not present

## 2019-08-24 DIAGNOSIS — E119 Type 2 diabetes mellitus without complications: Secondary | ICD-10-CM | POA: Diagnosis not present

## 2019-08-24 DIAGNOSIS — R5381 Other malaise: Secondary | ICD-10-CM | POA: Diagnosis not present

## 2019-08-24 DIAGNOSIS — Z9181 History of falling: Secondary | ICD-10-CM | POA: Diagnosis not present

## 2019-08-24 DIAGNOSIS — Z7984 Long term (current) use of oral hypoglycemic drugs: Secondary | ICD-10-CM | POA: Diagnosis not present

## 2019-08-24 DIAGNOSIS — R1314 Dysphagia, pharyngoesophageal phase: Secondary | ICD-10-CM | POA: Diagnosis not present

## 2019-08-24 DIAGNOSIS — W540XXD Bitten by dog, subsequent encounter: Secondary | ICD-10-CM | POA: Diagnosis not present

## 2019-08-25 DIAGNOSIS — I10 Essential (primary) hypertension: Secondary | ICD-10-CM | POA: Diagnosis not present

## 2019-08-25 DIAGNOSIS — R1314 Dysphagia, pharyngoesophageal phase: Secondary | ICD-10-CM | POA: Diagnosis not present

## 2019-08-25 DIAGNOSIS — W540XXA Bitten by dog, initial encounter: Secondary | ICD-10-CM | POA: Diagnosis not present

## 2019-08-25 DIAGNOSIS — S99911A Unspecified injury of right ankle, initial encounter: Secondary | ICD-10-CM | POA: Diagnosis not present

## 2019-08-25 DIAGNOSIS — M25571 Pain in right ankle and joints of right foot: Secondary | ICD-10-CM | POA: Diagnosis not present

## 2019-08-25 DIAGNOSIS — R55 Syncope and collapse: Secondary | ICD-10-CM

## 2019-08-25 DIAGNOSIS — M797 Fibromyalgia: Secondary | ICD-10-CM | POA: Diagnosis not present

## 2019-08-25 DIAGNOSIS — S52501A Unspecified fracture of the lower end of right radius, initial encounter for closed fracture: Secondary | ICD-10-CM | POA: Diagnosis not present

## 2019-08-25 DIAGNOSIS — T148XXA Other injury of unspecified body region, initial encounter: Secondary | ICD-10-CM | POA: Diagnosis not present

## 2019-08-25 DIAGNOSIS — E785 Hyperlipidemia, unspecified: Secondary | ICD-10-CM | POA: Diagnosis not present

## 2019-08-25 DIAGNOSIS — E119 Type 2 diabetes mellitus without complications: Secondary | ICD-10-CM | POA: Diagnosis not present

## 2019-08-25 DIAGNOSIS — E039 Hypothyroidism, unspecified: Secondary | ICD-10-CM | POA: Diagnosis not present

## 2019-08-25 DIAGNOSIS — S62009A Unspecified fracture of navicular [scaphoid] bone of unspecified wrist, initial encounter for closed fracture: Secondary | ICD-10-CM | POA: Diagnosis not present

## 2019-08-26 DIAGNOSIS — R1314 Dysphagia, pharyngoesophageal phase: Secondary | ICD-10-CM | POA: Diagnosis not present

## 2019-08-26 DIAGNOSIS — E785 Hyperlipidemia, unspecified: Secondary | ICD-10-CM | POA: Diagnosis not present

## 2019-08-26 DIAGNOSIS — S81831D Puncture wound without foreign body, right lower leg, subsequent encounter: Secondary | ICD-10-CM | POA: Diagnosis not present

## 2019-08-26 DIAGNOSIS — Z23 Encounter for immunization: Secondary | ICD-10-CM | POA: Diagnosis not present

## 2019-08-26 DIAGNOSIS — E119 Type 2 diabetes mellitus without complications: Secondary | ICD-10-CM | POA: Diagnosis not present

## 2019-08-26 DIAGNOSIS — Z7982 Long term (current) use of aspirin: Secondary | ICD-10-CM | POA: Diagnosis not present

## 2019-08-26 DIAGNOSIS — S62011A Displaced fracture of distal pole of navicular [scaphoid] bone of right wrist, initial encounter for closed fracture: Secondary | ICD-10-CM | POA: Diagnosis not present

## 2019-08-26 DIAGNOSIS — R42 Dizziness and giddiness: Secondary | ICD-10-CM | POA: Diagnosis not present

## 2019-08-26 DIAGNOSIS — S52501A Unspecified fracture of the lower end of right radius, initial encounter for closed fracture: Secondary | ICD-10-CM | POA: Diagnosis not present

## 2019-08-26 DIAGNOSIS — R52 Pain, unspecified: Secondary | ICD-10-CM | POA: Diagnosis not present

## 2019-08-26 DIAGNOSIS — Z6831 Body mass index (BMI) 31.0-31.9, adult: Secondary | ICD-10-CM | POA: Diagnosis not present

## 2019-08-26 DIAGNOSIS — M810 Age-related osteoporosis without current pathological fracture: Secondary | ICD-10-CM | POA: Diagnosis not present

## 2019-08-26 DIAGNOSIS — Z79899 Other long term (current) drug therapy: Secondary | ICD-10-CM | POA: Diagnosis not present

## 2019-08-26 DIAGNOSIS — Z7984 Long term (current) use of oral hypoglycemic drugs: Secondary | ICD-10-CM | POA: Diagnosis not present

## 2019-08-26 DIAGNOSIS — S0101XD Laceration without foreign body of scalp, subsequent encounter: Secondary | ICD-10-CM | POA: Diagnosis not present

## 2019-08-26 DIAGNOSIS — I739 Peripheral vascular disease, unspecified: Secondary | ICD-10-CM | POA: Diagnosis not present

## 2019-08-26 DIAGNOSIS — Z743 Need for continuous supervision: Secondary | ICD-10-CM | POA: Diagnosis not present

## 2019-08-26 DIAGNOSIS — R55 Syncope and collapse: Secondary | ICD-10-CM | POA: Diagnosis not present

## 2019-08-26 DIAGNOSIS — E669 Obesity, unspecified: Secondary | ICD-10-CM | POA: Diagnosis not present

## 2019-08-26 DIAGNOSIS — S0101XA Laceration without foreign body of scalp, initial encounter: Secondary | ICD-10-CM | POA: Diagnosis not present

## 2019-08-26 DIAGNOSIS — K219 Gastro-esophageal reflux disease without esophagitis: Secondary | ICD-10-CM | POA: Diagnosis not present

## 2019-08-26 DIAGNOSIS — S0181XA Laceration without foreign body of other part of head, initial encounter: Secondary | ICD-10-CM | POA: Diagnosis not present

## 2019-08-26 DIAGNOSIS — D62 Acute posthemorrhagic anemia: Secondary | ICD-10-CM | POA: Diagnosis not present

## 2019-08-26 DIAGNOSIS — Z20828 Contact with and (suspected) exposure to other viral communicable diseases: Secondary | ICD-10-CM | POA: Diagnosis not present

## 2019-08-26 DIAGNOSIS — R0902 Hypoxemia: Secondary | ICD-10-CM | POA: Diagnosis not present

## 2019-08-26 DIAGNOSIS — R0789 Other chest pain: Secondary | ICD-10-CM | POA: Diagnosis not present

## 2019-08-26 DIAGNOSIS — S62009A Unspecified fracture of navicular [scaphoid] bone of unspecified wrist, initial encounter for closed fracture: Secondary | ICD-10-CM | POA: Diagnosis not present

## 2019-08-26 DIAGNOSIS — S81831A Puncture wound without foreign body, right lower leg, initial encounter: Secondary | ICD-10-CM | POA: Diagnosis not present

## 2019-08-26 DIAGNOSIS — E1140 Type 2 diabetes mellitus with diabetic neuropathy, unspecified: Secondary | ICD-10-CM | POA: Diagnosis not present

## 2019-08-26 DIAGNOSIS — M1990 Unspecified osteoarthritis, unspecified site: Secondary | ICD-10-CM | POA: Diagnosis not present

## 2019-08-26 DIAGNOSIS — Z9181 History of falling: Secondary | ICD-10-CM | POA: Diagnosis not present

## 2019-08-26 DIAGNOSIS — S199XXA Unspecified injury of neck, initial encounter: Secondary | ICD-10-CM | POA: Diagnosis not present

## 2019-08-26 DIAGNOSIS — S62101A Fracture of unspecified carpal bone, right wrist, initial encounter for closed fracture: Secondary | ICD-10-CM | POA: Diagnosis not present

## 2019-08-26 DIAGNOSIS — R5381 Other malaise: Secondary | ICD-10-CM | POA: Diagnosis not present

## 2019-08-26 DIAGNOSIS — W540XXA Bitten by dog, initial encounter: Secondary | ICD-10-CM | POA: Diagnosis not present

## 2019-08-26 DIAGNOSIS — E1151 Type 2 diabetes mellitus with diabetic peripheral angiopathy without gangrene: Secondary | ICD-10-CM | POA: Diagnosis not present

## 2019-08-26 DIAGNOSIS — S52501D Unspecified fracture of the lower end of right radius, subsequent encounter for closed fracture with routine healing: Secondary | ICD-10-CM | POA: Diagnosis not present

## 2019-08-26 DIAGNOSIS — R262 Difficulty in walking, not elsewhere classified: Secondary | ICD-10-CM | POA: Diagnosis not present

## 2019-08-26 DIAGNOSIS — T148XXD Other injury of unspecified body region, subsequent encounter: Secondary | ICD-10-CM | POA: Diagnosis not present

## 2019-08-26 DIAGNOSIS — I10 Essential (primary) hypertension: Secondary | ICD-10-CM | POA: Diagnosis not present

## 2019-08-26 DIAGNOSIS — Z79891 Long term (current) use of opiate analgesic: Secondary | ICD-10-CM | POA: Diagnosis not present

## 2019-08-26 DIAGNOSIS — T148XXA Other injury of unspecified body region, initial encounter: Secondary | ICD-10-CM | POA: Diagnosis not present

## 2019-08-26 DIAGNOSIS — W540XXD Bitten by dog, subsequent encounter: Secondary | ICD-10-CM | POA: Diagnosis not present

## 2019-08-26 DIAGNOSIS — M797 Fibromyalgia: Secondary | ICD-10-CM | POA: Diagnosis not present

## 2019-08-26 DIAGNOSIS — E039 Hypothyroidism, unspecified: Secondary | ICD-10-CM | POA: Diagnosis not present

## 2019-08-26 DIAGNOSIS — S62001A Unspecified fracture of navicular [scaphoid] bone of right wrist, initial encounter for closed fracture: Secondary | ICD-10-CM | POA: Diagnosis not present

## 2019-08-26 DIAGNOSIS — S62009D Unspecified fracture of navicular [scaphoid] bone of unspecified wrist, subsequent encounter for fracture with routine healing: Secondary | ICD-10-CM | POA: Diagnosis not present

## 2019-08-26 DIAGNOSIS — D649 Anemia, unspecified: Secondary | ICD-10-CM | POA: Diagnosis not present

## 2019-08-26 DIAGNOSIS — R609 Edema, unspecified: Secondary | ICD-10-CM | POA: Diagnosis not present

## 2019-08-26 DIAGNOSIS — S52301D Unspecified fracture of shaft of right radius, subsequent encounter for closed fracture with routine healing: Secondary | ICD-10-CM | POA: Diagnosis not present

## 2019-08-26 DIAGNOSIS — W19XXXD Unspecified fall, subsequent encounter: Secondary | ICD-10-CM | POA: Diagnosis not present

## 2019-08-26 DIAGNOSIS — M25571 Pain in right ankle and joints of right foot: Secondary | ICD-10-CM | POA: Diagnosis not present

## 2019-08-26 DIAGNOSIS — E1169 Type 2 diabetes mellitus with other specified complication: Secondary | ICD-10-CM | POA: Diagnosis not present

## 2019-08-26 DIAGNOSIS — S3993XA Unspecified injury of pelvis, initial encounter: Secondary | ICD-10-CM | POA: Diagnosis not present

## 2019-08-27 DIAGNOSIS — R262 Difficulty in walking, not elsewhere classified: Secondary | ICD-10-CM | POA: Diagnosis not present

## 2019-08-27 DIAGNOSIS — E1151 Type 2 diabetes mellitus with diabetic peripheral angiopathy without gangrene: Secondary | ICD-10-CM | POA: Diagnosis not present

## 2019-08-27 DIAGNOSIS — D649 Anemia, unspecified: Secondary | ICD-10-CM | POA: Diagnosis not present

## 2019-08-27 DIAGNOSIS — S52301D Unspecified fracture of shaft of right radius, subsequent encounter for closed fracture with routine healing: Secondary | ICD-10-CM | POA: Diagnosis not present

## 2019-08-29 DIAGNOSIS — S62011A Displaced fracture of distal pole of navicular [scaphoid] bone of right wrist, initial encounter for closed fracture: Secondary | ICD-10-CM | POA: Diagnosis not present

## 2019-08-30 DIAGNOSIS — S62011A Displaced fracture of distal pole of navicular [scaphoid] bone of right wrist, initial encounter for closed fracture: Secondary | ICD-10-CM | POA: Diagnosis not present

## 2019-09-13 DIAGNOSIS — R42 Dizziness and giddiness: Secondary | ICD-10-CM | POA: Diagnosis not present

## 2019-09-13 DIAGNOSIS — S199XXA Unspecified injury of neck, initial encounter: Secondary | ICD-10-CM | POA: Diagnosis not present

## 2019-09-13 DIAGNOSIS — S0181XA Laceration without foreign body of other part of head, initial encounter: Secondary | ICD-10-CM | POA: Diagnosis not present

## 2019-09-13 DIAGNOSIS — E039 Hypothyroidism, unspecified: Secondary | ICD-10-CM | POA: Diagnosis not present

## 2019-09-13 DIAGNOSIS — Z7982 Long term (current) use of aspirin: Secondary | ICD-10-CM | POA: Diagnosis not present

## 2019-09-13 DIAGNOSIS — Z79899 Other long term (current) drug therapy: Secondary | ICD-10-CM | POA: Diagnosis not present

## 2019-09-13 DIAGNOSIS — Z79891 Long term (current) use of opiate analgesic: Secondary | ICD-10-CM | POA: Diagnosis not present

## 2019-09-13 DIAGNOSIS — I10 Essential (primary) hypertension: Secondary | ICD-10-CM | POA: Diagnosis not present

## 2019-09-13 DIAGNOSIS — S3993XA Unspecified injury of pelvis, initial encounter: Secondary | ICD-10-CM | POA: Diagnosis not present

## 2019-09-13 DIAGNOSIS — Z7984 Long term (current) use of oral hypoglycemic drugs: Secondary | ICD-10-CM | POA: Diagnosis not present

## 2019-09-13 DIAGNOSIS — E119 Type 2 diabetes mellitus without complications: Secondary | ICD-10-CM | POA: Diagnosis not present

## 2019-09-13 DIAGNOSIS — R55 Syncope and collapse: Secondary | ICD-10-CM | POA: Diagnosis not present

## 2019-09-15 DIAGNOSIS — S62011A Displaced fracture of distal pole of navicular [scaphoid] bone of right wrist, initial encounter for closed fracture: Secondary | ICD-10-CM | POA: Diagnosis not present

## 2019-09-21 DIAGNOSIS — Z7984 Long term (current) use of oral hypoglycemic drugs: Secondary | ICD-10-CM | POA: Diagnosis not present

## 2019-09-21 DIAGNOSIS — Z79899 Other long term (current) drug therapy: Secondary | ICD-10-CM | POA: Diagnosis not present

## 2019-09-21 DIAGNOSIS — M797 Fibromyalgia: Secondary | ICD-10-CM | POA: Diagnosis not present

## 2019-09-21 DIAGNOSIS — E669 Obesity, unspecified: Secondary | ICD-10-CM | POA: Diagnosis not present

## 2019-09-21 DIAGNOSIS — R2689 Other abnormalities of gait and mobility: Secondary | ICD-10-CM | POA: Diagnosis not present

## 2019-09-21 DIAGNOSIS — E1140 Type 2 diabetes mellitus with diabetic neuropathy, unspecified: Secondary | ICD-10-CM | POA: Diagnosis not present

## 2019-09-21 DIAGNOSIS — S52501D Unspecified fracture of the lower end of right radius, subsequent encounter for closed fracture with routine healing: Secondary | ICD-10-CM | POA: Diagnosis not present

## 2019-09-21 DIAGNOSIS — Z9181 History of falling: Secondary | ICD-10-CM | POA: Diagnosis not present

## 2019-09-21 DIAGNOSIS — Z7982 Long term (current) use of aspirin: Secondary | ICD-10-CM | POA: Diagnosis not present

## 2019-09-21 DIAGNOSIS — W19XXXD Unspecified fall, subsequent encounter: Secondary | ICD-10-CM | POA: Diagnosis not present

## 2019-09-21 DIAGNOSIS — Z792 Long term (current) use of antibiotics: Secondary | ICD-10-CM | POA: Diagnosis not present

## 2019-09-21 DIAGNOSIS — I739 Peripheral vascular disease, unspecified: Secondary | ICD-10-CM | POA: Diagnosis not present

## 2019-09-21 DIAGNOSIS — S62001D Unspecified fracture of navicular [scaphoid] bone of right wrist, subsequent encounter for fracture with routine healing: Secondary | ICD-10-CM | POA: Diagnosis not present

## 2019-09-21 DIAGNOSIS — K219 Gastro-esophageal reflux disease without esophagitis: Secondary | ICD-10-CM | POA: Diagnosis not present

## 2019-09-21 DIAGNOSIS — W540XXD Bitten by dog, subsequent encounter: Secondary | ICD-10-CM | POA: Diagnosis not present

## 2019-09-21 DIAGNOSIS — M810 Age-related osteoporosis without current pathological fracture: Secondary | ICD-10-CM | POA: Diagnosis not present

## 2019-09-21 DIAGNOSIS — S81012D Laceration without foreign body, left knee, subsequent encounter: Secondary | ICD-10-CM | POA: Diagnosis not present

## 2019-09-21 DIAGNOSIS — E039 Hypothyroidism, unspecified: Secondary | ICD-10-CM | POA: Diagnosis not present

## 2019-09-21 DIAGNOSIS — Z79891 Long term (current) use of opiate analgesic: Secondary | ICD-10-CM | POA: Diagnosis not present

## 2019-09-21 DIAGNOSIS — D649 Anemia, unspecified: Secondary | ICD-10-CM | POA: Diagnosis not present

## 2019-09-21 DIAGNOSIS — E785 Hyperlipidemia, unspecified: Secondary | ICD-10-CM | POA: Diagnosis not present

## 2019-09-21 DIAGNOSIS — R5383 Other fatigue: Secondary | ICD-10-CM | POA: Diagnosis not present

## 2019-09-21 DIAGNOSIS — I10 Essential (primary) hypertension: Secondary | ICD-10-CM | POA: Diagnosis not present

## 2019-09-21 DIAGNOSIS — R1314 Dysphagia, pharyngoesophageal phase: Secondary | ICD-10-CM | POA: Diagnosis not present

## 2019-09-26 DIAGNOSIS — S52514A Nondisplaced fracture of right radial styloid process, initial encounter for closed fracture: Secondary | ICD-10-CM | POA: Diagnosis not present

## 2019-09-26 DIAGNOSIS — S62001A Unspecified fracture of navicular [scaphoid] bone of right wrist, initial encounter for closed fracture: Secondary | ICD-10-CM | POA: Diagnosis not present

## 2019-09-26 DIAGNOSIS — R221 Localized swelling, mass and lump, neck: Secondary | ICD-10-CM | POA: Diagnosis not present

## 2019-10-05 DIAGNOSIS — M79605 Pain in left leg: Secondary | ICD-10-CM | POA: Diagnosis not present

## 2019-10-05 DIAGNOSIS — M79661 Pain in right lower leg: Secondary | ICD-10-CM | POA: Diagnosis not present

## 2019-10-05 DIAGNOSIS — R600 Localized edema: Secondary | ICD-10-CM | POA: Diagnosis not present

## 2019-10-05 DIAGNOSIS — I10 Essential (primary) hypertension: Secondary | ICD-10-CM | POA: Diagnosis not present

## 2019-10-05 DIAGNOSIS — M79662 Pain in left lower leg: Secondary | ICD-10-CM | POA: Diagnosis not present

## 2019-10-05 DIAGNOSIS — M79604 Pain in right leg: Secondary | ICD-10-CM | POA: Diagnosis not present

## 2019-10-05 DIAGNOSIS — M7989 Other specified soft tissue disorders: Secondary | ICD-10-CM | POA: Diagnosis not present

## 2019-10-11 DIAGNOSIS — S62011A Displaced fracture of distal pole of navicular [scaphoid] bone of right wrist, initial encounter for closed fracture: Secondary | ICD-10-CM | POA: Diagnosis not present

## 2019-10-20 DIAGNOSIS — M797 Fibromyalgia: Secondary | ICD-10-CM | POA: Diagnosis not present

## 2019-10-20 DIAGNOSIS — E1142 Type 2 diabetes mellitus with diabetic polyneuropathy: Secondary | ICD-10-CM | POA: Diagnosis not present

## 2019-10-20 DIAGNOSIS — E782 Mixed hyperlipidemia: Secondary | ICD-10-CM | POA: Diagnosis not present

## 2019-10-20 DIAGNOSIS — Z6829 Body mass index (BMI) 29.0-29.9, adult: Secondary | ICD-10-CM | POA: Diagnosis not present

## 2019-10-20 DIAGNOSIS — I10 Essential (primary) hypertension: Secondary | ICD-10-CM | POA: Diagnosis not present

## 2019-10-20 DIAGNOSIS — E038 Other specified hypothyroidism: Secondary | ICD-10-CM | POA: Diagnosis not present

## 2019-10-20 DIAGNOSIS — E668 Other obesity: Secondary | ICD-10-CM | POA: Diagnosis not present

## 2019-10-20 DIAGNOSIS — E1169 Type 2 diabetes mellitus with other specified complication: Secondary | ICD-10-CM | POA: Diagnosis not present

## 2019-10-21 DIAGNOSIS — M810 Age-related osteoporosis without current pathological fracture: Secondary | ICD-10-CM | POA: Diagnosis not present

## 2019-10-21 DIAGNOSIS — Z79899 Other long term (current) drug therapy: Secondary | ICD-10-CM | POA: Diagnosis not present

## 2019-10-21 DIAGNOSIS — K219 Gastro-esophageal reflux disease without esophagitis: Secondary | ICD-10-CM | POA: Diagnosis not present

## 2019-10-21 DIAGNOSIS — I739 Peripheral vascular disease, unspecified: Secondary | ICD-10-CM | POA: Diagnosis not present

## 2019-10-21 DIAGNOSIS — Z7984 Long term (current) use of oral hypoglycemic drugs: Secondary | ICD-10-CM | POA: Diagnosis not present

## 2019-10-21 DIAGNOSIS — E039 Hypothyroidism, unspecified: Secondary | ICD-10-CM | POA: Diagnosis not present

## 2019-10-21 DIAGNOSIS — R2689 Other abnormalities of gait and mobility: Secondary | ICD-10-CM | POA: Diagnosis not present

## 2019-10-21 DIAGNOSIS — R5383 Other fatigue: Secondary | ICD-10-CM | POA: Diagnosis not present

## 2019-10-21 DIAGNOSIS — I10 Essential (primary) hypertension: Secondary | ICD-10-CM | POA: Diagnosis not present

## 2019-10-21 DIAGNOSIS — S62001D Unspecified fracture of navicular [scaphoid] bone of right wrist, subsequent encounter for fracture with routine healing: Secondary | ICD-10-CM | POA: Diagnosis not present

## 2019-10-21 DIAGNOSIS — Z79891 Long term (current) use of opiate analgesic: Secondary | ICD-10-CM | POA: Diagnosis not present

## 2019-10-21 DIAGNOSIS — W540XXD Bitten by dog, subsequent encounter: Secondary | ICD-10-CM | POA: Diagnosis not present

## 2019-10-21 DIAGNOSIS — D649 Anemia, unspecified: Secondary | ICD-10-CM | POA: Diagnosis not present

## 2019-10-21 DIAGNOSIS — E669 Obesity, unspecified: Secondary | ICD-10-CM | POA: Diagnosis not present

## 2019-10-21 DIAGNOSIS — R1314 Dysphagia, pharyngoesophageal phase: Secondary | ICD-10-CM | POA: Diagnosis not present

## 2019-10-21 DIAGNOSIS — Z9181 History of falling: Secondary | ICD-10-CM | POA: Diagnosis not present

## 2019-10-21 DIAGNOSIS — E785 Hyperlipidemia, unspecified: Secondary | ICD-10-CM | POA: Diagnosis not present

## 2019-10-21 DIAGNOSIS — S52501D Unspecified fracture of the lower end of right radius, subsequent encounter for closed fracture with routine healing: Secondary | ICD-10-CM | POA: Diagnosis not present

## 2019-10-21 DIAGNOSIS — S81012D Laceration without foreign body, left knee, subsequent encounter: Secondary | ICD-10-CM | POA: Diagnosis not present

## 2019-10-21 DIAGNOSIS — Z7982 Long term (current) use of aspirin: Secondary | ICD-10-CM | POA: Diagnosis not present

## 2019-10-21 DIAGNOSIS — M797 Fibromyalgia: Secondary | ICD-10-CM | POA: Diagnosis not present

## 2019-10-21 DIAGNOSIS — E1140 Type 2 diabetes mellitus with diabetic neuropathy, unspecified: Secondary | ICD-10-CM | POA: Diagnosis not present

## 2019-10-21 DIAGNOSIS — Z792 Long term (current) use of antibiotics: Secondary | ICD-10-CM | POA: Diagnosis not present

## 2019-10-21 DIAGNOSIS — W19XXXD Unspecified fall, subsequent encounter: Secondary | ICD-10-CM | POA: Diagnosis not present

## 2019-11-08 DIAGNOSIS — Z0000 Encounter for general adult medical examination without abnormal findings: Secondary | ICD-10-CM | POA: Diagnosis not present

## 2019-11-08 DIAGNOSIS — Z6831 Body mass index (BMI) 31.0-31.9, adult: Secondary | ICD-10-CM | POA: Diagnosis not present

## 2019-11-10 DIAGNOSIS — S62011A Displaced fracture of distal pole of navicular [scaphoid] bone of right wrist, initial encounter for closed fracture: Secondary | ICD-10-CM | POA: Diagnosis not present

## 2019-11-14 DIAGNOSIS — S62011A Displaced fracture of distal pole of navicular [scaphoid] bone of right wrist, initial encounter for closed fracture: Secondary | ICD-10-CM | POA: Diagnosis not present

## 2019-11-16 DIAGNOSIS — R221 Localized swelling, mass and lump, neck: Secondary | ICD-10-CM | POA: Diagnosis not present

## 2019-12-07 DIAGNOSIS — M25561 Pain in right knee: Secondary | ICD-10-CM | POA: Diagnosis not present

## 2019-12-07 DIAGNOSIS — M533 Sacrococcygeal disorders, not elsewhere classified: Secondary | ICD-10-CM | POA: Diagnosis not present

## 2019-12-09 DIAGNOSIS — S62011A Displaced fracture of distal pole of navicular [scaphoid] bone of right wrist, initial encounter for closed fracture: Secondary | ICD-10-CM | POA: Diagnosis not present

## 2019-12-16 DIAGNOSIS — S62011A Displaced fracture of distal pole of navicular [scaphoid] bone of right wrist, initial encounter for closed fracture: Secondary | ICD-10-CM | POA: Diagnosis not present

## 2019-12-16 DIAGNOSIS — S62001D Unspecified fracture of navicular [scaphoid] bone of right wrist, subsequent encounter for fracture with routine healing: Secondary | ICD-10-CM | POA: Diagnosis not present

## 2019-12-20 DIAGNOSIS — S62011A Displaced fracture of distal pole of navicular [scaphoid] bone of right wrist, initial encounter for closed fracture: Secondary | ICD-10-CM | POA: Diagnosis not present

## 2020-01-02 DIAGNOSIS — M1711 Unilateral primary osteoarthritis, right knee: Secondary | ICD-10-CM | POA: Diagnosis not present

## 2020-01-04 ENCOUNTER — Other Ambulatory Visit: Payer: Self-pay

## 2020-01-04 DIAGNOSIS — T148XXA Other injury of unspecified body region, initial encounter: Secondary | ICD-10-CM

## 2020-01-04 DIAGNOSIS — Z780 Asymptomatic menopausal state: Secondary | ICD-10-CM

## 2020-01-05 ENCOUNTER — Telehealth: Payer: Self-pay

## 2020-01-05 DIAGNOSIS — R42 Dizziness and giddiness: Secondary | ICD-10-CM | POA: Diagnosis not present

## 2020-01-05 DIAGNOSIS — B9689 Other specified bacterial agents as the cause of diseases classified elsewhere: Secondary | ICD-10-CM | POA: Diagnosis not present

## 2020-01-05 DIAGNOSIS — N3000 Acute cystitis without hematuria: Secondary | ICD-10-CM | POA: Diagnosis not present

## 2020-01-05 DIAGNOSIS — S0990XA Unspecified injury of head, initial encounter: Secondary | ICD-10-CM | POA: Diagnosis not present

## 2020-01-05 NOTE — Progress Notes (Signed)
[facility] has been scheduled for 01/18/2020 at 1:00pm [facility].  Cindy Gordon has been called and notified of appointment date and time.

## 2020-01-05 NOTE — Telephone Encounter (Signed)
Cindy Gordon called to report that she has been dizzy for the last 3 days.  She had a fall 2 days ago but she did not strike her head.  She did however scrape her arm.  She had a friend check her bp yesterday and it was 198/88.  She continues to be dizzy.  She denied weakness or problems with her speech.  Dr. Tobie Poet advised that she go immediately to the ED for evaluation and treatment.

## 2020-01-09 ENCOUNTER — Other Ambulatory Visit: Payer: Self-pay

## 2020-01-09 ENCOUNTER — Ambulatory Visit (INDEPENDENT_AMBULATORY_CARE_PROVIDER_SITE_OTHER): Payer: PPO | Admitting: Family Medicine

## 2020-01-09 ENCOUNTER — Encounter: Payer: Self-pay | Admitting: Family Medicine

## 2020-01-09 VITALS — BP 140/70 | HR 80 | Temp 97.0°F | Ht 63.0 in | Wt 175.0 lb

## 2020-01-09 DIAGNOSIS — R42 Dizziness and giddiness: Secondary | ICD-10-CM | POA: Diagnosis not present

## 2020-01-09 DIAGNOSIS — N3001 Acute cystitis with hematuria: Secondary | ICD-10-CM

## 2020-01-09 DIAGNOSIS — W1830XA Fall on same level, unspecified, initial encounter: Secondary | ICD-10-CM

## 2020-01-09 DIAGNOSIS — I10 Essential (primary) hypertension: Secondary | ICD-10-CM | POA: Diagnosis not present

## 2020-01-09 DIAGNOSIS — S40811A Abrasion of right upper arm, initial encounter: Secondary | ICD-10-CM

## 2020-01-09 NOTE — Patient Instructions (Addendum)
Increase losartan/hct to 50/12.5 mg one twice a day. Continue meclizine 25 mg one three times a day as needed for dizziness. Wash skin laceration once daily with antibacterial soap and put antibiotic ointment and nonstick dressing.  Refer to physical therapy for vertigo.  Benign Positional Vertigo Vertigo is the feeling that you or your surroundings are moving when they are not. Benign positional vertigo is the most common form of vertigo. This is usually a harmless condition (benign). This condition is positional. This means that symptoms are triggered by certain movements and positions. This condition can be dangerous if it occurs while you are doing something that could cause harm to you or others. This includes activities such as driving or operating machinery. What are the causes? In many cases, the cause of this condition is not known. It may be caused by a disturbance in an area of the inner ear that helps your brain to sense movement and balance. This disturbance can be caused by:  Viral infection (labyrinthitis).  Head injury.  Repetitive motion, such as jumping, dancing, or running. What increases the risk? You are more likely to develop this condition if:  You are a woman.  You are 25 years of age or older. What are the signs or symptoms? Symptoms of this condition usually happen when you move your head or your eyes in different directions. Symptoms may start suddenly, and usually last for less than a minute. They include:  Loss of balance and falling.  Feeling like you are spinning or moving.  Feeling like your surroundings are spinning or moving.  Nausea and vomiting.  Blurred vision.  Dizziness.  Involuntary eye movement (nystagmus). Symptoms can be mild and cause only minor problems, or they can be severe and interfere with daily life. Episodes of benign positional vertigo may return (recur) over time. Symptoms may improve over time. How is this diagnosed? This  condition may be diagnosed based on:  Your medical history.  Physical exam of the head, neck, and ears.  Tests, such as: ? MRI. ? CT scan. ? Eye movement tests. Your health care provider may ask you to change positions quickly while he or she watches you for symptoms of benign positional vertigo, such as nystagmus. Eye movement may be tested with a variety of exams that are designed to evaluate or stimulate vertigo. ? An electroencephalogram (EEG). This records electrical activity in your brain. ? Hearing tests. You may be referred to a health care provider who specializes in ear, nose, and throat (ENT) problems (otolaryngologist) or a provider who specializes in disorders of the nervous system (neurologist). How is this treated?  This condition may be treated in a session in which your health care provider moves your head in specific positions to adjust your inner ear back to normal. Treatment for this condition may take several sessions. Surgery may be needed in severe cases, but this is rare. In some cases, benign positional vertigo may resolve on its own in 2-4 weeks. Follow these instructions at home: Safety  Move slowly. Avoid sudden body or head movements or certain positions, as told by your health care provider.  Avoid driving until your health care provider says it is safe for you to do so.  Avoid operating heavy machinery until your health care provider says it is safe for you to do so.  Avoid doing any tasks that would be dangerous to you or others if vertigo occurs.  If you have trouble walking or keeping your balance,  try using a cane for stability. If you feel dizzy or unstable, sit down right away.  Return to your normal activities as told by your health care provider. Ask your health care provider what activities are safe for you. General instructions  Take over-the-counter and prescription medicines only as told by your health care provider.  Drink enough fluid  to keep your urine pale yellow.  Keep all follow-up visits as told by your health care provider. This is important. Contact a health care provider if:  You have a fever.  Your condition gets worse or you develop new symptoms.  Your family or friends notice any behavioral changes.  You have nausea or vomiting that gets worse.  You have numbness or a "pins and needles" sensation. Get help right away if you:  Have difficulty speaking or moving.  Are always dizzy.  Faint.  Develop severe headaches.  Have weakness in your legs or arms.  Have changes in your hearing or vision.  Develop a stiff neck.  Develop sensitivity to light. Summary  Vertigo is the feeling that you or your surroundings are moving when they are not. Benign positional vertigo is the most common form of vertigo.  The cause of this condition is not known. It may be caused by a disturbance in an area of the inner ear that helps your brain to sense movement and balance.  Symptoms include loss of balance and falling, feeling that you or your surroundings are moving, nausea and vomiting, and blurred vision.  This condition can be diagnosed based on symptoms, physical exam, and other tests, such as MRI, CT scan, eye movement tests, and hearing tests.  Follow safety instructions as told by your health care provider. You will also be told when to contact your health care provider in case of problems. This information is not intended to replace advice given to you by your health care provider. Make sure you discuss any questions you have with your health care provider. Document Revised: 04/07/2018 Document Reviewed: 04/07/2018 Elsevier Patient Education  [place].

## 2020-01-18 DIAGNOSIS — S62011D Displaced fracture of distal pole of navicular [scaphoid] bone of right wrist, subsequent encounter for fracture with routine healing: Secondary | ICD-10-CM | POA: Diagnosis not present

## 2020-01-18 DIAGNOSIS — Z780 Asymptomatic menopausal state: Secondary | ICD-10-CM | POA: Diagnosis not present

## 2020-01-18 DIAGNOSIS — M85851 Other specified disorders of bone density and structure, right thigh: Secondary | ICD-10-CM | POA: Diagnosis not present

## 2020-01-23 ENCOUNTER — Encounter: Payer: Self-pay | Admitting: Family Medicine

## 2020-01-23 DIAGNOSIS — R42 Dizziness and giddiness: Secondary | ICD-10-CM | POA: Insufficient documentation

## 2020-01-23 DIAGNOSIS — W1830XA Fall on same level, unspecified, initial encounter: Secondary | ICD-10-CM | POA: Insufficient documentation

## 2020-01-23 DIAGNOSIS — I10 Essential (primary) hypertension: Secondary | ICD-10-CM | POA: Insufficient documentation

## 2020-01-23 DIAGNOSIS — N3000 Acute cystitis without hematuria: Secondary | ICD-10-CM | POA: Insufficient documentation

## 2020-01-23 DIAGNOSIS — S40811A Abrasion of right upper arm, initial encounter: Secondary | ICD-10-CM | POA: Insufficient documentation

## 2020-01-23 NOTE — Assessment & Plan Note (Signed)
Caution. Use walker/cane if needed.

## 2020-01-23 NOTE — Assessment & Plan Note (Signed)
Complete cipro

## 2020-01-23 NOTE — Progress Notes (Signed)
Established Patient Office Visit  Subjective:  Patient ID: Cindy Gordon, female    DOB: Nov 27, 1940  Age: 79 y.o. MRN: [id number]  CC:  Chief Complaint  Patient presents with  . Urinary Tract Infection    HPI Cindy Gordon presents for bladder symptoms. Polyuria. Four times an hour yesterday during the day. Dysuria. No flank pain. Complaining of lower abdominal pain. Denies nausea and vomiting.  Vertigo has nearly resolved. Takes meclizine with her everywhere. BP has improved without increase in losartan/hctz. Taking once daily because rx sent in incorrectly.   Past Medical History:  Diagnosis Date  . Atrophy of thyroid (acquired)   . Carpal tunnel syndrome   . Chronic back pain   . Diabetes mellitus without complication ([place])   . Fibromyalgia   . Gastro-esophageal reflux disease without esophagitis   . Hyperlipidemia   . Hypertension   . Irritable bowel syndrome   . Localized edema   . Localized swelling, mass and lump, neck   . Neuromuscular dysfunction of bladder, unspecified   . Obstructive sleep apnea (adult) (pediatric)   . Other obesity   . Other specified hypothyroidism   . Sciatica, right side   . Urge incontinence   . Vitamin B 12 deficiency   . Vitamin D deficiency     Past Surgical History:  Procedure Laterality Date  . APPENDECTOMY    . BLADDER SUSPENSION    . BREAST BIOPSY     right breast: benign  . carpel tunnel repair Left 08/2014  . CATARACT EXTRACTION  2019   right   . CHOLECYSTECTOMY OPEN  1980   acute panceatitis:requiring removal of stones from pancreatitic duct  . dobutamine nuclear stress test     12/2010  . HYSTERECTOMY ABDOMINAL WITH SALPINGO-OOPHORECTOMY    . LUMBAR LAMINECTOMY    . RECTOCELE REPAIR  02/2011  . REVERSE TOTAL SHOULDER ARTHROPLASTY  07/2017  . TOTAL SHOULDER REPLACEMENT  2002   secondary to staph infection  . VEIN LIGATION AND STRIPPING      Family History  Problem Relation Age of Onset  . Alzheimer's  disease Mother   . CVA Mother   . Alzheimer's disease Sister   . Diabetes Sister   . Hypertension Sister   . Hypothyroidism Sister   . Congenital heart disease Sister   . Prostate cancer Son   . Coronary artery disease Daughter   . Peptic Ulcer Other   . Diabetes type II Other   . Cystic fibrosis Grandson     Social History   Socioeconomic History  . Marital status: Widowed    Spouse name: Not on file  . Number of children: 3  . Years of education: Not on file  . Highest education level: Not on file  Occupational History  . Not on file  Tobacco Use  . Smoking status: Never Smoker  . Smokeless tobacco: Never Used  Substance and Sexual Activity  . Alcohol use: Never  . Drug use: Never  . Sexual activity: Not on file  Other Topics Concern  . Not on file  Social History Narrative  . Not on file   Social Determinants of Health   Financial Resource Strain:   . Difficulty of Paying Living Expenses:   Food Insecurity:   . Worried About Charity fundraiser in the Last Year:   . Arboriculturist in the Last Year:   Transportation Needs:   . Film/video editor (Medical):   Marland Kitchen Lack of Transportation (  Non-Medical):   Physical Activity:   . Days of Exercise per Week:   . Minutes of Exercise per Session:   Stress:   . Feeling of Stress :   Social Connections:   . Frequency of Communication with Friends and Family:   . Frequency of Social Gatherings with Friends and Family:   . Attends Religious Services:   . Active Member of Clubs or Organizations:   . Attends Archivist Meetings:   Marland Kitchen Marital Status:   Intimate Partner Violence:   . Fear of Current or Ex-Partner:   . Emotionally Abused:   Marland Kitchen Physically Abused:   . Sexually Abused:     Outpatient Medications Prior to Visit  Medication Sig Dispense Refill  . acetaminophen (TYLENOL) 325 MG tablet Take 650 mg by mouth every 4 (four) hours as needed.    Marland Kitchen amLODipine (NORVASC) 2.5 MG tablet Take 2.5 mg by  mouth daily.     . calcium-vitamin D (OSCAL WITH D) 500-200 MG-UNIT tablet Take by mouth daily.    . celecoxib (CELEBREX) 200 MG capsule Take 200 mg by mouth daily.    Marland Kitchen dicyclomine (BENTYL) 10 MG capsule Take 10 mg by mouth 4 (four) times daily -  before meals and at bedtime.    Marland Kitchen estradiol (ESTRACE) 0.5 MG tablet Take 0.5 mg by mouth every other day.    . furosemide (LASIX) 20 MG tablet     . levothyroxine (SYNTHROID) 50 MCG tablet     . losartan-hydrochlorothiazide (HYZAAR) 50-12.5 MG tablet Take 1 tablet by mouth daily.     . meclizine (ANTIVERT) 25 MG tablet     . metFORMIN (GLUCOPHAGE) 500 MG tablet     . Multiple Vitamin (MULTIVITAMIN) tablet Take 1 tablet by mouth daily.    . Omega-3 Fatty Acids (FISH OIL PO) Take by mouth.    . ondansetron (ZOFRAN) 4 MG tablet Take 4 mg by mouth 4 (four) times daily as needed for nausea or vomiting.    Glory Rosebush ULTRA test strip     . polyethylene glycol (MIRALAX / GLYCOLAX) 17 g packet Take 17 g by mouth daily.    . pregabalin (LYRICA) 300 MG capsule     . rosuvastatin (CRESTOR) 5 MG tablet     . SAVELLA 100 MG TABS tablet     . traMADol (ULTRAM) 50 MG tablet Take by mouth every 4 (four) hours as needed.    . valACYclovir (VALTREX) 1000 MG tablet Take 1,000 mg by mouth 3 (three) times daily.    . vitamin B-12 (CYANOCOBALAMIN) 1000 MCG tablet Take 1,000 mcg by mouth daily.    . hydrochlorothiazide (HYDRODIURIL) 25 MG tablet Take 25 mg by mouth daily.     No facility-administered medications prior to visit.    Allergies  Allergen Reactions  . Contrast Media [Iodinated Diagnostic Agents] Other (See Comments)    Cardiac arrest  . Codeine   . Crestor [Rosuvastatin] Other (See Comments)    Myalgia   . Lisinopril   . Naproxen     constipation  . Niaspan [Niacin] Other (See Comments)    flushing  . Penicillins     ROS Review of Systems  Constitutional: Negative for chills, fatigue and fever.  HENT: Negative for congestion, ear pain,  rhinorrhea and sore throat.   Respiratory: Negative for cough and shortness of breath.   Cardiovascular: Negative for chest pain.  Gastrointestinal: Negative for abdominal pain, constipation, diarrhea, nausea and vomiting.  Genitourinary: Positive for dysuria,  frequency and urgency.  Musculoskeletal: Negative for back pain and myalgias.  Neurological: Negative for dizziness, weakness, light-headedness and headaches.  Psychiatric/Behavioral: Negative for dysphoric mood. The patient is not nervous/anxious.       Objective:    Physical Exam  Constitutional: She appears well-developed and well-nourished.  Cardiovascular: Normal rate, regular rhythm and normal heart sounds.  Pulmonary/Chest: Effort normal and breath sounds normal.  Neurological: She is alert.  Skin:  Laceration left arm is healing well.  Psychiatric: She has a normal mood and affect. Her behavior is normal.    BP 130/68   Pulse 78   Temp (!) 97.1 F (36.2 C)   Resp 16   Ht 5\' 3"  (1.6 m)   Wt 175 lb 6.4 oz (79.6 kg)   BMI 31.07 kg/m  Wt Readings from Last 3 Encounters:  01/24/20 175 lb 6.4 oz (79.6 kg)  01/09/20 175 lb (79.4 kg)     There are no preventive care reminders to display for this patient.  There are no preventive care reminders to display for this patient.  No results found for: TSH Lab Results  Component Value Date   WBC 5.7 12/24/2007   HGB 13.1 12/24/2007   HCT 38.3 12/24/2007   MCV 93.2 12/24/2007   PLT 219 12/24/2007   Lab Results  Component Value Date   NA 140 12/24/2007   K 3.8 12/24/2007   CO2 31 12/24/2007   GLUCOSE 104 (H) 12/24/2007   BUN 10 12/24/2007   CREATININE 0.84 12/24/2007   CALCIUM 9.6 12/24/2007   No results found for: CHOL No results found for: HDL No results found for: LDLCALC No results found for: TRIG No results found for: CHOLHDL No results found for: HGBA1C    Assessment & Plan:  1. Acute cystitis without hematuria Start on macrobid. - Urine  Culture - POCT urinalysis dipstick  2. Vertigo Meclizine as needed.  3. Essential hypertension, benign The current medical regimen is effective;  continue present plan and medications.   Meds ordered this encounter  Medications  . nitrofurantoin, macrocrystal-monohydrate, (MACROBID) 100 MG capsule    Sig: Take 1 capsule (100 mg total) by mouth 2 (two) times daily.    Dispense:  14 capsule    Refill:  0    Follow-up: No follow-ups on file.    Rochel Brome, MD

## 2020-01-23 NOTE — Assessment & Plan Note (Signed)
Improved

## 2020-01-23 NOTE — Assessment & Plan Note (Signed)
Increased losartan/hctz 50/12.5 mg one twice a day.

## 2020-01-23 NOTE — Progress Notes (Signed)
Subjective:  Patient ID: Cindy Gordon, female    DOB: 02-15-1941  Age: 79 y.o. MRN: [id number]  Chief Complaint  Patient presents with  . Dizziness  . Fall    HPI Patient is an 79 year old white female with history of hypertension, diabetes with neuropathy, hyperlipidemia, and hypothyroidism who presents for follow-up from emergency department visit on January 05, 2020.  The patient had had episodic dizziness for 3 days prior to going to the emergency department which she describes as a spinning sensation specifically with positional changes and turning of her head.  Her dizziness resulted in a fall and a scraped left arm but otherwise no injuries.  Work-up in the emergency department included an EKG which was a chemistry panel significant for elevated AST of 67 and ALT 49, normal CBC, coags.  Urinalysis was positive for nitrites too numerous to count white blood cells 3+ bacteria.  Chest x-ray normal except cardiomegaly.  CT scan of brain no acute changes.  Final diagnoses included UTI, vertigo.  Patient was treated with Cipro 500 mg 1 p.o. twice daily for 5 days and meclizine 25 mg 1 p.o. 4 times daily the as needed dizziness.  She was instructed to hydrate and use Tylenol or ibuprofen as needed for any pain related to her abrasion on her arm.  Patient's blood pressure in the emergency department was 161/69 upon discharge.  No adjustments to her medications were made for her blood pressure.  She presents today on follow-up with continued high blood pressure.  Social Hx   Social History   Socioeconomic History  . Marital status: Widowed    Spouse name: Not on file  . Number of children: 3  . Years of education: Not on file  . Highest education level: Not on file  Occupational History  . Not on file  Tobacco Use  . Smoking status: Never Smoker  . Smokeless tobacco: Never Used  Substance and Sexual Activity  . Alcohol use: Never  . Drug use: Never  . Sexual activity: Not on file    Other Topics Concern  . Not on file  Social History Narrative  . Not on file   Social Determinants of Health   Financial Resource Strain:   . Difficulty of Paying Living Expenses:   Food Insecurity:   . Worried About Charity fundraiser in the Last Year:   . Arboriculturist in the Last Year:   Transportation Needs:   . Film/video editor (Medical):   Marland Kitchen Lack of Transportation (Non-Medical):   Physical Activity:   . Days of Exercise per Week:   . Minutes of Exercise per Session:   Stress:   . Feeling of Stress :   Social Connections:   . Frequency of Communication with Friends and Family:   . Frequency of Social Gatherings with Friends and Family:   . Attends Religious Services:   . Active Member of Clubs or Organizations:   . Attends Archivist Meetings:   Marland Kitchen Marital Status:    Past Medical History:  Diagnosis Date  . Atrophy of thyroid (acquired)   . Carpal tunnel syndrome   . Chronic back pain   . Diabetes mellitus without complication ([place])   . Fibromyalgia   . Gastro-esophageal reflux disease without esophagitis   . Hyperlipidemia   . Hypertension   . Irritable bowel syndrome   . Localized edema   . Localized swelling, mass and lump, neck   . Neuromuscular dysfunction of  bladder, unspecified   . Obstructive sleep apnea (adult) (pediatric)   . Other obesity   . Other specified hypothyroidism   . Sciatica, right side   . Urge incontinence   . Vitamin B 12 deficiency   . Vitamin D deficiency    Family History  Problem Relation Age of Onset  . Alzheimer's disease Mother   . CVA Mother   . Alzheimer's disease Sister   . Diabetes Sister   . Hypertension Sister   . Hypothyroidism Sister   . Congenital heart disease Sister   . Prostate cancer Son   . Coronary artery disease Daughter   . Peptic Ulcer Other   . Diabetes type II Other   . Cystic fibrosis Grandson     Review of Systems  Constitutional: Negative for chills, fatigue and fever.   HENT: Negative for congestion, ear pain and sore throat.   Respiratory: Negative for cough and shortness of breath.   Cardiovascular: Negative for chest pain.  Neurological: Positive for dizziness and headaches.     Objective:  BP 140/70   Pulse 80   Temp (!) 97 F (36.1 C)   Ht 5\' 3"  (1.6 m)   Wt 175 lb (79.4 kg)   BMI 31.00 kg/m   BP/Weight 0/11/930  Systolic BP 355  Diastolic BP 70  Wt. (Lbs) 175  BMI 31    Physical Exam Vitals reviewed.  Constitutional:      Appearance: Normal appearance. She is obese.  HENT:     Right Ear: Tympanic membrane normal.     Left Ear: Tympanic membrane normal.     Nose: Nose normal.     Mouth/Throat:     Mouth: Mucous membranes are moist.  Cardiovascular:     Rate and Rhythm: Normal rate and regular rhythm.     Heart sounds: Normal heart sounds.  Pulmonary:     Effort: Pulmonary effort is normal. No respiratory distress.     Breath sounds: Normal breath sounds.  Skin:    Comments: Abrasion right arm   Neurological:     Mental Status: She is alert.  Psychiatric:        Mood and Affect: Mood normal.        Behavior: Behavior normal.     Lab Results  Component Value Date   WBC 5.7 12/24/2007   HGB 13.1 12/24/2007   HCT 38.3 12/24/2007   PLT 219 12/24/2007   GLUCOSE 104 (H) 12/24/2007   NA 140 12/24/2007   K 3.8 12/24/2007   CL 104 12/24/2007   CREATININE 0.84 12/24/2007   BUN 10 12/24/2007   CO2 31 12/24/2007      Assessment & Plan:  Essential hypertension, benign Increased losartan/hctz 50/12.5 mg one twice a day.   Acute cystitis with hematuria Complete cipro  Vertigo Improved.   Abrasion of right arm Wash with antibacterial soap daily. Apply antibiotic ointment daily until heals.  Fall on same level Caution. Use walker/cane if needed.   Follow-up: Return in about 2 weeks (around 01/23/2020).  An After Visit Summary was printed and given to the patient.  Rochel Brome [facility] [phone]

## 2020-01-23 NOTE — Assessment & Plan Note (Signed)
Wash with antibacterial soap daily. Apply antibiotic ointment daily until heals.

## 2020-01-24 ENCOUNTER — Encounter: Payer: Self-pay | Admitting: Family Medicine

## 2020-01-24 ENCOUNTER — Ambulatory Visit (INDEPENDENT_AMBULATORY_CARE_PROVIDER_SITE_OTHER): Payer: PPO | Admitting: Family Medicine

## 2020-01-24 ENCOUNTER — Other Ambulatory Visit: Payer: Self-pay

## 2020-01-24 VITALS — BP 130/68 | HR 78 | Temp 97.1°F | Resp 16 | Ht 63.0 in | Wt 175.4 lb

## 2020-01-24 DIAGNOSIS — I10 Essential (primary) hypertension: Secondary | ICD-10-CM | POA: Diagnosis not present

## 2020-01-24 DIAGNOSIS — R42 Dizziness and giddiness: Secondary | ICD-10-CM

## 2020-01-24 DIAGNOSIS — N3000 Acute cystitis without hematuria: Secondary | ICD-10-CM

## 2020-01-24 DIAGNOSIS — R300 Dysuria: Secondary | ICD-10-CM | POA: Diagnosis not present

## 2020-01-24 LAB — POCT URINALYSIS DIPSTICK
Bilirubin, UA: NEGATIVE
Blood, UA: NEGATIVE
Glucose, UA: NEGATIVE
Ketones, UA: NEGATIVE
Nitrite, UA: NEGATIVE
Protein, UA: POSITIVE — AB
Spec Grav, UA: 1.015 (ref 1.010–1.025)
Urobilinogen, UA: NEGATIVE E.U./dL — AB
pH, UA: 6 (ref 5.0–8.0)

## 2020-01-24 MED ORDER — NITROFURANTOIN MONOHYD MACRO 100 MG PO CAPS: 100.0000 mg | ORAL_CAPSULE | Freq: Two times a day (BID) | ORAL | 0 refills | Status: DC

## 2020-01-25 LAB — URINE CULTURE: Organism ID, Bacteria: NO GROWTH

## 2020-02-04 ENCOUNTER — Other Ambulatory Visit: Payer: Self-pay | Admitting: Family Medicine

## 2020-02-08 ENCOUNTER — Encounter: Payer: Self-pay | Admitting: Family Medicine

## 2020-02-09 DIAGNOSIS — H2512 Age-related nuclear cataract, left eye: Secondary | ICD-10-CM | POA: Diagnosis not present

## 2020-02-09 DIAGNOSIS — H16223 Keratoconjunctivitis sicca, not specified as Sjogren's, bilateral: Secondary | ICD-10-CM | POA: Diagnosis not present

## 2020-02-09 DIAGNOSIS — H25042 Posterior subcapsular polar age-related cataract, left eye: Secondary | ICD-10-CM | POA: Diagnosis not present

## 2020-02-09 DIAGNOSIS — H278 Other specified disorders of lens: Secondary | ICD-10-CM | POA: Diagnosis not present

## 2020-02-09 DIAGNOSIS — H47323 Drusen of optic disc, bilateral: Secondary | ICD-10-CM | POA: Diagnosis not present

## 2020-02-15 ENCOUNTER — Other Ambulatory Visit: Payer: Self-pay

## 2020-02-15 ENCOUNTER — Other Ambulatory Visit: Payer: Self-pay | Admitting: Family Medicine

## 2020-02-15 MED ORDER — TRAMADOL HCL 50 MG PO TABS: 50.0000 mg | ORAL_TABLET | ORAL | 0 refills | Status: DC | PRN

## 2020-02-17 ENCOUNTER — Ambulatory Visit: Payer: PPO | Admitting: Family Medicine

## 2020-02-24 ENCOUNTER — Ambulatory Visit: Payer: PPO | Admitting: Family Medicine

## 2020-02-28 ENCOUNTER — Telehealth: Payer: Self-pay | Admitting: Family Medicine

## 2020-02-28 NOTE — Progress Notes (Signed)
°  Chronic Care Management   Outreach Note  02/28/2020 Name: Cindy Gordon MRN: [id number] DOB: 04-20-1941  Referred by: Rochel Brome, MD Reason for referral : No chief complaint on file.   An unsuccessful telephone outreach was attempted today. The patient was referred to the pharmacist for assistance with care management and care coordination.   Follow Up Plan:   Earney [place] Upstream Scheduler

## 2020-02-29 DIAGNOSIS — M533 Sacrococcygeal disorders, not elsewhere classified: Secondary | ICD-10-CM | POA: Diagnosis not present

## 2020-03-05 ENCOUNTER — Telehealth: Payer: Self-pay

## 2020-03-05 NOTE — Telephone Encounter (Signed)
The pt left a message on Friday around closing time stating that she had a missed call about setting up an appointment. I called the pt back this morning an left a message for her to call back to schedule an appointment.  (Note: the pt already has an upcoming appointment with Dr.Cox on 03/13/2020.)

## 2020-03-06 ENCOUNTER — Telehealth: Payer: Self-pay | Admitting: Family Medicine

## 2020-03-06 NOTE — Progress Notes (Signed)
  Chronic Care Management   Outreach Note  03/06/2020 Name: Jhaniya Briski MRN: [id number] DOB: [birth year]  Referred by: Rochel Brome, MD Reason for referral : No chief complaint on file.   An unsuccessful telephone outreach was attempted today. The patient was referred to the pharmacist for assistance with care management and care coordination.  This note is not being shared with the patient for the following reason: To respect privacy (The patient or proxy has requested that the information not be shared). Follow Up Plan:   Earney [place] Upstream Scheduler

## 2020-03-08 DIAGNOSIS — S62011A Displaced fracture of distal pole of navicular [scaphoid] bone of right wrist, initial encounter for closed fracture: Secondary | ICD-10-CM | POA: Diagnosis not present

## 2020-03-12 NOTE — Progress Notes (Signed)
Subjective:  Patient ID: Cindy Gordon, female    DOB: 03-28-1941  Age: 79 y.o. MRN: [id number]  Chief Complaint  Patient presents with  . Hypothyroidism  . Hyperlipidemia  . Hypertension  . Diabetes    HPI Pt presents with hyperlipidemia.  Current treatment includes Crestor and fish oil.  Compliance with treatment has been good; she takes her medication as directed, maintains her low cholesterol diet, and follows up as directed.  She denies experiencing any hypercholesterolemia related symptoms.      Pt presents for follow up of hypertension.  Her current cardiac medication regimen includes a diuretic ( hctz and lasix. ), an angiotensin receptor blocker ( LOSARTAN ), and Amlodipine. Her blood pressure runs 126-169/70-80s. She is tolerating the medication well without side effects.  Compliance with treatment has been good; she takes her medication as directed, maintains her diet and exercise regimen, and follows up as directed.      [name] presents with a diagnosis of fibromyalgia.  The course has been stable and nonprogressive.  Taking Lyrica, celebrex, and savella. She continues to ache a lot.     Concerning type 2 diabetes mellitus with other specified complication, specifically, this is type 2, non-insulin requiring diabetes complications of htn and hyperlipidemia.  Compliance with treatment has been good; she takes her medication as directed, maintains her diet and exercise regimen, follows up as directed, and is keeping a glucose diary.  Current meds include an oral hypoglycemic ( Glucophage ( 500mg  qd ) ).  She reports home blood glucose readings have averaged fasting readings in the 120-145 mg/dL range. She checks her glucose 3 times per week.  In regard to preventative care, she performs foot self-exams daily and her last ophthalmology exam was in 05/2019.      Concerning other specified hypothyroidism, she is currently taking Levoxyl, 50 mcg daily.    Past Medical History:    Diagnosis Date  . Atrophy of thyroid (acquired)   . Carpal tunnel syndrome   . Chronic back pain   . Diabetes mellitus without complication ([place])   . Fibromyalgia   . Gastro-esophageal reflux disease without esophagitis   . Hyperlipidemia   . Hypertension   . Irritable bowel syndrome   . Localized edema   . Localized swelling, mass and lump, neck   . Neuromuscular dysfunction of bladder, unspecified   . Obstructive sleep apnea (adult) (pediatric)   . Other obesity   . Other specified hypothyroidism   . Sciatica, right side   . Urge incontinence   . Vitamin B 12 deficiency   . Vitamin D deficiency    Past Surgical History:  Procedure Laterality Date  . APPENDECTOMY    . BLADDER SUSPENSION    . BREAST BIOPSY     right breast: benign  . carpel tunnel repair Left 08/2014  . CATARACT EXTRACTION  2019   right   . CHOLECYSTECTOMY OPEN  1980   acute panceatitis:requiring removal of stones from pancreatitic duct  . dobutamine nuclear stress test     12/2010  . HYSTERECTOMY ABDOMINAL WITH SALPINGO-OOPHORECTOMY    . LUMBAR LAMINECTOMY    . RECTOCELE REPAIR  02/2011  . REVERSE TOTAL SHOULDER ARTHROPLASTY  07/2017  . TOTAL SHOULDER REPLACEMENT  2002   secondary to staph infection  . VEIN LIGATION AND STRIPPING      Family History  Problem Relation Age of Onset  . Alzheimer's disease Mother   . CVA Mother   . Alzheimer's disease Sister   .  Diabetes Sister   . Hypertension Sister   . Hypothyroidism Sister   . Congenital heart disease Sister   . Prostate cancer Son   . Coronary artery disease Daughter   . Peptic Ulcer Other   . Diabetes type II Other   . Cystic fibrosis Grandson    Social History   Socioeconomic History  . Marital status: Widowed    Spouse name: Not on file  . Number of children: 3  . Years of education: Not on file  . Highest education level: Not on file  Occupational History  . Not on file  Tobacco Use  . Smoking status: Never Smoker  .  Smokeless tobacco: Never Used  Substance and Sexual Activity  . Alcohol use: Never  . Drug use: Never  . Sexual activity: Not on file  Other Topics Concern  . Not on file  Social History Narrative  . Not on file   Social Determinants of Health   Financial Resource Strain:   . Difficulty of Paying Living Expenses:   Food Insecurity:   . Worried About Charity fundraiser in the Last Year:   . Arboriculturist in the Last Year:   Transportation Needs:   . Film/video editor (Medical):   Marland Kitchen Lack of Transportation (Non-Medical):   Physical Activity:   . Days of Exercise per Week:   . Minutes of Exercise per Session:   Stress:   . Feeling of Stress :   Social Connections:   . Frequency of Communication with Friends and Family:   . Frequency of Social Gatherings with Friends and Family:   . Attends Religious Services:   . Active Member of Clubs or Organizations:   . Attends Archivist Meetings:   Marland Kitchen Marital Status:     Review of Systems  Constitutional: Negative for chills, fatigue and fever.  HENT: Negative for congestion, ear pain, rhinorrhea and sore throat.   Respiratory: Negative for cough and shortness of breath.   Cardiovascular: Negative for chest pain.  Gastrointestinal: Negative for abdominal pain, constipation, diarrhea, nausea and vomiting.  Genitourinary: Negative for dysuria and urgency.       Urinary Incont.  Musculoskeletal: Positive for back pain. Negative for myalgias.  Neurological: Negative for dizziness, weakness, light-headedness and headaches.  Psychiatric/Behavioral: Negative for dysphoric mood. The patient is not nervous/anxious.      Objective:  BP 136/70   Pulse 72   Temp (!) 97.1 F (36.2 C)   Ht 5\' 2"  (1.575 m)   Wt 176 lb 6.4 oz (80 kg)   SpO2 98%   BMI 32.26 kg/m   BP/Weight 03/13/2020 12/22/9415 4/0/8144  Systolic BP 818 563 149  Diastolic BP 70 68 70  Wt. (Lbs) 176.4 175.4 175  BMI 32.26 31.07 31    Physical  Exam Constitutional:      Appearance: Normal appearance.  Neck:     Vascular: No carotid bruit.  Cardiovascular:     Rate and Rhythm: Normal rate and regular rhythm.     Heart sounds: Normal heart sounds.  Pulmonary:     Effort: Pulmonary effort is normal.     Breath sounds: Normal breath sounds.  Abdominal:     General: Abdomen is flat. Bowel sounds are normal.     Palpations: Abdomen is soft.     Tenderness: There is no abdominal tenderness.  Musculoskeletal:     Cervical back: Normal range of motion.  Neurological:     Mental Status:  She is alert and oriented to person, place, and time.  Psychiatric:        Mood and Affect: Mood normal.        Behavior: Behavior normal.    Diabetic Foot Exam - Simple   Simple Foot Form Diabetic Foot exam was performed with the following findings: Yes 03/12/2020 11:00 AM  Visual Inspection No deformities, no ulcerations, no other skin breakdown bilaterally: Yes Sensation Testing See comments: Yes Pulse Check Posterior Tibialis and Dorsalis pulse intact bilaterally: Yes Comments Decreased sensation BL feet.     Lab Results  Component Value Date   WBC 6.3 03/13/2020   HGB 13.7 03/13/2020   HCT 41.5 03/13/2020   PLT 233 03/13/2020   GLUCOSE 129 (H) 03/13/2020   CHOL 154 03/13/2020   TRIG 174 (H) 03/13/2020   HDL 36 (L) 03/13/2020   LDLCALC 88 03/13/2020   ALT 28 03/13/2020   AST 29 03/13/2020   NA 140 03/13/2020   K 4.5 03/13/2020   CL 102 03/13/2020   CREATININE 0.89 03/13/2020   BUN 19 03/13/2020   CO2 24 03/13/2020   TSH 1.930 03/13/2020   HGBA1C 6.7 (H) 03/13/2020      Assessment & Plan:  1. Essential hypertension, benign Well controlled.  No changes to medicines.  Continue to work on eating a healthy diet and exercise.  Labs drawn today.  - Comprehensive metabolic panel  2. Mixed hyperlipidemia Well controlled.  No changes to medicines.  Continue to work on eating a healthy diet and exercise.  Labs drawn  today.  - Lipid panel  3. Controlled type 2 diabetes mellitus with diabetic polyneuropathy, without long-term current use of insulin (HCC) Control: well controlled Recommend check sugars fasting daily. Recommend check feet daily. Recommend annual eye exams. Medicines: no changes. Continue to work on eating a healthy diet and exercise.  Labs drawn today.   - Hemoglobin A1c - CBC with Differential/Platelet  4. Atrophy of thyroid The current medical regimen is effective;  continue present plan and medications. - TSH  5. Fibromyalgia The current medical regimen is effective;  continue present plan and medications.  6. Gastroesophageal reflux disease without esophagitis The current medical regimen is effective;  continue present plan and medications.  7. Obesity with bmi 32 with serious comorbidity. Recommend continue to work on eating healthy diet and exercise.  Meds ordered this encounter  Medications  . traMADol (ULTRAM) 50 MG tablet    Sig: Take 1 tablet (50 mg total) by mouth every 4 (four) hours as needed.    Dispense:  60 tablet    Refill:  2  . metFORMIN (GLUCOPHAGE) 500 MG tablet    Sig: Take 1 tablet (500 mg total) by mouth daily with breakfast.    Dispense:  90 tablet    Refill:  2  . losartan-hydrochlorothiazide (HYZAAR) 50-12.5 MG tablet    Sig: Take 1 tablet by mouth 2 (two) times daily.    Dispense:  180 tablet    Refill:  1  . pregabalin (LYRICA) 300 MG capsule    Sig: Take 1 capsule (300 mg total) by mouth 2 (two) times daily.    Dispense:  180 capsule    Refill:  1  . solifenacin (VESICARE) 5 MG tablet    Sig: Take 1 tablet (5 mg total) by mouth daily.    Dispense:  30 tablet    Refill:  0    Orders Placed This Encounter  Procedures  . Lipid panel  .  Hemoglobin A1c  . Comprehensive metabolic panel  . CBC with Differential/Platelet  . TSH  . Cardiovascular Risk Assessment     Follow-up: Return in about 3 months (around 06/14/2020).  An After  Visit Summary was printed and given to the patient.  Rochel Brome [facility] [phone]

## 2020-03-13 ENCOUNTER — Encounter: Payer: Self-pay | Admitting: Family Medicine

## 2020-03-13 ENCOUNTER — Other Ambulatory Visit: Payer: Self-pay

## 2020-03-13 ENCOUNTER — Ambulatory Visit (INDEPENDENT_AMBULATORY_CARE_PROVIDER_SITE_OTHER): Payer: PPO | Admitting: Family Medicine

## 2020-03-13 VITALS — BP 136/70 | HR 72 | Temp 97.1°F | Ht 62.0 in | Wt 176.4 lb

## 2020-03-13 DIAGNOSIS — E782 Mixed hyperlipidemia: Secondary | ICD-10-CM | POA: Diagnosis not present

## 2020-03-13 DIAGNOSIS — E1142 Type 2 diabetes mellitus with diabetic polyneuropathy: Secondary | ICD-10-CM | POA: Diagnosis not present

## 2020-03-13 DIAGNOSIS — K219 Gastro-esophageal reflux disease without esophagitis: Secondary | ICD-10-CM | POA: Diagnosis not present

## 2020-03-13 DIAGNOSIS — E034 Atrophy of thyroid (acquired): Secondary | ICD-10-CM

## 2020-03-13 DIAGNOSIS — M797 Fibromyalgia: Secondary | ICD-10-CM | POA: Diagnosis not present

## 2020-03-13 DIAGNOSIS — I10 Essential (primary) hypertension: Secondary | ICD-10-CM

## 2020-03-13 DIAGNOSIS — E6609 Other obesity due to excess calories: Secondary | ICD-10-CM | POA: Diagnosis not present

## 2020-03-13 DIAGNOSIS — Z6832 Body mass index (BMI) 32.0-32.9, adult: Secondary | ICD-10-CM

## 2020-03-13 DIAGNOSIS — E118 Type 2 diabetes mellitus with unspecified complications: Secondary | ICD-10-CM

## 2020-03-13 MED ORDER — METFORMIN HCL 500 MG PO TABS: 500.0000 mg | ORAL_TABLET | Freq: Every day | ORAL | 2 refills | Status: DC

## 2020-03-13 MED ORDER — LOSARTAN POTASSIUM-HCTZ 50-12.5 MG PO TABS: 1.0000 | ORAL_TABLET | Freq: Two times a day (BID) | ORAL | 1 refills | Status: DC

## 2020-03-13 MED ORDER — PREGABALIN 300 MG PO CAPS: 300.0000 mg | ORAL_CAPSULE | Freq: Two times a day (BID) | ORAL | 1 refills | Status: DC

## 2020-03-13 MED ORDER — TRAMADOL HCL 50 MG PO TABS: 50.0000 mg | ORAL_TABLET | ORAL | 2 refills | Status: DC | PRN

## 2020-03-13 MED ORDER — SOLIFENACIN SUCCINATE 5 MG PO TABS
5.0000 mg | ORAL_TABLET | Freq: Every day | ORAL | 0 refills | Status: DC
Start: 2020-03-13 — End: 2020-04-13

## 2020-03-14 LAB — COMPREHENSIVE METABOLIC PANEL
ALT: 28 IU/L (ref 0–32)
AST: 29 IU/L (ref 0–40)
Albumin/Globulin Ratio: 1.5 (ref 1.2–2.2)
Albumin: 3.9 g/dL (ref 3.7–4.7)
Alkaline Phosphatase: 72 IU/L (ref 39–117)
BUN/Creatinine Ratio: 21 (ref 12–28)
BUN: 19 mg/dL (ref 8–27)
Bilirubin Total: 0.3 mg/dL (ref 0.0–1.2)
CO2: 24 mmol/L (ref 20–29)
Calcium: 9.8 mg/dL (ref 8.7–10.3)
Chloride: 102 mmol/L (ref 96–106)
Creatinine, Ser: 0.89 mg/dL (ref 0.57–1.00)
GFR calc Af Amer: 71 mL/min/{1.73_m2} (ref >59)
GFR calc non Af Amer: 61 mL/min/{1.73_m2} (ref >59)
Globulin, Total: 2.6 g/dL (ref 1.5–4.5)
Glucose: 129 mg/dL — ABNORMAL HIGH (ref 65–99)
Potassium: 4.5 mmol/L (ref 3.5–5.2)
Sodium: 140 mmol/L (ref 134–144)
Total Protein: 6.5 g/dL (ref 6.0–8.5)

## 2020-03-14 LAB — CBC WITH DIFFERENTIAL/PLATELET
Basophils Absolute: 0.1 10*3/uL (ref 0.0–0.2)
Basos: 1 %
EOS (ABSOLUTE): 0.2 10*3/uL (ref 0.0–0.4)
Eos: 4 %
Hematocrit: 41.5 % (ref 34.0–46.6)
Hemoglobin: 13.7 g/dL (ref 11.1–15.9)
Immature Grans (Abs): 0 10*3/uL (ref 0.0–0.1)
Immature Granulocytes: 0 %
Lymphocytes Absolute: 2 10*3/uL (ref 0.7–3.1)
Lymphs: 32 %
MCH: 31.4 pg (ref 26.6–33.0)
MCHC: 33 g/dL (ref 31.5–35.7)
MCV: 95 fL (ref 79–97)
Monocytes Absolute: 0.8 10*3/uL (ref 0.1–0.9)
Monocytes: 12 %
Neutrophils Absolute: 3.2 10*3/uL (ref 1.4–7.0)
Neutrophils: 51 %
Platelets: 233 10*3/uL (ref 150–450)
RBC: 4.36 x10E6/uL (ref 3.77–5.28)
RDW: 13.7 % (ref 11.7–15.4)
WBC: 6.3 10*3/uL (ref 3.4–10.8)

## 2020-03-14 LAB — LIPID PANEL
Chol/HDL Ratio: 4.3 ratio (ref 0.0–4.4)
Cholesterol, Total: 154 mg/dL (ref 100–199)
HDL: 36 mg/dL — ABNORMAL LOW (ref >39)
LDL Chol Calc (NIH): 88 mg/dL (ref 0–99)
Triglycerides: 174 mg/dL — ABNORMAL HIGH (ref 0–149)
VLDL Cholesterol Cal: 30 mg/dL (ref 5–40)

## 2020-03-14 LAB — HEMOGLOBIN A1C
Est. average glucose Bld gHb Est-mCnc: 146 mg/dL
Hgb A1c MFr Bld: 6.7 % — ABNORMAL HIGH (ref 4.8–5.6)

## 2020-03-14 LAB — CARDIOVASCULAR RISK ASSESSMENT

## 2020-03-14 LAB — TSH: TSH: 1.93 u[IU]/mL (ref 0.450–4.500)

## 2020-03-15 DIAGNOSIS — H25042 Posterior subcapsular polar age-related cataract, left eye: Secondary | ICD-10-CM | POA: Diagnosis not present

## 2020-03-15 DIAGNOSIS — M797 Fibromyalgia: Secondary | ICD-10-CM | POA: Insufficient documentation

## 2020-03-15 DIAGNOSIS — H16223 Keratoconjunctivitis sicca, not specified as Sjogren's, bilateral: Secondary | ICD-10-CM | POA: Diagnosis not present

## 2020-03-15 DIAGNOSIS — G4733 Obstructive sleep apnea (adult) (pediatric): Secondary | ICD-10-CM | POA: Insufficient documentation

## 2020-03-15 DIAGNOSIS — K219 Gastro-esophageal reflux disease without esophagitis: Secondary | ICD-10-CM | POA: Insufficient documentation

## 2020-03-15 DIAGNOSIS — E6609 Other obesity due to excess calories: Secondary | ICD-10-CM | POA: Insufficient documentation

## 2020-03-15 DIAGNOSIS — E034 Atrophy of thyroid (acquired): Secondary | ICD-10-CM | POA: Insufficient documentation

## 2020-03-15 DIAGNOSIS — H47323 Drusen of optic disc, bilateral: Secondary | ICD-10-CM | POA: Diagnosis not present

## 2020-03-15 DIAGNOSIS — E782 Mixed hyperlipidemia: Secondary | ICD-10-CM | POA: Insufficient documentation

## 2020-03-15 DIAGNOSIS — H278 Other specified disorders of lens: Secondary | ICD-10-CM | POA: Diagnosis not present

## 2020-03-15 DIAGNOSIS — H2512 Age-related nuclear cataract, left eye: Secondary | ICD-10-CM | POA: Diagnosis not present

## 2020-03-15 DIAGNOSIS — E1142 Type 2 diabetes mellitus with diabetic polyneuropathy: Secondary | ICD-10-CM | POA: Insufficient documentation

## 2020-03-30 ENCOUNTER — Other Ambulatory Visit: Payer: Self-pay | Admitting: Family Medicine

## 2020-04-02 DIAGNOSIS — M533 Sacrococcygeal disorders, not elsewhere classified: Secondary | ICD-10-CM | POA: Diagnosis not present

## 2020-04-02 DIAGNOSIS — M7061 Trochanteric bursitis, right hip: Secondary | ICD-10-CM | POA: Diagnosis not present

## 2020-04-13 ENCOUNTER — Other Ambulatory Visit: Payer: Self-pay | Admitting: Family Medicine

## 2020-04-18 ENCOUNTER — Telehealth: Payer: Self-pay | Admitting: Family Medicine

## 2020-04-18 NOTE — Progress Notes (Signed)
  Chronic Care Management   Outreach Note  04/18/2020 Name: Cindy Gordon MRN: [id number] DOB: 1941/07/12  Referred by: Rochel Brome, MD Reason for referral : No chief complaint on file.   An unsuccessful telephone outreach was attempted today. The patient was referred to the pharmacist for assistance with care management and care coordination.   This note is not being shared with the patient for the following reason: To respect privacy (The patient or proxy has requested that the information not be shared).  Follow Up Plan:   Earney [facility] Scheduler

## 2020-06-02 DIAGNOSIS — N3001 Acute cystitis with hematuria: Secondary | ICD-10-CM | POA: Diagnosis not present

## 2020-06-02 DIAGNOSIS — N3090 Cystitis, unspecified without hematuria: Secondary | ICD-10-CM | POA: Diagnosis not present

## 2020-06-14 ENCOUNTER — Encounter: Payer: Self-pay | Admitting: Family Medicine

## 2020-06-14 ENCOUNTER — Ambulatory Visit (INDEPENDENT_AMBULATORY_CARE_PROVIDER_SITE_OTHER): Payer: PPO | Admitting: Family Medicine

## 2020-06-14 ENCOUNTER — Other Ambulatory Visit: Payer: Self-pay

## 2020-06-14 VITALS — BP 130/80 | HR 72 | Temp 97.2°F | Ht 62.0 in | Wt 170.0 lb

## 2020-06-14 DIAGNOSIS — M797 Fibromyalgia: Secondary | ICD-10-CM

## 2020-06-14 DIAGNOSIS — K219 Gastro-esophageal reflux disease without esophagitis: Secondary | ICD-10-CM

## 2020-06-14 DIAGNOSIS — E1142 Type 2 diabetes mellitus with diabetic polyneuropathy: Secondary | ICD-10-CM

## 2020-06-14 DIAGNOSIS — I10 Essential (primary) hypertension: Secondary | ICD-10-CM

## 2020-06-14 DIAGNOSIS — E782 Mixed hyperlipidemia: Secondary | ICD-10-CM | POA: Diagnosis not present

## 2020-06-14 NOTE — Progress Notes (Signed)
Subjective:  Patient ID: Cindy Gordon, female    DOB: 1941-04-19  Age: 79 y.o. MRN: [id number]  Chief Complaint  Patient presents with  . Hyperlipidemia  . Hypertension    HPI Pt presents with hyperlipidemia.  Current treatment includes Crestor and fish oil.  Compliance with treatment has been good; she takes her medication as directed, maintains her low cholesterol diet, and follows up as directed.  She denies experiencing any hypercholesterolemia related symptoms.      Pt presents for follow up of hypertension.  Her current cardiac medication regimen includes a diuretic ( hctz and lasix. ), an angiotensin receptor blocker ( Losartan/hctz ), and Amlodipine. She is tolerating the medication well without side effects.  Compliance with treatment has been good; she takes her medication as directed, maintains her diet and exercise regimen, and follows up as directed.      [name] presents with a diagnosis of fibromyalgia.  The course has been stable and nonprogressive.  Taking Lyrica, celebrex, and savella. She continues to ache a lot.     Concerning type 2 diabetes mellitus with other specified complication, specifically, this is type 2, non-insulin requiring diabetes complications of htn and hyperlipidemia.  Compliance with treatment has been good; she takes her medication as directed, maintains her diet and exercise regimen, follows up as directed, and is keeping a glucose diary.  Current meds include an oral hypoglycemic ( Glucophage ( 500mg  qd ) ).  She reports home blood glucose readings have averaged fasting readings in the 150S mg/dL range. She checks her glucose 3 times per week.  In regard to preventative care, she performs foot self-exams daily and her last ophthalmology exam was in SPRING 2021.      Concerning other specified hypothyroidism, she is currently taking Levoxyl, 50 mcg daily.    OSA: has cpap, but chooses not to wear it. Tolerated, but did not feel it helped. Believe it  was [place] home patient.   Past Medical History:  Diagnosis Date  . Atrophy of thyroid (acquired)   . Carpal tunnel syndrome   . Chronic back pain   . Diabetes mellitus without complication ([place])   . Fibromyalgia   . Gastro-esophageal reflux disease without esophagitis   . Hyperlipidemia   . Hypertension   . Irritable bowel syndrome   . Localized edema   . Localized swelling, mass and lump, neck   . Neuromuscular dysfunction of bladder, unspecified   . Obstructive sleep apnea (adult) (pediatric)   . Other obesity   . Other specified hypothyroidism   . Sciatica, right side   . Urge incontinence   . Vitamin B 12 deficiency   . Vitamin D deficiency    Past Surgical History:  Procedure Laterality Date  . APPENDECTOMY    . BLADDER SUSPENSION    . BREAST BIOPSY     right breast: benign  . carpel tunnel repair Left 08/2014  . CATARACT EXTRACTION  2019   right   . CHOLECYSTECTOMY OPEN  1980   acute panceatitis:requiring removal of stones from pancreatitic duct  . dobutamine nuclear stress test     12/2010  . HYSTERECTOMY ABDOMINAL WITH SALPINGO-OOPHORECTOMY    . LUMBAR LAMINECTOMY    . RECTOCELE REPAIR  02/2011  . REVERSE TOTAL SHOULDER ARTHROPLASTY  07/2017  . TOTAL SHOULDER REPLACEMENT  2002   secondary to staph infection  . VEIN LIGATION AND STRIPPING      Family History  Problem Relation Age of Onset  . Alzheimer's disease  Mother   . CVA Mother   . Alzheimer's disease Sister   . Diabetes Sister   . Hypertension Sister   . Hypothyroidism Sister   . Congenital heart disease Sister   . Prostate cancer Son   . Coronary artery disease Daughter   . Peptic Ulcer Other   . Diabetes type II Other   . Cystic fibrosis Grandson    Social History   Socioeconomic History  . Marital status: Widowed    Spouse name: Not on file  . Number of children: 3  . Years of education: Not on file  . Highest education level: Not on file  Occupational History  . Not on file    Tobacco Use  . Smoking status: Never Smoker  . Smokeless tobacco: Never Used  Vaping Use  . Vaping Use: Never used  Substance and Sexual Activity  . Alcohol use: Never  . Drug use: Never  . Sexual activity: Not on file  Other Topics Concern  . Not on file  Social History Narrative  . Not on file   Social Determinants of Health   Financial Resource Strain:   . Difficulty of Paying Living Expenses:   Food Insecurity:   . Worried About Charity fundraiser in the Last Year:   . Arboriculturist in the Last Year:   Transportation Needs:   . Film/video editor (Medical):   Marland Kitchen Lack of Transportation (Non-Medical):   Physical Activity:   . Days of Exercise per Week:   . Minutes of Exercise per Session:   Stress:   . Feeling of Stress :   Social Connections:   . Frequency of Communication with Friends and Family:   . Frequency of Social Gatherings with Friends and Family:   . Attends Religious Services:   . Active Member of Clubs or Organizations:   . Attends Archivist Meetings:   Marland Kitchen Marital Status:     Review of Systems  Constitutional: Negative for chills, fatigue and fever.  HENT: Negative for congestion, ear pain, rhinorrhea and sore throat.   Respiratory: Positive for shortness of breath. Negative for cough.   Cardiovascular: Negative for chest pain.  Gastrointestinal: Negative for abdominal pain, constipation, diarrhea, nausea and vomiting.  Genitourinary: Negative for dysuria and urgency.       Urinary Incont.  Musculoskeletal: Positive for back pain and myalgias (fibromyalgia.).  Skin: Positive for rash (LEFT SIDE OF ABDOMEN.).  Neurological: Negative for dizziness, weakness, light-headedness and headaches.  Psychiatric/Behavioral: Positive for sleep disturbance (daytime somnolence x few months. ). Negative for dysphoric mood. The patient is not nervous/anxious.      Objective:  BP 130/80   Pulse 72   Temp (!) 97.2 F (36.2 C)   Ht 5\' 2"  (1.575  m)   Wt 170 lb (77.1 kg)   SpO2 96%   BMI 31.09 kg/m   BP/Weight 06/14/2020 03/13/2020 02/05/9241  Systolic BP 683 419 622  Diastolic BP 80 70 68  Wt. (Lbs) 170 176.4 175.4  BMI 31.09 32.26 31.07    Physical Exam Constitutional:      Appearance: Normal appearance.  Neck:     Vascular: No carotid bruit.  Cardiovascular:     Rate and Rhythm: Normal rate and regular rhythm.     Heart sounds: Normal heart sounds.  Pulmonary:     Effort: Pulmonary effort is normal.     Breath sounds: Normal breath sounds.  Abdominal:     General: Abdomen is  flat. Bowel sounds are normal.     Palpations: Abdomen is soft.     Tenderness: There is no abdominal tenderness.  Musculoskeletal:     Cervical back: Normal range of motion.     Comments: FM trigger points diffuse  Skin:    Comments: Macula on left upper abdomen (laterally)  Neurological:     Mental Status: She is alert and oriented to person, place, and time.  Psychiatric:        Mood and Affect: Mood normal.        Behavior: Behavior normal.    Diabetic Foot Exam - Simple   Simple Foot Form Visual Inspection Sensation Testing Pulse Check Comments 2nd digits of BL feet have hammer toes. Dry feet     Lab Results  Component Value Date   WBC 11.3 (H) 06/14/2020   HGB 13.0 06/14/2020   HCT 40.0 06/14/2020   PLT 266 06/14/2020   GLUCOSE 138 (H) 06/14/2020   CHOL 143 06/14/2020   TRIG 131 06/14/2020   HDL 35 (L) 06/14/2020   LDLCALC 85 06/14/2020   ALT 48 (H) 06/14/2020   AST 52 (H) 06/14/2020   NA 134 06/14/2020   K 5.0 06/14/2020   CL 94 (L) 06/14/2020   CREATININE 0.88 06/14/2020   BUN 18 06/14/2020   CO2 27 06/14/2020   TSH 1.930 03/13/2020   HGBA1C 7.1 (H) 06/14/2020      Assessment & Plan:  1. Essential hypertension, benign Well controlled.  No changes to medicines.  Continue to work on eating a healthy diet and exercise.  Labs drawn today.  - Comprehensive metabolic panel  2. Mixed hyperlipidemia Well  controlled.  No changes to medicines.  Continue to work on eating a healthy diet and exercise.  Labs drawn today.  - Lipid panel  3. Controlled type 2 diabetes mellitus with diabetic polyneuropathy, without long-term current use of insulin (HCC) Control: well controlled Recommend check sugars fasting daily. Recommend check feet daily. Recommend annual eye exams. Medicines: no changes. Continue to work on eating a healthy diet and exercise.  Labs drawn today.   - Hemoglobin A1c - CBC with Differential/Platelet  4. Atrophy of thyroid The current medical regimen is effective;  continue present plan and medications. - TSH  5. Fibromyalgia The current medical regimen is effective;  continue present plan and medications.  6. Gastroesophageal reflux disease without esophagitis The current medical regimen is effective;  continue present plan and medications.  7. Obesity with bmi 32 with serious comorbidity. Recommend continue to work on eating healthy diet and exercise.   Orders Placed This Encounter  Procedures  . CBC with Differential/Platelet  . Comprehensive metabolic panel  . Lipid panel  . Hemoglobin A1c  . Cardiovascular Risk Assessment     Follow-up: Return in about 3 months (around 09/14/2020) for fasting visit.  An After Visit Summary was printed and given to the patient.  Rochel Brome [facility] [phone]

## 2020-06-15 LAB — CBC WITH DIFFERENTIAL/PLATELET
Basophils Absolute: 0 10*3/uL (ref 0.0–0.2)
Basos: 0 %
EOS (ABSOLUTE): 0 10*3/uL (ref 0.0–0.4)
Eos: 0 %
Hematocrit: 40 % (ref 34.0–46.6)
Hemoglobin: 13 g/dL (ref 11.1–15.9)
Immature Grans (Abs): 0 10*3/uL (ref 0.0–0.1)
Immature Granulocytes: 0 %
Lymphocytes Absolute: 2 10*3/uL (ref 0.7–3.1)
Lymphs: 18 %
MCH: 30.6 pg (ref 26.6–33.0)
MCHC: 32.5 g/dL (ref 31.5–35.7)
MCV: 94 fL (ref 79–97)
Monocytes Absolute: 0.9 10*3/uL (ref 0.1–0.9)
Monocytes: 8 %
Neutrophils Absolute: 8.4 10*3/uL — ABNORMAL HIGH (ref 1.4–7.0)
Neutrophils: 74 %
Platelets: 266 10*3/uL (ref 150–450)
RBC: 4.25 x10E6/uL (ref 3.77–5.28)
RDW: 13 % (ref 11.7–15.4)
WBC: 11.3 10*3/uL — ABNORMAL HIGH (ref 3.4–10.8)

## 2020-06-15 LAB — LIPID PANEL
Chol/HDL Ratio: 4.1 ratio (ref 0.0–4.4)
Cholesterol, Total: 143 mg/dL (ref 100–199)
HDL: 35 mg/dL — ABNORMAL LOW (ref >39)
LDL Chol Calc (NIH): 85 mg/dL (ref 0–99)
Triglycerides: 131 mg/dL (ref 0–149)
VLDL Cholesterol Cal: 23 mg/dL (ref 5–40)

## 2020-06-15 LAB — COMPREHENSIVE METABOLIC PANEL
ALT: 48 IU/L — ABNORMAL HIGH (ref 0–32)
AST: 52 IU/L — ABNORMAL HIGH (ref 0–40)
Albumin/Globulin Ratio: 1.3 (ref 1.2–2.2)
Albumin: 4.1 g/dL (ref 3.6–4.6)
Alkaline Phosphatase: 68 IU/L (ref 48–121)
BUN/Creatinine Ratio: 20 (ref 12–28)
BUN: 18 mg/dL (ref 8–27)
Bilirubin Total: 0.4 mg/dL (ref 0.0–1.2)
CO2: 27 mmol/L (ref 20–29)
Calcium: 10.2 mg/dL (ref 8.7–10.3)
Chloride: 94 mmol/L — ABNORMAL LOW (ref 96–106)
Creatinine, Ser: 0.88 mg/dL (ref 0.57–1.00)
GFR calc Af Amer: 71 mL/min/{1.73_m2} (ref >59)
GFR calc non Af Amer: 62 mL/min/{1.73_m2} (ref >59)
Globulin, Total: 3.2 g/dL (ref 1.5–4.5)
Glucose: 138 mg/dL — ABNORMAL HIGH (ref 65–99)
Potassium: 5 mmol/L (ref 3.5–5.2)
Sodium: 134 mmol/L (ref 134–144)
Total Protein: 7.3 g/dL (ref 6.0–8.5)

## 2020-06-15 LAB — CARDIOVASCULAR RISK ASSESSMENT

## 2020-06-15 LAB — HEMOGLOBIN A1C
Est. average glucose Bld gHb Est-mCnc: 157 mg/dL
Hgb A1c MFr Bld: 7.1 % — ABNORMAL HIGH (ref 4.8–5.6)

## 2020-06-18 ENCOUNTER — Other Ambulatory Visit: Payer: Self-pay | Admitting: Family Medicine

## 2020-06-18 DIAGNOSIS — M25512 Pain in left shoulder: Secondary | ICD-10-CM | POA: Diagnosis not present

## 2020-06-19 ENCOUNTER — Ambulatory Visit (INDEPENDENT_AMBULATORY_CARE_PROVIDER_SITE_OTHER): Payer: PPO | Admitting: Family Medicine

## 2020-06-19 ENCOUNTER — Encounter: Payer: Self-pay | Admitting: Family Medicine

## 2020-06-19 VITALS — BP 118/62 | HR 82 | Temp 97.4°F | Ht 62.0 in | Wt 171.0 lb

## 2020-06-19 DIAGNOSIS — R300 Dysuria: Secondary | ICD-10-CM

## 2020-06-19 DIAGNOSIS — M25512 Pain in left shoulder: Secondary | ICD-10-CM | POA: Diagnosis not present

## 2020-06-19 DIAGNOSIS — N3001 Acute cystitis with hematuria: Secondary | ICD-10-CM

## 2020-06-19 DIAGNOSIS — M25612 Stiffness of left shoulder, not elsewhere classified: Secondary | ICD-10-CM | POA: Diagnosis not present

## 2020-06-19 LAB — POCT URINALYSIS DIPSTICK
Bilirubin, UA: NEGATIVE
Glucose, UA: NEGATIVE
Ketones, UA: NEGATIVE
Nitrite, UA: NEGATIVE
Protein, UA: POSITIVE — AB
Spec Grav, UA: 1.015 (ref 1.010–1.025)
Urobilinogen, UA: 0.2 E.U./dL
pH, UA: 6 (ref 5.0–8.0)

## 2020-06-19 MED ORDER — NITROFURANTOIN MONOHYD MACRO 100 MG PO CAPS: 100.0000 mg | ORAL_CAPSULE | Freq: Two times a day (BID) | ORAL | 0 refills | Status: DC

## 2020-06-19 MED ORDER — PHENAZOPYRIDINE HCL 200 MG PO TABS
200.0000 mg | ORAL_TABLET | Freq: Three times a day (TID) | ORAL | 0 refills | Status: DC | PRN
Start: 2020-06-19 — End: 2020-07-23

## 2020-06-19 NOTE — Addendum Note (Signed)
Addended by: Alan Ripper A on: 06/19/2020 12:32 PM   Modules accepted: Orders

## 2020-06-19 NOTE — Addendum Note (Signed)
Addended byRochel Brome on: 06/19/2020 10:02 AM   Modules accepted: Orders

## 2020-06-19 NOTE — Progress Notes (Signed)
Acute Office Visit  Subjective:    Patient ID: Cindy Gordon, female    DOB: 03-28-1941, 79 y.o.   MRN: [id number]  No chief complaint on file.   HPI Patient is in today for dysuria x 1 week  Past Medical History:  Diagnosis Date  . Atrophy of thyroid (acquired)   . Carpal tunnel syndrome   . Chronic back pain   . Diabetes mellitus without complication ([place])   . Fibromyalgia   . Gastro-esophageal reflux disease without esophagitis   . Hyperlipidemia   . Hypertension   . Irritable bowel syndrome   . Localized edema   . Localized swelling, mass and lump, neck   . Neuromuscular dysfunction of bladder, unspecified   . Obstructive sleep apnea (adult) (pediatric)   . Other obesity   . Other specified hypothyroidism   . Sciatica, right side   . Urge incontinence   . Vitamin B 12 deficiency   . Vitamin D deficiency     Past Surgical History:  Procedure Laterality Date  . APPENDECTOMY    . BLADDER SUSPENSION    . BREAST BIOPSY     right breast: benign  . carpel tunnel repair Left 08/2014  . CATARACT EXTRACTION  2019   right   . CHOLECYSTECTOMY OPEN  1980   acute panceatitis:requiring removal of stones from pancreatitic duct  . dobutamine nuclear stress test     12/2010  . HYSTERECTOMY ABDOMINAL WITH SALPINGO-OOPHORECTOMY    . LUMBAR LAMINECTOMY    . RECTOCELE REPAIR  02/2011  . REVERSE TOTAL SHOULDER ARTHROPLASTY  07/2017  . TOTAL SHOULDER REPLACEMENT  2002   secondary to staph infection  . VEIN LIGATION AND STRIPPING      Family History  Problem Relation Age of Onset  . Alzheimer's disease Mother   . CVA Mother   . Alzheimer's disease Sister   . Diabetes Sister   . Hypertension Sister   . Hypothyroidism Sister   . Congenital heart disease Sister   . Prostate cancer Son   . Coronary artery disease Daughter   . Peptic Ulcer Other   . Diabetes type II Other   . Cystic fibrosis Grandson     Social History   Socioeconomic History  . Marital status:  Widowed    Spouse name: Not on file  . Number of children: 3  . Years of education: Not on file  . Highest education level: Not on file  Occupational History  . Not on file  Tobacco Use  . Smoking status: Never Smoker  . Smokeless tobacco: Never Used  Vaping Use  . Vaping Use: Never used  Substance and Sexual Activity  . Alcohol use: Never  . Drug use: Never  . Sexual activity: Not on file  Other Topics Concern  . Not on file  Social History Narrative  . Not on file   Social Determinants of Health   Financial Resource Strain:   . Difficulty of Paying Living Expenses:   Food Insecurity:   . Worried About Charity fundraiser in the Last Year:   . Arboriculturist in the Last Year:   Transportation Needs:   . Film/video editor (Medical):   Marland Kitchen Lack of Transportation (Non-Medical):   Physical Activity:   . Days of Exercise per Week:   . Minutes of Exercise per Session:   Stress:   . Feeling of Stress :   Social Connections:   . Frequency of Communication with Friends and  Family:   . Frequency of Social Gatherings with Friends and Family:   . Attends Religious Services:   . Active Member of Clubs or Organizations:   . Attends Archivist Meetings:   Marland Kitchen Marital Status:   Intimate Partner Violence:   . Fear of Current or Ex-Partner:   . Emotionally Abused:   Marland Kitchen Physically Abused:   . Sexually Abused:     Outpatient Medications Prior to Visit  Medication Sig Dispense Refill  . acetaminophen (TYLENOL) 325 MG tablet Take 650 mg by mouth every 4 (four) hours as needed.    Marland Kitchen amLODipine (NORVASC) 2.5 MG tablet Take 2.5 mg by mouth daily.     . calcium-vitamin D (OSCAL WITH D) 500-200 MG-UNIT tablet Take by mouth daily.    . celecoxib (CELEBREX) 200 MG capsule Take 200 mg by mouth daily.    Marland Kitchen dicyclomine (BENTYL) 10 MG capsule Take 10 mg by mouth 4 (four) times daily -  before meals and at bedtime.    Marland Kitchen estradiol (ESTRACE) 0.5 MG tablet Take 0.5 mg by mouth every  other day.    . furosemide (LASIX) 20 MG tablet     . glucose blood (ONETOUCH ULTRA) test strip CHECK BLOOD SUGAR EVERY DAY 100 each 3  . levothyroxine (SYNTHROID) 50 MCG tablet TAKE ONE TABLET BY MOUTH EVERY MORNING 30 MINUTES - 1 HOUR BEFORE BREAKFAST WITH WATER 90 tablet 0  . losartan-hydrochlorothiazide (HYZAAR) 50-12.5 MG tablet TAKE ONE TABLET BY MOUTH EVERY DAY 90 tablet 0  . meclizine (ANTIVERT) 25 MG tablet     . MEDROL 4 MG TBPK tablet Take by mouth.    . metFORMIN (GLUCOPHAGE) 500 MG tablet Take 1 tablet (500 mg total) by mouth daily with breakfast. 90 tablet 2  . Multiple Vitamin (MULTIVITAMIN) tablet Take 1 tablet by mouth daily.    . nitrofurantoin, macrocrystal-monohydrate, (MACROBID) 100 MG capsule Take 1 capsule (100 mg total) by mouth 2 (two) times daily. 14 capsule 0  . Omega-3 Fatty Acids (FISH OIL PO) Take by mouth.    . ondansetron (ZOFRAN) 4 MG tablet Take 4 mg by mouth 4 (four) times daily as needed for nausea or vomiting.    . polyethylene glycol (MIRALAX / GLYCOLAX) 17 g packet Take 17 g by mouth daily.    . pregabalin (LYRICA) 300 MG capsule Take 1 capsule (300 mg total) by mouth 2 (two) times daily. 180 capsule 1  . rosuvastatin (CRESTOR) 5 MG tablet     . SAVELLA 100 MG TABS tablet TAKE ONE TABLET BY MOUTH 2 TIMES A DAY FOR FIBROMYALGIA 180 tablet 3  . solifenacin (VESICARE) 5 MG tablet TAKE ONE TABLET BY MOUTH EVERY DAY 90 tablet 1  . traMADol (ULTRAM) 50 MG tablet Take 1 tablet (50 mg total) by mouth every 4 (four) hours as needed. 60 tablet 2  . valACYclovir (VALTREX) 1000 MG tablet Take 1,000 mg by mouth 3 (three) times daily.    . vitamin B-12 (CYANOCOBALAMIN) 1000 MCG tablet Take 1,000 mcg by mouth daily.     No facility-administered medications prior to visit.    Allergies  Allergen Reactions  . Contrast Media [Iodinated Diagnostic Agents] Other (See Comments)    Cardiac arrest  . Codeine   . Crestor [Rosuvastatin] Other (See Comments)    Myalgia     . Lisinopril   . Naproxen     constipation  . Niaspan [Niacin] Other (See Comments)    flushing  . Penicillins  Review of Systems  Constitutional: Negative for chills and fever.  HENT: Negative for congestion.   Respiratory: Positive for shortness of breath (with exertion).   Cardiovascular: Negative for chest pain.  Gastrointestinal: Negative for nausea and vomiting.  Genitourinary: Positive for difficulty urinating, dysuria, frequency and urgency.       Objective:    Physical Exam Vitals reviewed.  Constitutional:      Appearance: Normal appearance. She is normal weight.  Cardiovascular:     Rate and Rhythm: Normal rate and regular rhythm.     Pulses: Normal pulses.     Heart sounds: Normal heart sounds.  Pulmonary:     Effort: Pulmonary effort is normal. No respiratory distress.     Breath sounds: Normal breath sounds.  Abdominal:     General: Abdomen is flat. Bowel sounds are normal.     Palpations: Abdomen is soft.     Tenderness: There is no abdominal tenderness.  Neurological:     Mental Status: She is alert and oriented to person, place, and time.  Psychiatric:        Mood and Affect: Mood normal.        Behavior: Behavior normal.     There were no vitals taken for this visit. Wt Readings from Last 3 Encounters:  06/14/20 170 lb (77.1 kg)  03/13/20 176 lb 6.4 oz (80 kg)  01/24/20 175 lb 6.4 oz (79.6 kg)    Health Maintenance Due  Topic Date Due  . OPHTHALMOLOGY EXAM  Never done  . INFLUENZA VACCINE  06/10/2020    There are no preventive care reminders to display for this patient.   Lab Results  Component Value Date   TSH 1.930 03/13/2020   Lab Results  Component Value Date   WBC 11.3 (H) 06/14/2020   HGB 13.0 06/14/2020   HCT 40.0 06/14/2020   MCV 94 06/14/2020   PLT 266 06/14/2020   Lab Results  Component Value Date   NA 134 06/14/2020   K 5.0 06/14/2020   CO2 27 06/14/2020   GLUCOSE 138 (H) 06/14/2020   BUN 18 06/14/2020    CREATININE 0.88 06/14/2020   BILITOT 0.4 06/14/2020   ALKPHOS 68 06/14/2020   AST 52 (H) 06/14/2020   ALT 48 (H) 06/14/2020   PROT 7.3 06/14/2020   ALBUMIN 4.1 06/14/2020   CALCIUM 10.2 06/14/2020   Lab Results  Component Value Date   CHOL 143 06/14/2020   Lab Results  Component Value Date   HDL 35 (L) 06/14/2020   Lab Results  Component Value Date   LDLCALC 85 06/14/2020   Lab Results  Component Value Date   TRIG 131 06/14/2020   Lab Results  Component Value Date   CHOLHDL 4.1 06/14/2020   Lab Results  Component Value Date   HGBA1C 7.1 (H) 06/14/2020       Assessment & Plan:  1. Dysuria - Urine Culture - POCT urinalysis dipstick    No orders of the defined types were placed in this encounter.   Orders Placed This Encounter  Procedures  . Urine Culture  . POCT urinalysis dipstick     Follow-up: No follow-ups on file.  An After Visit Summary was printed and given to the patient.  [place] [phone]

## 2020-06-20 ENCOUNTER — Other Ambulatory Visit: Payer: Self-pay

## 2020-06-20 LAB — URINE CULTURE

## 2020-06-20 MED ORDER — ONETOUCH ULTRA MINI W/DEVICE KIT: PACK | 0 refills | Status: DC

## 2020-06-21 ENCOUNTER — Other Ambulatory Visit: Payer: Self-pay

## 2020-06-21 MED ORDER — CEPHALEXIN 500 MG PO CAPS: 500.0000 mg | ORAL_CAPSULE | Freq: Two times a day (BID) | ORAL | 0 refills | Status: DC

## 2020-06-26 DIAGNOSIS — M25612 Stiffness of left shoulder, not elsewhere classified: Secondary | ICD-10-CM | POA: Diagnosis not present

## 2020-06-26 DIAGNOSIS — M25512 Pain in left shoulder: Secondary | ICD-10-CM | POA: Diagnosis not present

## 2020-06-28 ENCOUNTER — Other Ambulatory Visit: Payer: Self-pay

## 2020-06-28 DIAGNOSIS — M25512 Pain in left shoulder: Secondary | ICD-10-CM | POA: Diagnosis not present

## 2020-06-28 DIAGNOSIS — M25612 Stiffness of left shoulder, not elsewhere classified: Secondary | ICD-10-CM | POA: Diagnosis not present

## 2020-07-20 ENCOUNTER — Telehealth: Payer: Self-pay | Admitting: Family Medicine

## 2020-07-20 NOTE — Progress Notes (Signed)
  Chronic Care Management   Outreach Note  07/20/2020 Name: Cindy Gordon MRN: [id number] DOB: December 15, 1940  Referred by: Rochel Brome, MD Reason for referral : No chief complaint on file.   An unsuccessful telephone outreach was attempted today. The patient was referred to the pharmacist for assistance with care management and care coordination.   Follow Up Plan:   Earney [place] [facility] Scheduler

## 2020-07-23 ENCOUNTER — Encounter: Payer: Self-pay | Admitting: Family Medicine

## 2020-07-23 ENCOUNTER — Ambulatory Visit (INDEPENDENT_AMBULATORY_CARE_PROVIDER_SITE_OTHER): Payer: PPO | Admitting: Family Medicine

## 2020-07-23 ENCOUNTER — Other Ambulatory Visit: Payer: Self-pay

## 2020-07-23 VITALS — HR 82 | Temp 97.2°F | Ht 62.0 in | Wt 176.0 lb

## 2020-07-23 DIAGNOSIS — N3941 Urge incontinence: Secondary | ICD-10-CM

## 2020-07-23 DIAGNOSIS — Z23 Encounter for immunization: Secondary | ICD-10-CM | POA: Diagnosis not present

## 2020-07-23 DIAGNOSIS — N3001 Acute cystitis with hematuria: Secondary | ICD-10-CM | POA: Diagnosis not present

## 2020-07-23 DIAGNOSIS — M797 Fibromyalgia: Secondary | ICD-10-CM

## 2020-07-23 LAB — POCT URINALYSIS DIPSTICK
Bilirubin, UA: NEGATIVE
Glucose, UA: NEGATIVE
Ketones, UA: NEGATIVE
Nitrite, UA: NEGATIVE
Protein, UA: POSITIVE — AB
Spec Grav, UA: 1.01 (ref 1.010–1.025)
Urobilinogen, UA: 0.2 E.U./dL
pH, UA: 6 (ref 5.0–8.0)

## 2020-07-23 MED ORDER — SAVELLA 100 MG PO TABS: 100.0000 mg | ORAL_TABLET | Freq: Two times a day (BID) | ORAL | 3 refills | Status: DC

## 2020-07-23 MED ORDER — LOSARTAN POTASSIUM-HCTZ 50-12.5 MG PO TABS
1.0000 | ORAL_TABLET | Freq: Every day | ORAL | 0 refills | Status: DC
Start: 2020-07-23 — End: 2020-10-25

## 2020-07-23 MED ORDER — PHENAZOPYRIDINE HCL 200 MG PO TABS: 200.0000 mg | ORAL_TABLET | Freq: Three times a day (TID) | ORAL | 0 refills | Status: DC | PRN

## 2020-07-23 MED ORDER — METFORMIN HCL 500 MG PO TABS
500.0000 mg | ORAL_TABLET | Freq: Every day | ORAL | 2 refills | Status: DC
Start: 2020-07-23 — End: 2021-06-21

## 2020-07-23 MED ORDER — TRAMADOL HCL 50 MG PO TABS: 50.0000 mg | ORAL_TABLET | Freq: Three times a day (TID) | ORAL | 2 refills | Status: AC | PRN

## 2020-07-23 MED ORDER — SOLIFENACIN SUCCINATE 10 MG PO TABS: 10.0000 mg | ORAL_TABLET | Freq: Every day | ORAL | 0 refills | Status: DC

## 2020-07-23 MED ORDER — POLYETHYLENE GLYCOL 3350 17 G PO PACK: 17.0000 g | PACK | Freq: Every day | ORAL | Status: DC

## 2020-07-23 MED ORDER — LEVOTHYROXINE SODIUM 50 MCG PO TABS
ORAL_TABLET | ORAL | 0 refills | Status: DC
Start: 2020-07-23 — End: 2020-08-08

## 2020-07-23 MED ORDER — CEPHALEXIN 500 MG PO CAPS: 500.0000 mg | ORAL_CAPSULE | Freq: Two times a day (BID) | ORAL | 0 refills | Status: DC

## 2020-07-23 MED ORDER — PREGABALIN 300 MG PO CAPS: 300.0000 mg | ORAL_CAPSULE | Freq: Two times a day (BID) | ORAL | 1 refills | Status: DC

## 2020-07-23 NOTE — Patient Instructions (Addendum)
Bladder incontinence: Increase vesicare 10 mg once daily (may use two 5 mg daily)  If not improving in 2-3 weeks, call and will try alternate or refer to urology.  Stop meloxicam. Start on celebrex.   Bladder infection: send keflex 500 mg one twice a day. X 7 days.

## 2020-07-23 NOTE — Progress Notes (Signed)
Acute Office Visit  Subjective:    Patient ID: Cindy Gordon, female    DOB: 04-01-1941, 79 y.o.   MRN: [id number]  Chief Complaint  Patient presents with  . Urinary Tract Infection    HPI Patient is in today for UTI x 2-3 weeks ago.  denies fever or chills, some pain in her inguinal area, does have to go frequently and dysuria. No back pain.    Urge incontinence and stress incontinence. Taking vesicare 5 mg once daily. Is not helping enough.   Knee gave way and she fell. EMS came to help her up. No injuries. Has appointment with Dr. Samule Dry scheduled later this week.   Pt is requesting list of her medications with associated diagnosis. .   Past Medical History:  Diagnosis Date  . Atrophy of thyroid (acquired)   . Carpal tunnel syndrome   . Chronic back pain   . Diabetes mellitus without complication ([place])   . Fibromyalgia   . Gastro-esophageal reflux disease without esophagitis   . Hyperlipidemia   . Hypertension   . Irritable bowel syndrome   . Localized edema   . Localized swelling, mass and lump, neck   . Neuromuscular dysfunction of bladder, unspecified   . Obstructive sleep apnea (adult) (pediatric)   . Other obesity   . Other specified hypothyroidism   . Sciatica, right side   . Urge incontinence   . Vitamin B 12 deficiency   . Vitamin D deficiency     Past Surgical History:  Procedure Laterality Date  . APPENDECTOMY    . BLADDER SUSPENSION    . BREAST BIOPSY     right breast: benign  . carpel tunnel repair Left 08/2014  . CATARACT EXTRACTION  2019   right   . CHOLECYSTECTOMY OPEN  1980   acute panceatitis:requiring removal of stones from pancreatitic duct  . dobutamine nuclear stress test     12/2010  . HYSTERECTOMY ABDOMINAL WITH SALPINGO-OOPHORECTOMY    . LUMBAR LAMINECTOMY    . RECTOCELE REPAIR  02/2011  . REVERSE TOTAL SHOULDER ARTHROPLASTY  07/2017  . TOTAL SHOULDER REPLACEMENT  2002   secondary to staph infection  . VEIN LIGATION AND  STRIPPING      Family History  Problem Relation Age of Onset  . Alzheimer's disease Mother   . CVA Mother   . Alzheimer's disease Sister   . Diabetes Sister   . Hypertension Sister   . Hypothyroidism Sister   . Congenital heart disease Sister   . Prostate cancer Son   . Coronary artery disease Daughter   . Peptic Ulcer Other   . Diabetes type II Other   . Cystic fibrosis Grandson     Social History   Socioeconomic History  . Marital status: Widowed    Spouse name: Not on file  . Number of children: 3  . Years of education: Not on file  . Highest education level: Not on file  Occupational History  . Not on file  Tobacco Use  . Smoking status: Never Smoker  . Smokeless tobacco: Never Used  Vaping Use  . Vaping Use: Never used  Substance and Sexual Activity  . Alcohol use: Never  . Drug use: Never  . Sexual activity: Not on file  Other Topics Concern  . Not on file  Social History Narrative  . Not on file   Social Determinants of Health   Financial Resource Strain:   . Difficulty of Paying Living Expenses: Not on file  Food Insecurity:   . Worried About Charity fundraiser in the Last Year: Not on file  . Ran Out of Food in the Last Year: Not on file  Transportation Needs:   . Lack of Transportation (Medical): Not on file  . Lack of Transportation (Non-Medical): Not on file  Physical Activity:   . Days of Exercise per Week: Not on file  . Minutes of Exercise per Session: Not on file  Stress:   . Feeling of Stress : Not on file  Social Connections:   . Frequency of Communication with Friends and Family: Not on file  . Frequency of Social Gatherings with Friends and Family: Not on file  . Attends Religious Services: Not on file  . Active Member of Clubs or Organizations: Not on file  . Attends Archivist Meetings: Not on file  . Marital Status: Not on file  Intimate Partner Violence:   . Fear of Current or Ex-Partner: Not on file  . Emotionally  Abused: Not on file  . Physically Abused: Not on file  . Sexually Abused: Not on file    Outpatient Medications Prior to Visit  Medication Sig Dispense Refill  . rosuvastatin (CRESTOR) 5 MG tablet Take 5 mg by mouth daily. For hyperlipidemia     . acetaminophen (TYLENOL) 325 MG tablet Take 650 mg by mouth every 4 (four) hours as needed.    Marland Kitchen amLODipine (NORVASC) 2.5 MG tablet Take 2.5 mg by mouth daily. Hypertension     . Blood Glucose Monitoring Suppl (ONE TOUCH ULTRA MINI) w/Device KIT Use daily to check blood sugar levels. E11.42 1 kit 0  . calcium-vitamin D (OSCAL WITH D) 500-200 MG-UNIT tablet Take 2 tablets by mouth daily. For osteoporosis.    Marland Kitchen dicyclomine (BENTYL) 10 MG capsule Take 10 mg by mouth 4 (four) times daily -  before meals and at bedtime.    Marland Kitchen estradiol (ESTRACE) 0.5 MG tablet Take 0.5 mg by mouth every other day.    Marland Kitchen glucose blood (ONETOUCH ULTRA) test strip CHECK BLOOD SUGAR EVERY DAY 100 each 3  . meclizine (ANTIVERT) 25 MG tablet Take 1 tablet by mouth 3 (three) times daily as needed for dizziness.    . Multiple Vitamin (MULTIVITAMIN) tablet Take 1 tablet by mouth daily.    . nitrofurantoin, macrocrystal-monohydrate, (MACROBID) 100 MG capsule Take 1 capsule (100 mg total) by mouth 2 (two) times daily. 14 capsule 0  . Omega-3 Fatty Acids (FISH OIL PO) Take 1 capsule by mouth daily. For cholesterol    . vitamin B-12 (CYANOCOBALAMIN) 1000 MCG tablet Take 1,000 mcg by mouth daily.    . cephALEXin (KEFLEX) 500 MG capsule Take 1 capsule (500 mg total) by mouth 2 (two) times daily. 14 capsule 0  . furosemide (LASIX) 20 MG tablet     . levothyroxine (SYNTHROID) 50 MCG tablet TAKE ONE TABLET BY MOUTH EVERY MORNING 30 MINUTES - 1 HOUR BEFORE BREAKFAST WITH WATER 90 tablet 0  . losartan-hydrochlorothiazide (HYZAAR) 50-12.5 MG tablet TAKE ONE TABLET BY MOUTH EVERY DAY 90 tablet 0  . MEDROL 4 MG TBPK tablet Take by mouth.    . meloxicam (MOBIC) 7.5 MG tablet Take 7.5 mg by mouth 2  (two) times daily.    . metFORMIN (GLUCOPHAGE) 500 MG tablet Take 1 tablet (500 mg total) by mouth daily with breakfast. 90 tablet 2  . ondansetron (ZOFRAN) 4 MG tablet Take 4 mg by mouth 4 (four) times daily as needed for nausea  or vomiting.    . phenazopyridine (PYRIDIUM) 200 MG tablet Take 1 tablet (200 mg total) by mouth 3 (three) times daily as needed for pain. 10 tablet 0  . polyethylene glycol (MIRALAX / GLYCOLAX) 17 g packet Take 17 g by mouth daily.    . pregabalin (LYRICA) 300 MG capsule Take 1 capsule (300 mg total) by mouth 2 (two) times daily. 180 capsule 1  . SAVELLA 100 MG TABS tablet TAKE ONE TABLET BY MOUTH 2 TIMES A DAY FOR FIBROMYALGIA 180 tablet 3  . solifenacin (VESICARE) 5 MG tablet TAKE ONE TABLET BY MOUTH EVERY DAY 90 tablet 1  . traMADol (ULTRAM) 50 MG tablet Take 1 tablet (50 mg total) by mouth every 4 (four) hours as needed. 60 tablet 2  . valACYclovir (VALTREX) 1000 MG tablet Take 1,000 mg by mouth 3 (three) times daily.     No facility-administered medications prior to visit.    Allergies  Allergen Reactions  . Contrast Media [Iodinated Diagnostic Agents] Other (See Comments)    Cardiac arrest  . Codeine   . Crestor [Rosuvastatin] Other (See Comments)    Myalgia   . Lisinopril   . Naproxen     constipation  . Niaspan [Niacin] Other (See Comments)    flushing  . Penicillins     Review of Systems  Constitutional: Negative for chills, fatigue and fever.  HENT: Negative for congestion, ear pain, rhinorrhea and sore throat.   Respiratory: Negative for cough and shortness of breath.   Cardiovascular: Positive for leg swelling (while at the beach over the last 2 weeks. Started prior to going, but worsened after arrival. Has improved now. ). Negative for chest pain.  Gastrointestinal: Negative for abdominal pain (inguinal pain), nausea and vomiting.  Genitourinary: Positive for difficulty urinating (difficult to get started), dysuria, frequency and urgency.    Musculoskeletal: Negative for back pain.       Objective:    Physical Exam Vitals reviewed.  Constitutional:      Appearance: Normal appearance. She is obese.  Cardiovascular:     Rate and Rhythm: Normal rate and regular rhythm.     Pulses: Normal pulses.     Heart sounds: Normal heart sounds.  Pulmonary:     Effort: Pulmonary effort is normal. No respiratory distress.     Breath sounds: Normal breath sounds.  Abdominal:     General: Abdomen is flat. Bowel sounds are normal.     Palpations: Abdomen is soft.     Tenderness: There is no abdominal tenderness.  Musculoskeletal:     Right lower leg: Edema (trace) present.     Left lower leg: Edema (trace) present.  Neurological:     Mental Status: She is alert and oriented to person, place, and time.  Psychiatric:        Mood and Affect: Mood normal.        Behavior: Behavior normal.     Pulse 82   Temp (!) 97.2 F (36.2 C)   Ht _0  (1.575 m)   Wt 176 lb (79.8 kg)   SpO2 98%   BMI 32.19 kg/m  Wt Readings from Last 3 Encounters:  07/23/20 176 lb (79.8 kg)  06/19/20 171 lb (77.6 kg)  06/14/20 170 lb (77.1 kg)    Health Maintenance Due  Topic Date Due  . OPHTHALMOLOGY EXAM  Never done    There are no preventive care reminders to display for this patient.   Lab Results  Component Value Date  TSH 1.930 03/13/2020   Lab Results  Component Value Date   WBC 11.3 (H) 06/14/2020   HGB 13.0 06/14/2020   HCT 40.0 06/14/2020   MCV 94 06/14/2020   PLT 266 06/14/2020   Lab Results  Component Value Date   NA 134 06/14/2020   K 5.0 06/14/2020   CO2 27 06/14/2020   GLUCOSE 138 (H) 06/14/2020   BUN 18 06/14/2020   CREATININE 0.88 06/14/2020   BILITOT 0.4 06/14/2020   ALKPHOS 68 06/14/2020   AST 52 (H) 06/14/2020   ALT 48 (H) 06/14/2020   PROT 7.3 06/14/2020   ALBUMIN 4.1 06/14/2020   CALCIUM 10.2 06/14/2020   Lab Results  Component Value Date   CHOL 143 06/14/2020   Lab Results  Component Value Date    HDL 35 (L) 06/14/2020   Lab Results  Component Value Date   LDLCALC 85 06/14/2020   Lab Results  Component Value Date   TRIG 131 06/14/2020   Lab Results  Component Value Date   CHOLHDL 4.1 06/14/2020   Lab Results  Component Value Date   HGBA1C 7.1 (H) 06/14/2020       Assessment & Plan:  1. Acute cystitis with hematuria Start on keflex x 7 days - POCT urinalysis dipstick - Urine Culture  2. Fibromyalgia The current medical regimen is fairly effective;  continue present plan and medications. - traMADol (ULTRAM) 50 MG tablet; Take 1 tablet (50 mg total) by mouth every 8 (eight) hours as needed for up to 5 days.  Dispense: 60 tablet; Refill: 2  3. Need for immunization against influenza - Flu Vaccine QUAD High Dose(Fluad)  4. Urge incontinence Increase vesicare to 10 mg once daily. (may use two 5 mg daily) If not improving in 2-3 weeks, call and will try alternate or refer to urology.  Spent a 40 minutes reviewing her medications with patient and adjusting list of medications. Stop meloxicam. Start on celebrex.  Follow-up: Return for previously scheduled appointment coming up. .  An After Visit Summary was printed and given to the patient.  Rochel Brome [facility] [phone]

## 2020-07-25 DIAGNOSIS — M1711 Unilateral primary osteoarthritis, right knee: Secondary | ICD-10-CM | POA: Diagnosis not present

## 2020-07-26 LAB — URINE CULTURE

## 2020-07-30 ENCOUNTER — Other Ambulatory Visit: Payer: Self-pay

## 2020-07-30 DIAGNOSIS — M25512 Pain in left shoulder: Secondary | ICD-10-CM | POA: Diagnosis not present

## 2020-07-30 MED ORDER — NITROFURANTOIN MONOHYD MACRO 100 MG PO CAPS: 100.0000 mg | ORAL_CAPSULE | Freq: Two times a day (BID) | ORAL | 0 refills | Status: DC

## 2020-08-01 DIAGNOSIS — E1140 Type 2 diabetes mellitus with diabetic neuropathy, unspecified: Secondary | ICD-10-CM | POA: Diagnosis not present

## 2020-08-01 DIAGNOSIS — Z7982 Long term (current) use of aspirin: Secondary | ICD-10-CM | POA: Diagnosis not present

## 2020-08-01 DIAGNOSIS — M792 Neuralgia and neuritis, unspecified: Secondary | ICD-10-CM | POA: Diagnosis not present

## 2020-08-01 DIAGNOSIS — R1314 Dysphagia, pharyngoesophageal phase: Secondary | ICD-10-CM | POA: Diagnosis not present

## 2020-08-01 DIAGNOSIS — R3915 Urgency of urination: Secondary | ICD-10-CM | POA: Diagnosis not present

## 2020-08-01 DIAGNOSIS — E785 Hyperlipidemia, unspecified: Secondary | ICD-10-CM | POA: Diagnosis not present

## 2020-08-01 DIAGNOSIS — W540XXD Bitten by dog, subsequent encounter: Secondary | ICD-10-CM | POA: Diagnosis not present

## 2020-08-01 DIAGNOSIS — S62001D Unspecified fracture of navicular [scaphoid] bone of right wrist, subsequent encounter for fracture with routine healing: Secondary | ICD-10-CM | POA: Diagnosis not present

## 2020-08-01 DIAGNOSIS — S52501D Unspecified fracture of the lower end of right radius, subsequent encounter for closed fracture with routine healing: Secondary | ICD-10-CM | POA: Diagnosis not present

## 2020-08-01 DIAGNOSIS — K219 Gastro-esophageal reflux disease without esophagitis: Secondary | ICD-10-CM | POA: Diagnosis not present

## 2020-08-01 DIAGNOSIS — Z79899 Other long term (current) drug therapy: Secondary | ICD-10-CM | POA: Diagnosis not present

## 2020-08-01 DIAGNOSIS — I10 Essential (primary) hypertension: Secondary | ICD-10-CM | POA: Diagnosis not present

## 2020-08-01 DIAGNOSIS — Z9181 History of falling: Secondary | ICD-10-CM | POA: Diagnosis not present

## 2020-08-01 DIAGNOSIS — R351 Nocturia: Secondary | ICD-10-CM | POA: Diagnosis not present

## 2020-08-01 DIAGNOSIS — Z7984 Long term (current) use of oral hypoglycemic drugs: Secondary | ICD-10-CM | POA: Diagnosis not present

## 2020-08-01 DIAGNOSIS — Z79891 Long term (current) use of opiate analgesic: Secondary | ICD-10-CM | POA: Diagnosis not present

## 2020-08-01 DIAGNOSIS — K589 Irritable bowel syndrome without diarrhea: Secondary | ICD-10-CM | POA: Diagnosis not present

## 2020-08-01 DIAGNOSIS — Z87440 Personal history of urinary (tract) infections: Secondary | ICD-10-CM | POA: Diagnosis not present

## 2020-08-01 DIAGNOSIS — D649 Anemia, unspecified: Secondary | ICD-10-CM | POA: Diagnosis not present

## 2020-08-01 DIAGNOSIS — M810 Age-related osteoporosis without current pathological fracture: Secondary | ICD-10-CM | POA: Diagnosis not present

## 2020-08-01 DIAGNOSIS — M797 Fibromyalgia: Secondary | ICD-10-CM | POA: Diagnosis not present

## 2020-08-01 DIAGNOSIS — R2689 Other abnormalities of gait and mobility: Secondary | ICD-10-CM | POA: Diagnosis not present

## 2020-08-01 DIAGNOSIS — W19XXXD Unspecified fall, subsequent encounter: Secondary | ICD-10-CM | POA: Diagnosis not present

## 2020-08-01 DIAGNOSIS — Z792 Long term (current) use of antibiotics: Secondary | ICD-10-CM | POA: Diagnosis not present

## 2020-08-01 DIAGNOSIS — M1991 Primary osteoarthritis, unspecified site: Secondary | ICD-10-CM | POA: Diagnosis not present

## 2020-08-01 DIAGNOSIS — S81012D Laceration without foreign body, left knee, subsequent encounter: Secondary | ICD-10-CM | POA: Diagnosis not present

## 2020-08-01 DIAGNOSIS — N3281 Overactive bladder: Secondary | ICD-10-CM | POA: Diagnosis not present

## 2020-08-01 DIAGNOSIS — E039 Hypothyroidism, unspecified: Secondary | ICD-10-CM | POA: Diagnosis not present

## 2020-08-01 DIAGNOSIS — I739 Peripheral vascular disease, unspecified: Secondary | ICD-10-CM | POA: Diagnosis not present

## 2020-08-01 DIAGNOSIS — R339 Retention of urine, unspecified: Secondary | ICD-10-CM | POA: Diagnosis not present

## 2020-08-01 DIAGNOSIS — I2510 Atherosclerotic heart disease of native coronary artery without angina pectoris: Secondary | ICD-10-CM | POA: Diagnosis not present

## 2020-08-01 DIAGNOSIS — R5383 Other fatigue: Secondary | ICD-10-CM | POA: Diagnosis not present

## 2020-08-01 DIAGNOSIS — E669 Obesity, unspecified: Secondary | ICD-10-CM | POA: Diagnosis not present

## 2020-08-07 DIAGNOSIS — M25512 Pain in left shoulder: Secondary | ICD-10-CM | POA: Diagnosis not present

## 2020-08-07 DIAGNOSIS — S4992XA Unspecified injury of left shoulder and upper arm, initial encounter: Secondary | ICD-10-CM | POA: Diagnosis not present

## 2020-08-07 DIAGNOSIS — I700 Atherosclerosis of aorta: Secondary | ICD-10-CM | POA: Diagnosis not present

## 2020-08-07 DIAGNOSIS — M6258 Muscle wasting and atrophy, not elsewhere classified, other site: Secondary | ICD-10-CM | POA: Diagnosis not present

## 2020-08-07 DIAGNOSIS — Z96612 Presence of left artificial shoulder joint: Secondary | ICD-10-CM | POA: Diagnosis not present

## 2020-08-08 ENCOUNTER — Other Ambulatory Visit: Payer: Self-pay | Admitting: Family Medicine

## 2020-08-31 DIAGNOSIS — M792 Neuralgia and neuritis, unspecified: Secondary | ICD-10-CM | POA: Diagnosis not present

## 2020-08-31 DIAGNOSIS — K589 Irritable bowel syndrome without diarrhea: Secondary | ICD-10-CM | POA: Diagnosis not present

## 2020-08-31 DIAGNOSIS — Z7984 Long term (current) use of oral hypoglycemic drugs: Secondary | ICD-10-CM | POA: Diagnosis not present

## 2020-08-31 DIAGNOSIS — E669 Obesity, unspecified: Secondary | ICD-10-CM | POA: Diagnosis not present

## 2020-08-31 DIAGNOSIS — Z7982 Long term (current) use of aspirin: Secondary | ICD-10-CM | POA: Diagnosis not present

## 2020-08-31 DIAGNOSIS — I10 Essential (primary) hypertension: Secondary | ICD-10-CM | POA: Diagnosis not present

## 2020-08-31 DIAGNOSIS — R339 Retention of urine, unspecified: Secondary | ICD-10-CM | POA: Diagnosis not present

## 2020-08-31 DIAGNOSIS — M797 Fibromyalgia: Secondary | ICD-10-CM | POA: Diagnosis not present

## 2020-08-31 DIAGNOSIS — E1140 Type 2 diabetes mellitus with diabetic neuropathy, unspecified: Secondary | ICD-10-CM | POA: Diagnosis not present

## 2020-08-31 DIAGNOSIS — R3915 Urgency of urination: Secondary | ICD-10-CM | POA: Diagnosis not present

## 2020-08-31 DIAGNOSIS — K219 Gastro-esophageal reflux disease without esophagitis: Secondary | ICD-10-CM | POA: Diagnosis not present

## 2020-08-31 DIAGNOSIS — D649 Anemia, unspecified: Secondary | ICD-10-CM | POA: Diagnosis not present

## 2020-08-31 DIAGNOSIS — Z792 Long term (current) use of antibiotics: Secondary | ICD-10-CM | POA: Diagnosis not present

## 2020-08-31 DIAGNOSIS — E785 Hyperlipidemia, unspecified: Secondary | ICD-10-CM | POA: Diagnosis not present

## 2020-08-31 DIAGNOSIS — Z9181 History of falling: Secondary | ICD-10-CM | POA: Diagnosis not present

## 2020-08-31 DIAGNOSIS — M1991 Primary osteoarthritis, unspecified site: Secondary | ICD-10-CM | POA: Diagnosis not present

## 2020-08-31 DIAGNOSIS — N3281 Overactive bladder: Secondary | ICD-10-CM | POA: Diagnosis not present

## 2020-08-31 DIAGNOSIS — Z87440 Personal history of urinary (tract) infections: Secondary | ICD-10-CM | POA: Diagnosis not present

## 2020-08-31 DIAGNOSIS — I2510 Atherosclerotic heart disease of native coronary artery without angina pectoris: Secondary | ICD-10-CM | POA: Diagnosis not present

## 2020-08-31 DIAGNOSIS — M810 Age-related osteoporosis without current pathological fracture: Secondary | ICD-10-CM | POA: Diagnosis not present

## 2020-08-31 DIAGNOSIS — R1314 Dysphagia, pharyngoesophageal phase: Secondary | ICD-10-CM | POA: Diagnosis not present

## 2020-08-31 DIAGNOSIS — E039 Hypothyroidism, unspecified: Secondary | ICD-10-CM | POA: Diagnosis not present

## 2020-08-31 DIAGNOSIS — I739 Peripheral vascular disease, unspecified: Secondary | ICD-10-CM | POA: Diagnosis not present

## 2020-08-31 DIAGNOSIS — R351 Nocturia: Secondary | ICD-10-CM | POA: Diagnosis not present

## 2020-09-03 ENCOUNTER — Other Ambulatory Visit: Payer: Self-pay | Admitting: Family Medicine

## 2020-09-05 DIAGNOSIS — M25512 Pain in left shoulder: Secondary | ICD-10-CM | POA: Diagnosis not present

## 2020-09-05 DIAGNOSIS — S42112A Displaced fracture of body of scapula, left shoulder, initial encounter for closed fracture: Secondary | ICD-10-CM | POA: Diagnosis not present

## 2020-09-10 DIAGNOSIS — M25512 Pain in left shoulder: Secondary | ICD-10-CM | POA: Diagnosis not present

## 2020-09-14 NOTE — Progress Notes (Signed)
Subjective:  Patient ID: Cindy Gordon, female    DOB: 07/19/1941  Age: 79 y.o. MRN: [id number]  Chief Complaint  Patient presents with  . Hyperlipidemia  . Hypertension  . Diabetes    HPI Pt presents with hyperlipidemia.  Current treatment includes Crestor and fish oil.  Compliance with treatment has been good; she takes her medication as directed, maintains her low cholesterol diet, and follows up as directed.  She denies experiencing any hypercholesterolemia related symptoms.      Pt presents for follow up of hypertension.  Her current cardiac medication regimen includes  an angiotensin receptor blocker ( Losartan/hctz 5012.5 daily ), and Amlodipine 2.5 mg once daily. . She is tolerating the medication well without side effects.  Compliance with treatment has been good; she takes her medication as directed, maintains her diet and exercise regimen, and follows up as directed.      Cindy Gordon presents with a diagnosis of fibromyalgia.  The course has been stable and nonprogressive.  Taking Lyrica, celebrex, and savella. She continues to ache a lot.     Concerning type 2 diabetes mellitus with other specified complication, specifically, this is type 2, non-insulin requiring diabetes complications of htn and hyperlipidemia.  Compliance with treatment has been good; she takes her medication as directed, maintains her diet and exercise regimen, follows up as directed, and is keeping a glucose diary.  Current meds include an oral hypoglycemic ( Glucophage ( 500mg  qd ) ).  She reports home blood glucose readings have averaged fasting readings in the 140S mg/dL range. She checks her glucose 3 times per week.  In regard to preventative care, she performs foot self-exams daily and her last ophthalmology exam was in SPRING 2021.      Concerning other specified hypothyroidism, she is currently taking Levoxyl, 50 mcg daily.    OSA: has cpap. Does not want to wear cpap. Feels like pressure is not right.  Would consider having it adjusted.   Left shoulder pain for 3 months and has worsened in the last 3 weeks. Had left shoulder replacement in 2018 by Dr. Samule Dry.  CT scan of rt shoulder was normal and hardware was in tact. Pt was working with home PT and had severe pain that came on a few days later. Returned to Dr. Samule Dry and said it was a fracture. Dr. Samule Dry referred to Dr. Tamera Punt. He put in a sling and recommended allowing it to heal. Saw Dr. Tamera Punt 2-3 weeks ago. Pain is stable, but not improving. Taking ibuprofen. Patient working on strength and balancing exercises with legs. Unable to do exercises with shoulder per ortho instructions. Intolerant to hydrocodone.   Past Medical History:  Diagnosis Date  . Atrophy of thyroid (acquired)   . Carpal tunnel syndrome   . Chronic back pain   . Diabetes mellitus without complication ([place])   . Fibromyalgia   . Gastro-esophageal reflux disease without esophagitis   . Hyperlipidemia   . Hypertension   . Irritable bowel syndrome   . Localized edema   . Localized swelling, mass and lump, neck   . Neuromuscular dysfunction of bladder, unspecified   . Obstructive sleep apnea (adult) (pediatric)   . Other obesity   . Other specified hypothyroidism   . Sciatica, right side   . Urge incontinence   . Vitamin B 12 deficiency   . Vitamin D deficiency    Past Surgical History:  Procedure Laterality Date  . APPENDECTOMY    . BLADDER SUSPENSION    .  BREAST BIOPSY     right breast: benign  . carpel tunnel repair Left 08/2014  . CATARACT EXTRACTION  2019   right   . CHOLECYSTECTOMY OPEN  1980   acute panceatitis:requiring removal of stones from pancreatitic duct  . dobutamine nuclear stress test     12/2010  . HYSTERECTOMY ABDOMINAL WITH SALPINGO-OOPHORECTOMY    . LUMBAR LAMINECTOMY    . RECTOCELE REPAIR  02/2011  . REVERSE TOTAL SHOULDER ARTHROPLASTY  07/2017  . TOTAL SHOULDER REPLACEMENT  2002   secondary to staph infection  . VEIN LIGATION  AND STRIPPING      Family History  Problem Relation Age of Onset  . Alzheimer's disease Mother   . CVA Mother   . Alzheimer's disease Sister   . Diabetes Sister   . Hypertension Sister   . Hypothyroidism Sister   . Congenital heart disease Sister   . Prostate cancer Son   . Coronary artery disease Daughter   . Peptic Ulcer Other   . Diabetes type II Other   . Cystic fibrosis Grandson    Social History   Socioeconomic History  . Marital status: Widowed    Spouse name: Not on file  . Number of children: 3  . Years of education: Not on file  . Highest education level: Not on file  Occupational History  . Not on file  Tobacco Use  . Smoking status: Never Smoker  . Smokeless tobacco: Never Used  Vaping Use  . Vaping Use: Never used  Substance and Sexual Activity  . Alcohol use: Never  . Drug use: Never  . Sexual activity: Not on file  Other Topics Concern  . Not on file  Social History Narrative  . Not on file   Social Determinants of Health   Financial Resource Strain:   . Difficulty of Paying Living Expenses: Not on file  Food Insecurity:   . Worried About Charity fundraiser in the Last Year: Not on file  . Ran Out of Food in the Last Year: Not on file  Transportation Needs:   . Lack of Transportation (Medical): Not on file  . Lack of Transportation (Non-Medical): Not on file  Physical Activity:   . Days of Exercise per Week: Not on file  . Minutes of Exercise per Session: Not on file  Stress:   . Feeling of Stress : Not on file  Social Connections:   . Frequency of Communication with Friends and Family: Not on file  . Frequency of Social Gatherings with Friends and Family: Not on file  . Attends Religious Services: Not on file  . Active Member of Clubs or Organizations: Not on file  . Attends Archivist Meetings: Not on file  . Marital Status: Not on file    Review of Systems  Constitutional: Positive for fatigue. Negative for chills and  fever.  HENT: Negative for congestion, ear pain, rhinorrhea and sore throat.   Respiratory: Positive for shortness of breath (DOE with walking to mail box. ). Negative for cough.   Cardiovascular: Negative for chest pain and palpitations.  Gastrointestinal: Positive for constipation (controlled with miralax). Negative for abdominal pain, diarrhea, nausea and vomiting.  Genitourinary: Positive for frequency. Negative for dysuria and urgency.  Musculoskeletal: Positive for arthralgias (stress fracture left shoulder ), back pain (lumbar) and myalgias (fibromyalgia.).  Skin: Negative for rash.  Neurological: Negative for dizziness, weakness, light-headedness and headaches.  Psychiatric/Behavioral: Positive for sleep disturbance. Negative for dysphoric mood. The patient  is not nervous/anxious.      Objective:  BP 128/64   Pulse 84   Temp (!) 97.2 F (36.2 C)   Resp 16   Ht 5\' 2"  (1.575 m)   Wt 170 lb 6.4 oz (77.3 kg)   BMI 31.17 kg/m   BP/Weight 09/17/2020 07/23/2020 05/07/3150  Systolic BP 761 - 607  Diastolic BP 64 - 62  Wt. (Lbs) 170.4 176 171  BMI 31.17 32.19 31.28    Physical Exam Constitutional:      General: She is not in acute distress.    Appearance: Normal appearance.  Neck:     Vascular: No carotid bruit.  Cardiovascular:     Rate and Rhythm: Normal rate and regular rhythm.     Pulses: Normal pulses.     Heart sounds: Normal heart sounds.  Pulmonary:     Effort: Pulmonary effort is normal.     Breath sounds: Normal breath sounds.  Abdominal:     General: Abdomen is flat. Bowel sounds are normal.     Palpations: Abdomen is soft.     Tenderness: There is no abdominal tenderness.  Musculoskeletal:     Comments: FM trigger points diffuse. Left shoulder in sling.   Neurological:     Mental Status: She is alert and oriented to person, place, and time.  Psychiatric:        Mood and Affect: Mood normal.        Behavior: Behavior normal.    Diabetic Foot Exam -  Simple   Simple Foot Form Diabetic Foot exam was performed with the following findings: Yes 09/14/2020  1:15 PM  Visual Inspection See comments: Yes Sensation Testing See comments: Yes Pulse Check Posterior Tibialis and Dorsalis pulse intact bilaterally: Yes Comments Calluses. Decreased sensation BL plantar surface of feet.     Lab Results  Component Value Date   WBC 6.7 09/17/2020   HGB 12.7 09/17/2020   HCT 38.9 09/17/2020   PLT 313 09/17/2020   GLUCOSE 120 (H) 09/17/2020   CHOL 125 09/17/2020   TRIG 108 09/17/2020   HDL 36 (L) 09/17/2020   LDLCALC 69 09/17/2020   ALT 21 09/17/2020   AST 17 09/17/2020   NA 138 09/17/2020   K 4.9 09/17/2020   CL 100 09/17/2020   CREATININE 0.94 09/17/2020   BUN 18 09/17/2020   CO2 26 09/17/2020   TSH 1.930 03/13/2020   HGBA1C 6.7 (H) 09/17/2020   MICROALBUR 80 09/17/2020      Assessment & Plan:  1. Essential hypertension, benign Well controlled.  No changes to medicines.  Continue to work on eating a healthy diet and exercise.  Labs drawn today.  - Comprehensive metabolic panel  2. Mixed hyperlipidemia Well controlled.  No changes to medicines.  Continue to work on eating a healthy diet and exercise.  Labs drawn today.  - Lipid panel  3. Controlled type 2 diabetes mellitus with diabetic polyneuropathy, without long-term current use of insulin (HCC) Control: well controlled Recommend check sugars fasting daily. Recommend check feet daily. Recommend annual eye exams. Medicines: no changes. Continue to work on eating a healthy diet and exercise.  Labs drawn today.   - Hemoglobin A1c - CBC with Differential/Platelet  4. Atrophy of thyroid The current medical regimen is effective;  continue present plan and medications.  5. Fibromyalgia The current medical regimen is fairly effective;  continue present plan and medications.  6. Gastroesophageal reflux disease without esophagitis The current medical regimen is  effective;  continue present plan and medications.  7. Other urinary incontinence The current medical regimen is somewhat effective;  continue present plan and medications. - POCT URINALYSIS DIP (CLINITEK)  8. Acute cystitis with hematuria Start macrobid. - nitrofurantoin, macrocrystal-monohydrate, (MACROBID) 100 MG capsule; Take 1 capsule (100 mg total) by mouth 2 (two) times daily.  Dispense: 14 capsule; Refill: 0 - Urine Culture  9. Class 1 obesity due to excess calories with serious comorbidity and body mass index (BMI) of 31.0 to 31.9 in adult Recommend continue to work on eating healthy diet and exercise.  10 fracture left shoulder.  Management per specialist, Dr. Tamera Punt  Instructed pt to call him back as her pain has not improved.  Pt not to take ibuprofen because is taking celebrex.   Orders Placed This Encounter  Procedures  . Urine Culture  . CBC with Differential/Platelet  . Comprehensive metabolic panel  . Hemoglobin A1c  . Lipid panel  . Cardiovascular Risk Assessment  . POCT URINALYSIS DIP (CLINITEK)  . POCT UA - Microalbumin    My nursing staff have aided in the documentation of this note on the behalf of Rochel Brome, MD,as directed by  Rochel Brome, MD and thoroughly reviewed by Rochel Brome, MD.  Follow-up: Return in about 3 months (around 12/18/2020) for fasting. Needs AWV in January 2022.Marland Kitchen  An After Visit Summary was printed and given to the patient.  Rochel Brome [facility] [phone]

## 2020-09-17 ENCOUNTER — Other Ambulatory Visit: Payer: Self-pay

## 2020-09-17 ENCOUNTER — Ambulatory Visit (INDEPENDENT_AMBULATORY_CARE_PROVIDER_SITE_OTHER): Payer: PPO | Admitting: Family Medicine

## 2020-09-17 ENCOUNTER — Encounter: Payer: Self-pay | Admitting: Family Medicine

## 2020-09-17 VITALS — BP 128/64 | HR 84 | Temp 97.2°F | Resp 16 | Ht 62.0 in | Wt 170.4 lb

## 2020-09-17 DIAGNOSIS — E1142 Type 2 diabetes mellitus with diabetic polyneuropathy: Secondary | ICD-10-CM | POA: Diagnosis not present

## 2020-09-17 DIAGNOSIS — N3001 Acute cystitis with hematuria: Secondary | ICD-10-CM

## 2020-09-17 DIAGNOSIS — E782 Mixed hyperlipidemia: Secondary | ICD-10-CM | POA: Diagnosis not present

## 2020-09-17 DIAGNOSIS — Z6831 Body mass index (BMI) 31.0-31.9, adult: Secondary | ICD-10-CM

## 2020-09-17 DIAGNOSIS — M797 Fibromyalgia: Secondary | ICD-10-CM | POA: Diagnosis not present

## 2020-09-17 DIAGNOSIS — K219 Gastro-esophageal reflux disease without esophagitis: Secondary | ICD-10-CM

## 2020-09-17 DIAGNOSIS — N39498 Other specified urinary incontinence: Secondary | ICD-10-CM | POA: Diagnosis not present

## 2020-09-17 DIAGNOSIS — I10 Essential (primary) hypertension: Secondary | ICD-10-CM

## 2020-09-17 DIAGNOSIS — E034 Atrophy of thyroid (acquired): Secondary | ICD-10-CM

## 2020-09-17 DIAGNOSIS — S42152A Displaced fracture of neck of scapula, left shoulder, initial encounter for closed fracture: Secondary | ICD-10-CM | POA: Diagnosis not present

## 2020-09-17 DIAGNOSIS — S42142A Displaced fracture of glenoid cavity of scapula, left shoulder, initial encounter for closed fracture: Secondary | ICD-10-CM

## 2020-09-17 DIAGNOSIS — E6609 Other obesity due to excess calories: Secondary | ICD-10-CM

## 2020-09-17 LAB — POCT URINALYSIS DIP (CLINITEK)
Bilirubin, UA: NEGATIVE
Glucose, UA: NEGATIVE mg/dL
Ketones, POC UA: NEGATIVE mg/dL
Nitrite, UA: POSITIVE — AB
POC PROTEIN,UA: 100 — AB
Spec Grav, UA: 1.015 (ref 1.010–1.025)
Urobilinogen, UA: 0.2 E.U./dL
pH, UA: 6 (ref 5.0–8.0)

## 2020-09-17 LAB — POCT UA - MICROALBUMIN: Microalbumin Ur, POC: 80 mg/L

## 2020-09-17 MED ORDER — TRAMADOL HCL 50 MG PO TABS: 50.0000 mg | ORAL_TABLET | Freq: Two times a day (BID) | ORAL | 2 refills | Status: AC

## 2020-09-17 MED ORDER — NITROFURANTOIN MONOHYD MACRO 100 MG PO CAPS: 100.0000 mg | ORAL_CAPSULE | Freq: Two times a day (BID) | ORAL | 0 refills | Status: DC

## 2020-09-17 NOTE — Patient Instructions (Addendum)
Bladder infection: Take macrobid 100 mg one twice a day x 1 week.  Bladder incontinence: Consider Stopping vesicare and Starting myrebetriq 50 mg once daily. Wait until bladder infection treated and then recheck urinalysis (nurse visit. )   Left shoulder pain: Start on tramadol 50 mg one twice a day.  Continue celebrex 200 mg once daily.  No ibuprofen.  May take tylenol according to bottle.  Call Dr. Bettina Gavia office if not improving to discuss other options.   No other changes to medicines.

## 2020-09-18 LAB — COMPREHENSIVE METABOLIC PANEL
ALT: 21 IU/L (ref 0–32)
AST: 17 IU/L (ref 0–40)
Albumin/Globulin Ratio: 1.4 (ref 1.2–2.2)
Albumin: 3.8 g/dL (ref 3.6–4.6)
Alkaline Phosphatase: 94 IU/L (ref 44–121)
BUN/Creatinine Ratio: 19 (ref 12–28)
BUN: 18 mg/dL (ref 8–27)
Bilirubin Total: 0.3 mg/dL (ref 0.0–1.2)
CO2: 26 mmol/L (ref 20–29)
Calcium: 9.7 mg/dL (ref 8.7–10.3)
Chloride: 100 mmol/L (ref 96–106)
Creatinine, Ser: 0.94 mg/dL (ref 0.57–1.00)
GFR calc Af Amer: 66 mL/min/{1.73_m2} (ref >59)
GFR calc non Af Amer: 57 mL/min/{1.73_m2} — ABNORMAL LOW (ref >59)
Globulin, Total: 2.8 g/dL (ref 1.5–4.5)
Glucose: 120 mg/dL — ABNORMAL HIGH (ref 65–99)
Potassium: 4.9 mmol/L (ref 3.5–5.2)
Sodium: 138 mmol/L (ref 134–144)
Total Protein: 6.6 g/dL (ref 6.0–8.5)

## 2020-09-18 LAB — LIPID PANEL
Chol/HDL Ratio: 3.5 ratio (ref 0.0–4.4)
Cholesterol, Total: 125 mg/dL (ref 100–199)
HDL: 36 mg/dL — ABNORMAL LOW (ref >39)
LDL Chol Calc (NIH): 69 mg/dL (ref 0–99)
Triglycerides: 108 mg/dL (ref 0–149)
VLDL Cholesterol Cal: 20 mg/dL (ref 5–40)

## 2020-09-18 LAB — CBC WITH DIFFERENTIAL/PLATELET
Basophils Absolute: 0 10*3/uL (ref 0.0–0.2)
Basos: 1 %
EOS (ABSOLUTE): 0.1 10*3/uL (ref 0.0–0.4)
Eos: 2 %
Hematocrit: 38.9 % (ref 34.0–46.6)
Hemoglobin: 12.7 g/dL (ref 11.1–15.9)
Immature Grans (Abs): 0 10*3/uL (ref 0.0–0.1)
Immature Granulocytes: 0 %
Lymphocytes Absolute: 2 10*3/uL (ref 0.7–3.1)
Lymphs: 30 %
MCH: 30.5 pg (ref 26.6–33.0)
MCHC: 32.6 g/dL (ref 31.5–35.7)
MCV: 93 fL (ref 79–97)
Monocytes Absolute: 0.7 10*3/uL (ref 0.1–0.9)
Monocytes: 10 %
Neutrophils Absolute: 3.9 10*3/uL (ref 1.4–7.0)
Neutrophils: 57 %
Platelets: 313 10*3/uL (ref 150–450)
RBC: 4.17 x10E6/uL (ref 3.77–5.28)
RDW: 13.2 % (ref 11.7–15.4)
WBC: 6.7 10*3/uL (ref 3.4–10.8)

## 2020-09-18 LAB — HEMOGLOBIN A1C
Est. average glucose Bld gHb Est-mCnc: 146 mg/dL
Hgb A1c MFr Bld: 6.7 % — ABNORMAL HIGH (ref 4.8–5.6)

## 2020-09-18 LAB — CARDIOVASCULAR RISK ASSESSMENT

## 2020-09-20 ENCOUNTER — Other Ambulatory Visit: Payer: Self-pay | Admitting: Family Medicine

## 2020-09-20 LAB — URINE CULTURE

## 2020-09-22 DIAGNOSIS — M47814 Spondylosis without myelopathy or radiculopathy, thoracic region: Secondary | ICD-10-CM | POA: Diagnosis not present

## 2020-09-22 DIAGNOSIS — M25461 Effusion, right knee: Secondary | ICD-10-CM | POA: Diagnosis not present

## 2020-09-22 DIAGNOSIS — K269 Duodenal ulcer, unspecified as acute or chronic, without hemorrhage or perforation: Secondary | ICD-10-CM | POA: Diagnosis not present

## 2020-09-22 DIAGNOSIS — M25561 Pain in right knee: Secondary | ICD-10-CM | POA: Diagnosis not present

## 2020-09-22 DIAGNOSIS — R531 Weakness: Secondary | ICD-10-CM | POA: Diagnosis not present

## 2020-09-22 DIAGNOSIS — K2960 Other gastritis without bleeding: Secondary | ICD-10-CM | POA: Diagnosis not present

## 2020-09-22 DIAGNOSIS — S0990XA Unspecified injury of head, initial encounter: Secondary | ICD-10-CM | POA: Diagnosis not present

## 2020-09-22 DIAGNOSIS — N390 Urinary tract infection, site not specified: Secondary | ICD-10-CM | POA: Diagnosis not present

## 2020-09-22 DIAGNOSIS — S199XXA Unspecified injury of neck, initial encounter: Secondary | ICD-10-CM | POA: Diagnosis not present

## 2020-09-22 DIAGNOSIS — M160 Bilateral primary osteoarthritis of hip: Secondary | ICD-10-CM | POA: Diagnosis not present

## 2020-09-22 DIAGNOSIS — M4184 Other forms of scoliosis, thoracic region: Secondary | ICD-10-CM | POA: Diagnosis not present

## 2020-09-23 DIAGNOSIS — K269 Duodenal ulcer, unspecified as acute or chronic, without hemorrhage or perforation: Secondary | ICD-10-CM | POA: Diagnosis not present

## 2020-09-23 DIAGNOSIS — Z885 Allergy status to narcotic agent status: Secondary | ICD-10-CM | POA: Diagnosis not present

## 2020-09-23 DIAGNOSIS — E039 Hypothyroidism, unspecified: Secondary | ICD-10-CM | POA: Diagnosis not present

## 2020-09-23 DIAGNOSIS — Z888 Allergy status to other drugs, medicaments and biological substances status: Secondary | ICD-10-CM | POA: Diagnosis not present

## 2020-09-23 DIAGNOSIS — R531 Weakness: Secondary | ICD-10-CM | POA: Diagnosis not present

## 2020-09-23 DIAGNOSIS — R0902 Hypoxemia: Secondary | ICD-10-CM | POA: Diagnosis not present

## 2020-09-23 DIAGNOSIS — Z7982 Long term (current) use of aspirin: Secondary | ICD-10-CM | POA: Diagnosis not present

## 2020-09-23 DIAGNOSIS — M25461 Effusion, right knee: Secondary | ICD-10-CM | POA: Diagnosis not present

## 2020-09-23 DIAGNOSIS — E860 Dehydration: Secondary | ICD-10-CM | POA: Diagnosis not present

## 2020-09-23 DIAGNOSIS — S199XXA Unspecified injury of neck, initial encounter: Secondary | ICD-10-CM | POA: Diagnosis not present

## 2020-09-23 DIAGNOSIS — E119 Type 2 diabetes mellitus without complications: Secondary | ICD-10-CM | POA: Diagnosis not present

## 2020-09-23 DIAGNOSIS — K449 Diaphragmatic hernia without obstruction or gangrene: Secondary | ICD-10-CM | POA: Diagnosis not present

## 2020-09-23 DIAGNOSIS — E1169 Type 2 diabetes mellitus with other specified complication: Secondary | ICD-10-CM | POA: Diagnosis not present

## 2020-09-23 DIAGNOSIS — R079 Chest pain, unspecified: Secondary | ICD-10-CM | POA: Diagnosis not present

## 2020-09-23 DIAGNOSIS — S0990XA Unspecified injury of head, initial encounter: Secondary | ICD-10-CM | POA: Diagnosis not present

## 2020-09-23 DIAGNOSIS — K2960 Other gastritis without bleeding: Secondary | ICD-10-CM | POA: Diagnosis not present

## 2020-09-23 DIAGNOSIS — Z7984 Long term (current) use of oral hypoglycemic drugs: Secondary | ICD-10-CM | POA: Diagnosis not present

## 2020-09-23 DIAGNOSIS — M25561 Pain in right knee: Secondary | ICD-10-CM | POA: Diagnosis not present

## 2020-09-23 DIAGNOSIS — E785 Hyperlipidemia, unspecified: Secondary | ICD-10-CM | POA: Diagnosis not present

## 2020-09-23 DIAGNOSIS — M160 Bilateral primary osteoarthritis of hip: Secondary | ICD-10-CM | POA: Diagnosis not present

## 2020-09-23 DIAGNOSIS — R2689 Other abnormalities of gait and mobility: Secondary | ICD-10-CM | POA: Diagnosis not present

## 2020-09-23 DIAGNOSIS — M1990 Unspecified osteoarthritis, unspecified site: Secondary | ICD-10-CM | POA: Diagnosis not present

## 2020-09-23 DIAGNOSIS — R1310 Dysphagia, unspecified: Secondary | ICD-10-CM | POA: Diagnosis not present

## 2020-09-23 DIAGNOSIS — Z79899 Other long term (current) drug therapy: Secondary | ICD-10-CM | POA: Diagnosis not present

## 2020-09-23 DIAGNOSIS — I10 Essential (primary) hypertension: Secondary | ICD-10-CM | POA: Diagnosis not present

## 2020-09-23 DIAGNOSIS — Z880 Allergy status to penicillin: Secondary | ICD-10-CM | POA: Diagnosis not present

## 2020-09-23 DIAGNOSIS — K319 Disease of stomach and duodenum, unspecified: Secondary | ICD-10-CM | POA: Diagnosis not present

## 2020-09-23 DIAGNOSIS — N390 Urinary tract infection, site not specified: Secondary | ICD-10-CM | POA: Diagnosis not present

## 2020-09-23 DIAGNOSIS — M25571 Pain in right ankle and joints of right foot: Secondary | ICD-10-CM | POA: Diagnosis not present

## 2020-09-23 DIAGNOSIS — M159 Polyosteoarthritis, unspecified: Secondary | ICD-10-CM | POA: Diagnosis not present

## 2020-09-23 DIAGNOSIS — M47814 Spondylosis without myelopathy or radiculopathy, thoracic region: Secondary | ICD-10-CM | POA: Diagnosis not present

## 2020-09-23 DIAGNOSIS — W19XXXD Unspecified fall, subsequent encounter: Secondary | ICD-10-CM | POA: Diagnosis not present

## 2020-09-23 DIAGNOSIS — M4184 Other forms of scoliosis, thoracic region: Secondary | ICD-10-CM | POA: Diagnosis not present

## 2020-09-23 DIAGNOSIS — K267 Chronic duodenal ulcer without hemorrhage or perforation: Secondary | ICD-10-CM | POA: Diagnosis not present

## 2020-09-23 DIAGNOSIS — R1319 Other dysphagia: Secondary | ICD-10-CM | POA: Diagnosis not present

## 2020-09-25 DIAGNOSIS — K319 Disease of stomach and duodenum, unspecified: Secondary | ICD-10-CM | POA: Diagnosis not present

## 2020-09-25 DIAGNOSIS — K449 Diaphragmatic hernia without obstruction or gangrene: Secondary | ICD-10-CM | POA: Diagnosis not present

## 2020-09-25 DIAGNOSIS — K269 Duodenal ulcer, unspecified as acute or chronic, without hemorrhage or perforation: Secondary | ICD-10-CM | POA: Diagnosis not present

## 2020-09-27 DIAGNOSIS — M25512 Pain in left shoulder: Secondary | ICD-10-CM | POA: Diagnosis not present

## 2020-09-27 DIAGNOSIS — E039 Hypothyroidism, unspecified: Secondary | ICD-10-CM | POA: Diagnosis not present

## 2020-09-27 DIAGNOSIS — M1990 Unspecified osteoarthritis, unspecified site: Secondary | ICD-10-CM | POA: Diagnosis not present

## 2020-09-27 DIAGNOSIS — R079 Chest pain, unspecified: Secondary | ICD-10-CM | POA: Diagnosis not present

## 2020-09-27 DIAGNOSIS — I119 Hypertensive heart disease without heart failure: Secondary | ICD-10-CM | POA: Diagnosis not present

## 2020-09-27 DIAGNOSIS — E785 Hyperlipidemia, unspecified: Secondary | ICD-10-CM | POA: Diagnosis not present

## 2020-09-27 DIAGNOSIS — R1319 Other dysphagia: Secondary | ICD-10-CM | POA: Diagnosis not present

## 2020-09-27 DIAGNOSIS — K2960 Other gastritis without bleeding: Secondary | ICD-10-CM | POA: Diagnosis not present

## 2020-09-27 DIAGNOSIS — E1169 Type 2 diabetes mellitus with other specified complication: Secondary | ICD-10-CM | POA: Diagnosis not present

## 2020-09-27 DIAGNOSIS — R262 Difficulty in walking, not elsewhere classified: Secondary | ICD-10-CM | POA: Diagnosis not present

## 2020-09-27 DIAGNOSIS — S42112A Displaced fracture of body of scapula, left shoulder, initial encounter for closed fracture: Secondary | ICD-10-CM | POA: Diagnosis not present

## 2020-09-27 DIAGNOSIS — I10 Essential (primary) hypertension: Secondary | ICD-10-CM | POA: Diagnosis not present

## 2020-09-27 DIAGNOSIS — W19XXXD Unspecified fall, subsequent encounter: Secondary | ICD-10-CM | POA: Diagnosis not present

## 2020-09-27 DIAGNOSIS — K269 Duodenal ulcer, unspecified as acute or chronic, without hemorrhage or perforation: Secondary | ICD-10-CM | POA: Diagnosis not present

## 2020-09-27 DIAGNOSIS — N390 Urinary tract infection, site not specified: Secondary | ICD-10-CM | POA: Diagnosis not present

## 2020-09-27 DIAGNOSIS — R1310 Dysphagia, unspecified: Secondary | ICD-10-CM | POA: Diagnosis not present

## 2020-09-27 DIAGNOSIS — R0902 Hypoxemia: Secondary | ICD-10-CM | POA: Diagnosis not present

## 2020-09-27 DIAGNOSIS — E860 Dehydration: Secondary | ICD-10-CM | POA: Diagnosis not present

## 2020-09-27 DIAGNOSIS — R531 Weakness: Secondary | ICD-10-CM | POA: Diagnosis not present

## 2020-09-28 ENCOUNTER — Telehealth: Payer: Self-pay

## 2020-09-28 DIAGNOSIS — R262 Difficulty in walking, not elsewhere classified: Secondary | ICD-10-CM | POA: Diagnosis not present

## 2020-09-28 DIAGNOSIS — I119 Hypertensive heart disease without heart failure: Secondary | ICD-10-CM | POA: Diagnosis not present

## 2020-09-28 DIAGNOSIS — E860 Dehydration: Secondary | ICD-10-CM

## 2020-09-28 DIAGNOSIS — M1990 Unspecified osteoarthritis, unspecified site: Secondary | ICD-10-CM | POA: Diagnosis not present

## 2020-09-28 DIAGNOSIS — N390 Urinary tract infection, site not specified: Secondary | ICD-10-CM | POA: Diagnosis not present

## 2020-09-28 NOTE — Telephone Encounter (Signed)
Cindy Gordon called to report that she was admitted to [facility] for rehabilitation yesterday.  She has a fall and spent 4 hours in the floor.  She did not have a new fracture but was admitted because of weakness and deconditioning.  They plan to keep her for 2 weeks.

## 2020-10-01 ENCOUNTER — Ambulatory Visit: Payer: PPO

## 2020-10-08 DIAGNOSIS — M25512 Pain in left shoulder: Secondary | ICD-10-CM | POA: Diagnosis not present

## 2020-10-08 DIAGNOSIS — S42112A Displaced fracture of body of scapula, left shoulder, initial encounter for closed fracture: Secondary | ICD-10-CM | POA: Diagnosis not present

## 2020-10-24 DIAGNOSIS — E1140 Type 2 diabetes mellitus with diabetic neuropathy, unspecified: Secondary | ICD-10-CM | POA: Diagnosis not present

## 2020-10-24 DIAGNOSIS — E1151 Type 2 diabetes mellitus with diabetic peripheral angiopathy without gangrene: Secondary | ICD-10-CM | POA: Diagnosis not present

## 2020-10-24 DIAGNOSIS — R351 Nocturia: Secondary | ICD-10-CM | POA: Diagnosis not present

## 2020-10-24 DIAGNOSIS — N3281 Overactive bladder: Secondary | ICD-10-CM | POA: Diagnosis not present

## 2020-10-24 DIAGNOSIS — M810 Age-related osteoporosis without current pathological fracture: Secondary | ICD-10-CM | POA: Diagnosis not present

## 2020-10-24 DIAGNOSIS — Z792 Long term (current) use of antibiotics: Secondary | ICD-10-CM | POA: Diagnosis not present

## 2020-10-24 DIAGNOSIS — Z87440 Personal history of urinary (tract) infections: Secondary | ICD-10-CM | POA: Diagnosis not present

## 2020-10-24 DIAGNOSIS — K5900 Constipation, unspecified: Secondary | ICD-10-CM | POA: Diagnosis not present

## 2020-10-24 DIAGNOSIS — R339 Retention of urine, unspecified: Secondary | ICD-10-CM | POA: Diagnosis not present

## 2020-10-24 DIAGNOSIS — R1314 Dysphagia, pharyngoesophageal phase: Secondary | ICD-10-CM | POA: Diagnosis not present

## 2020-10-24 DIAGNOSIS — E669 Obesity, unspecified: Secondary | ICD-10-CM | POA: Diagnosis not present

## 2020-10-24 DIAGNOSIS — Z9181 History of falling: Secondary | ICD-10-CM | POA: Diagnosis not present

## 2020-10-24 DIAGNOSIS — M519 Unspecified thoracic, thoracolumbar and lumbosacral intervertebral disc disorder: Secondary | ICD-10-CM | POA: Diagnosis not present

## 2020-10-24 DIAGNOSIS — K589 Irritable bowel syndrome without diarrhea: Secondary | ICD-10-CM | POA: Diagnosis not present

## 2020-10-24 DIAGNOSIS — R3915 Urgency of urination: Secondary | ICD-10-CM | POA: Diagnosis not present

## 2020-10-24 DIAGNOSIS — I10 Essential (primary) hypertension: Secondary | ICD-10-CM | POA: Diagnosis not present

## 2020-10-24 DIAGNOSIS — H9190 Unspecified hearing loss, unspecified ear: Secondary | ICD-10-CM | POA: Diagnosis not present

## 2020-10-24 DIAGNOSIS — M1991 Primary osteoarthritis, unspecified site: Secondary | ICD-10-CM | POA: Diagnosis not present

## 2020-10-24 DIAGNOSIS — E785 Hyperlipidemia, unspecified: Secondary | ICD-10-CM | POA: Diagnosis not present

## 2020-10-24 DIAGNOSIS — M792 Neuralgia and neuritis, unspecified: Secondary | ICD-10-CM | POA: Diagnosis not present

## 2020-10-24 DIAGNOSIS — I739 Peripheral vascular disease, unspecified: Secondary | ICD-10-CM | POA: Diagnosis not present

## 2020-10-24 DIAGNOSIS — Z7984 Long term (current) use of oral hypoglycemic drugs: Secondary | ICD-10-CM | POA: Diagnosis not present

## 2020-10-24 DIAGNOSIS — K219 Gastro-esophageal reflux disease without esophagitis: Secondary | ICD-10-CM | POA: Diagnosis not present

## 2020-10-24 DIAGNOSIS — E039 Hypothyroidism, unspecified: Secondary | ICD-10-CM | POA: Diagnosis not present

## 2020-10-24 DIAGNOSIS — I2510 Atherosclerotic heart disease of native coronary artery without angina pectoris: Secondary | ICD-10-CM | POA: Diagnosis not present

## 2020-10-24 DIAGNOSIS — D649 Anemia, unspecified: Secondary | ICD-10-CM | POA: Diagnosis not present

## 2020-10-24 DIAGNOSIS — Z7982 Long term (current) use of aspirin: Secondary | ICD-10-CM | POA: Diagnosis not present

## 2020-10-24 DIAGNOSIS — M797 Fibromyalgia: Secondary | ICD-10-CM | POA: Diagnosis not present

## 2020-10-25 ENCOUNTER — Ambulatory Visit (INDEPENDENT_AMBULATORY_CARE_PROVIDER_SITE_OTHER): Payer: PPO | Admitting: Nurse Practitioner

## 2020-10-25 ENCOUNTER — Other Ambulatory Visit: Payer: Self-pay | Admitting: Nurse Practitioner

## 2020-10-25 ENCOUNTER — Other Ambulatory Visit: Payer: Self-pay

## 2020-10-25 ENCOUNTER — Other Ambulatory Visit: Payer: Self-pay | Admitting: Family Medicine

## 2020-10-25 ENCOUNTER — Encounter: Payer: Self-pay | Admitting: Nurse Practitioner

## 2020-10-25 VITALS — BP 118/62 | HR 81 | Temp 96.9°F | Ht 62.0 in | Wt 167.0 lb

## 2020-10-25 DIAGNOSIS — R54 Age-related physical debility: Secondary | ICD-10-CM

## 2020-10-25 DIAGNOSIS — Z79899 Other long term (current) drug therapy: Secondary | ICD-10-CM | POA: Diagnosis not present

## 2020-10-25 DIAGNOSIS — R5381 Other malaise: Secondary | ICD-10-CM | POA: Diagnosis not present

## 2020-10-25 DIAGNOSIS — E1142 Type 2 diabetes mellitus with diabetic polyneuropathy: Secondary | ICD-10-CM

## 2020-10-25 DIAGNOSIS — M797 Fibromyalgia: Secondary | ICD-10-CM | POA: Diagnosis not present

## 2020-10-25 DIAGNOSIS — R296 Repeated falls: Secondary | ICD-10-CM | POA: Diagnosis not present

## 2020-10-25 DIAGNOSIS — F40298 Other specified phobia: Secondary | ICD-10-CM

## 2020-10-25 DIAGNOSIS — I10 Essential (primary) hypertension: Secondary | ICD-10-CM | POA: Diagnosis not present

## 2020-10-25 DIAGNOSIS — E782 Mixed hyperlipidemia: Secondary | ICD-10-CM

## 2020-10-25 NOTE — Progress Notes (Signed)
Subjective:  Patient ID: Cindy Gordon, female    DOB: [birth year]  Age: 79 y.o. MRN: [id number]  Chief Complaint  Patient presents with  . Follow up from Discharge from [facility] is an 79 year old Caucasian female that presents for evaluation after discharge from [facility] s/p fall with hospitalization. She received physical therapy for balance and gait training for fall prevention. She lives alone.She tells me she fell at home yesterday at home, tripped on robe belt. She obtained an abrasion to her nose and bruise to right knee. She was able to rise from fall without difficulty or assistance. She did not seek medical attention after the fall. She states she has a medical alert system but has misplaced the charger to recharge the device. She tells me she has a shower chair but is reluctant to bathe without a friend or family member being present in the home due to fear of falling.  She has home health nurses assisting with medication management and has home PT scheduled. She as moderated difficulty grocery shopping and meal preparation due to debility. States it is difficult to cook for healthy for one person with diabetes and hypertension. She has agreed to Meals on Wheels referral.  Past medical history includes Type 2 DM, HTN, OSA, GERD, Hyperlipidemia, Fibromyalgia, and overactive bladder. Patient has not monitored blood sugar or hypertension at home recently.  Current Outpatient Medications on File Prior to Visit  Medication Sig Dispense Refill  . acetaminophen (TYLENOL) 325 MG tablet Take 650 mg by mouth every 4 (four) hours as needed.    Marland Kitchen amLODipine (NORVASC) 2.5 MG tablet TAKE ONE TABLET BY MOUTH EVERY DAY 90 tablet 1  . aspirin EC 81 MG tablet Take 81 mg by mouth daily. Swallow whole.    . Biotin 1000 MCG tablet Take 1,000 mcg by mouth daily.    . Blood Glucose Monitoring Suppl (ONE TOUCH ULTRA MINI) w/Device KIT Use daily to check blood  sugar levels. E11.42 1 kit 0  . calcium-vitamin D (OSCAL WITH D) 500-200 MG-UNIT tablet Take 2 tablets by mouth daily. For osteoporosis.    Marland Kitchen celecoxib (CELEBREX) 200 MG capsule TAKE ONE CAPSULE BY MOUTH EVERY DAY 90 capsule 1  . diclofenac Sodium (VOLTAREN) 1 % GEL Apply 1 application topically 2 (two) times daily.    Marland Kitchen dicyclomine (BENTYL) 10 MG capsule Take 10 mg by mouth 4 (four) times daily -  before meals and at bedtime.    . furosemide (LASIX) 40 MG tablet Take 40 mg by mouth daily as needed for edema.    Marland Kitchen glucose blood (ONETOUCH ULTRA) test strip CHECK BLOOD SUGAR EVERY DAY 100 each 3  . hydrocortisone (PROCTOZONE-HC) 2.5 % rectal cream Place 1 application rectally every 6 (six) hours as needed for hemorrhoids or anal itching.    . levothyroxine (SYNTHROID) 50 MCG tablet TAKE ONE TABLET BY MOUTH EVERY MORNING 30 MIN-1 HOUR BEFORE EATING WITH WATER 90 tablet 0  . losartan (COZAAR) 50 MG tablet Take 50 mg by mouth daily.    . meclizine (ANTIVERT) 25 MG tablet Take 1 tablet by mouth 3 (three) times daily as needed for dizziness.    . Milnacipran HCl (SAVELLA) 100 MG TABS tablet Take 1 tablet (100 mg total) by mouth 2 (two) times daily. 180 tablet 3  . Multiple Vitamin (MULTIVITAMIN) tablet Take 1 tablet by mouth daily.    . Omega-3 Fatty Acids (FISH OIL PO) Take 1  capsule by mouth daily. For cholesterol    . pantoprazole (PROTONIX) 40 MG tablet Take 40 mg by mouth daily.    . polyethylene glycol (MIRALAX / GLYCOLAX) 17 g packet Take 17 g by mouth daily. 14 each   . pregabalin (LYRICA) 300 MG capsule TAKE ONE CAPSULE BY MOUTH 2 TIMES A DAY 180 capsule 1  . rosuvastatin (CRESTOR) 5 MG tablet Take 5 mg by mouth daily. For hyperlipidemia     . solifenacin (VESICARE) 10 MG tablet Take 1 tablet (10 mg total) by mouth daily. 90 tablet 0  . traMADol (ULTRAM) 50 MG tablet Take 50 mg by mouth every 12 (twelve) hours as needed.    . vitamin B-12 (CYANOCOBALAMIN) 1000 MCG tablet Take 1,000 mcg by mouth  daily.    . metFORMIN (GLUCOPHAGE) 500 MG tablet Take 1 tablet (500 mg total) by mouth daily with breakfast. 90 tablet 2   No current facility-administered medications on file prior to visit.   Past Medical History:  Diagnosis Date  . Atrophy of thyroid (acquired)   . Carpal tunnel syndrome   . Chronic back pain   . Diabetes mellitus without complication ([place])   . Fibromyalgia   . Gastro-esophageal reflux disease without esophagitis   . Hyperlipidemia   . Hypertension   . Irritable bowel syndrome   . Localized edema   . Localized swelling, mass and lump, neck   . Neuromuscular dysfunction of bladder, unspecified   . Obstructive sleep apnea (adult) (pediatric)   . Other obesity   . Other specified hypothyroidism   . Sciatica, right side   . Urge incontinence   . Vitamin B 12 deficiency   . Vitamin D deficiency    Past Surgical History:  Procedure Laterality Date  . APPENDECTOMY    . BLADDER SUSPENSION    . BREAST BIOPSY     right breast: benign  . carpel tunnel repair Left 08/2014  . CATARACT EXTRACTION  2019   right   . CHOLECYSTECTOMY OPEN  1980   acute panceatitis:requiring removal of stones from pancreatitic duct  . dobutamine nuclear stress test     12/2010  . HYSTERECTOMY ABDOMINAL WITH SALPINGO-OOPHORECTOMY    . LUMBAR LAMINECTOMY    . RECTOCELE REPAIR  02/2011  . REVERSE TOTAL SHOULDER ARTHROPLASTY  07/2017  . TOTAL SHOULDER REPLACEMENT  2002   secondary to staph infection  . VEIN LIGATION AND STRIPPING      Family History  Problem Relation Age of Onset  . Alzheimer's disease Mother   . CVA Mother   . Alzheimer's disease Sister   . Diabetes Sister   . Hypertension Sister   . Hypothyroidism Sister   . Congenital heart disease Sister   . Prostate cancer Son   . Coronary artery disease Daughter   . Peptic Ulcer Other   . Diabetes type II Other   . Cystic fibrosis Grandson    Social History   Socioeconomic History  . Marital status: Widowed     Spouse name: Not on file  . Number of children: 3  . Years of education: Not on file  . Highest education level: Not on file  Occupational History  . Not on file  Tobacco Use  . Smoking status: Never Smoker  . Smokeless tobacco: Never Used  Vaping Use  . Vaping Use: Never used  Substance and Sexual Activity  . Alcohol use: Never  . Drug use: Never  . Sexual activity: Not on file  Other Topics  Concern  . Not on file  Social History Narrative  . Not on file   Social Determinants of Health   Financial Resource Strain: Not on file  Food Insecurity: Not on file  Transportation Needs: Not on file  Physical Activity: Not on file  Stress: Not on file  Social Connections: Not on file    Review of Systems  Constitutional: Positive for fatigue. Negative for appetite change and fever.  HENT: Negative for congestion, ear pain, sinus pressure and sore throat.   Eyes: Negative for pain.  Respiratory: Negative for cough, chest tightness, shortness of breath and wheezing.   Cardiovascular: Negative for chest pain and palpitations.  Gastrointestinal: Positive for constipation. Negative for abdominal pain, diarrhea, nausea and vomiting.  Genitourinary: Negative for dysuria and hematuria.  Musculoskeletal: Positive for arthralgias, joint swelling and myalgias. Negative for back pain.  Skin: Positive for wound (abrasion nose, bruise to right knee). Negative for rash.  Neurological: Positive for weakness. Negative for dizziness and headaches.       Frequent falls  Psychiatric/Behavioral: Negative for dysphoric mood and sleep disturbance. The patient is not nervous/anxious.      Objective:  BP 118/62 (BP Location: Left Arm, Patient Position: Sitting)   Pulse 81   Temp (!) 96.9 F (36.1 C) (Temporal)   Ht '5\' 2"'  (1.575 m)   Wt 167 lb (75.8 kg)   SpO2 99%   BMI 30.54 kg/m   BP/Weight 09/17/2020 07/23/2020 1/57/2620  Systolic BP 355 - 974  Diastolic BP 64 - 62  Wt. (Lbs) 170.4 176 171   BMI 31.17 32.19 31.28    Physical Exam Vitals reviewed.  Constitutional:      Appearance: Normal appearance.  HENT:     Head: Normocephalic.     Right Ear: Tympanic membrane, ear canal and external ear normal.     Left Ear: Tympanic membrane, ear canal and external ear normal.     Nose:      Mouth/Throat:     Mouth: Mucous membranes are moist.  Cardiovascular:     Rate and Rhythm: Normal rate and regular rhythm.     Pulses: Normal pulses.     Heart sounds: Normal heart sounds.  Pulmonary:     Effort: Pulmonary effort is normal.     Breath sounds: Normal breath sounds.  Abdominal:     General: Bowel sounds are normal.     Palpations: Abdomen is soft.  Musculoskeletal:     Cervical back: Normal range of motion and neck supple.       Legs:  Skin:    General: Skin is warm and dry.     Capillary Refill: Capillary refill takes less than 2 seconds.  Neurological:     General: No focal deficit present.     Mental Status: She is alert and oriented to person, place, and time.  Psychiatric:        Mood and Affect: Mood normal.        Behavior: Behavior normal.        Thought Content: Thought content normal.        Judgment: Judgment normal.          Lab Results  Component Value Date   WBC 6.7 09/17/2020   HGB 12.7 09/17/2020   HCT 38.9 09/17/2020   PLT 313 09/17/2020   GLUCOSE 120 (H) 09/17/2020   CHOL 125 09/17/2020   TRIG 108 09/17/2020   HDL 36 (L) 09/17/2020   LDLCALC 69 09/17/2020  ALT 21 09/17/2020   AST 17 09/17/2020   NA 138 09/17/2020   K 4.9 09/17/2020   CL 100 09/17/2020   CREATININE 0.94 09/17/2020   BUN 18 09/17/2020   CO2 26 09/17/2020   TSH 1.930 03/13/2020   HGBA1C 6.7 (H) 09/17/2020   MICROALBUR 80 09/17/2020      Assessment & Plan:   1. Frequent falls - Vitamin D, 25-hydroxy -Change positions slowly -Fall precautions education provided  2. Polypharmacy - AMB Referral to [facility] Coordination: Pharmacy for prescription  drug management  3. Fear of falling  -Continue PT home health -Change positions slowly  4. Age-related physical debility - AMB Referral to [facility] - Continue PT home health  5. Controlled type 2 diabetes mellitus with diabetic polyneuropathy, without long-term current use of insulin ([place]) -referral for Meals on Wheels for [place] Metformin 500 mg daily  6. Fibromyalgia -Meals on Wheels referral [facility]  7. Essential hypertension, benign -Continue Medications -Heart healthy diet -Monitor BP at home, keep log -Change positions slowly   8. Physical deconditioning -Continue home health PT -Increase activity as tolerated  Orders Placed This Encounter  Procedures  . Vitamin D, 25-hydroxy     Referral made for medication management to pharmacist Lysle Rubens, RPH-she will call you Meals on Wheels Referral from [facility] Fall precautions Call for any problems Continue medications Use shower chair for bathing, recommend spray shower nozzle Contact medical alert company for new charger      Follow-up: Fasting appointment with Dr Tobie Poet 12/25/2019  An After Visit Summary was printed and given to the patient.  Rip Harbour, NP [place] [phone]

## 2020-10-25 NOTE — Patient Instructions (Addendum)
Referral made for medication management to pharmacist Lysle Rubens, RPH-she will call you. Fall precautions Call for any problems Continue medications Use shower chair for bathing, recommend spray shower nozzle Contact medical alert company for new [facility] Medicine Management Taking your medicines corhrectly is an important part of managing or preventing medical problems. Make sure you know what disease or condition your medicine is treating, and how and when to take it. If you do not take your medicine correctly, it may not work well and may cause unpleasant side effects, including serious health problems. What should I do when I am taking medicines?   Read all the labels and inserts that come with your medicines. Review the information often.  Talk with your pharmacist if you get a refill and notice a change in the size, color, or shape of your medicines.  Know the potential side effects for each medicine that you take.  Try to get all your medicines from the same pharmacy. The pharmacist will have all your information and will understand how your medicines will affect each other (interact).  Tell your health care provider about all your medicines, including over-the-counter medicines, vitamins, and herbal or dietary supplements. He or she will make sure that nothing will interact with any of your prescribed medicines. How can I take my medicines safely?  Take medicines only as told by your health care provider. ? Do not take more of your medicine than instructed. ? Do not take anyone else's medicines. ? Do not share your medicines with others. ? Do not stop taking your medicines unless your health care provider tells you to do so. ? You may need to avoid alcohol or certain foods or liquids when taking certain medicines. Follow your health care provider's instructions.  Do not split, mash, or chew your medicines unless your health care provider tells you to do so.  Tell your health care provider if you have trouble swallowing your medicines.  For liquid medicine, use the dosing container that was provided. How should I organize my medicines?  Know your medicines  Know what each of your medicines looks like. This includes size, color, and shape. Tell your health care provider if you are having trouble recognizing all the medicines that you are taking.  If you cannot tell your medicines apart because they look similar, keep them in original bottles.  If you cannot read the labels on the bottles, tell your pharmacist to put your medicines in containers with large print.  Review your medicines and your schedule with family members, a friend, or a caregiver. Use a pill organizer  Use a tool to organize your medicine schedule. Tools include a weekly pillbox, a written chart, a notebook, or a calendar.  Your tool should help you remember the following things about each medicine: ? The name of the medicine. ? The amount (dose) to take. ? The schedule. This is the day and time the medicine should be taken. ? The appearance. This includes color, shape, size, and stamp. ? How to take your medicines. This includes instructions to take them with food, without food, with fluids, or with other medicines.  Create reminders for taking your medicines. Use sticky notes, or alarms on your watch, mobile device, or phone calendar.  You may choose to use a more advanced management system. These systems have storage, alarms, and visual and audio prompts.  Some medicines can be taken on an "as-needed" basis. These include medicines for  nausea or pain. If you take an as-needed medicine, write down the name and dose, as well as the date and time that you took it. How should I plan for travel?  Take your pillbox, medicines, and organization system with you when traveling.  Have your medicines refilled before you travel. This will ensure that you do not run out of your  medicines while you are away from home.  Always carry an updated list of your medicines with you. If there is an emergency, a first responder can quickly see what medicines you are taking.  Do not pack your medicines in checked luggage in case your luggage is lost or delayed.  If any of your medicines is considered a controlled substance, make sure you bring a letter from your health care provider with you. How should I store and discard my medicines? For safe storage:  Store medicines in a cool, dry area away from light, or as directed by your health care provider. Do not store medicines in the bathroom. Heat and humidity will affect them.  Do not store your medicines with other chemicals, or with medicines for pets or other household members.  Keep medicines away from children and pets. Do not leave them on counters or bedside tables. Store them in high cabinets or on high shelves. For safe disposal:  Check expiration dates regularly. Do not take expired medicines. Discard medicines that are older than the expiration date.  Learn a safe way to dispose of your medicines. You may: ? Use a local government, hospital, or pharmacy medicine-take-back program. ? Mix the medicines with inedible substances, put them in a sealed bag or empty container, and throw them in the trash. What should I remember?  Tell your health care provider if you: ? Experience side effects. ? Have new symptoms. ? Have other concerns about taking your medicines.  Review your medicines regularly with your health care provider. Other medicines, diet, medical conditions, weight changes, and daily habits can all affect how medicines work. Ask if you need to continue taking each medicine, and discuss how well each one is working.  Refill your medicines early to avoid running out of them.  In case of an accidental overdose, call your local [place] at [phone] or visit your local emergency department  immediately. This is important. Summary  Taking your medicines correctly is an important part of managing or preventing medical problems.  You need to make sure that you understand what you are taking a medicine for, as well as how and when you need to take it.  Know your medicines and use a pill organizer to help you take your medicines correctly.  In case of an accidental overdose, call your local [place] at [phone] or visit your local emergency department immediately. This is important. This information is not intended to replace advice given to you by your health care provider. Make sure you discuss any questions you have with your health care provider. Document Revised: 10/22/2017 Document Reviewed: 10/22/2017 Elsevier Patient Education  2020 [place] and Fractures  Falls can be very serious, especially for older adults or people with osteoporosis  Falls can be caused by:  Tripping or slipping  Slow reflexes  Balance problems  Reduced muscle strength  Poor vision or a recent change in prescription  Illness and some medications (especially blood pressure pills, diuretics, heart medicines, muscle relaxants and sleep medications)  Drinking alcohol  To prevent falls outdoors:  Use a can or walker if needed  Wear rubber-soled shoes so you don't slip  DO NOT buy "shape up" shoes with rocker bottom soles if you have balance problems.  The thick soles and shape make it more difficult to keep your balance.  Put kitty litter or salt on icy sidewalks  Walk on the grass if the sidewalks are slick  Avoid walking on uneven ground whenever possible  T prevent falls indoors:  Keep rooms clutter-free, especially hallways, stairs and paths to light switches  Remove throw rugs  Install night lights, especially to and in the bathroom  Turn on lights before going downstairs  Keep a flashlight next to your bed  Buy a cordless  phone to keep with you instead of jumping up to answer the phone  Install grab bars in the bathroom near the shower and toilet  Install rails on both sides of the stairs.  Make sure the stairs are well lit  Wear slippers with non-skid soles.  Do not walk around in stockings or socks  Balance problems and dizziness are not a normal part of growing older.  If you begin having balance problems or dizziness see your doctor.  Physical Therapy can help you with many balance problems, strengthening hip and leg muscles and with gait training.  To keep your bones healthy make sure you are getting enough calcium and Vitamin D each day.  Ask your doctor or pharmacist about supplements.  Regular weight-bearing exercise like walking, lifting weights or dancing can help strengthen bones and prevent osteoporosis. Fall Prevention in the Home, Adult Falls can cause injuries. They can happen to people of all ages. There are many things you can do to make your home safe and to help prevent falls. Ask for help when making these changes, if needed. What actions can I take to prevent falls? General Instructions  Use good lighting in all rooms. Replace any light bulbs that burn out.  Turn on the lights when you go into a dark area. Use night-lights.  Keep items that you use often in easy-to-reach places. Lower the shelves around your home if necessary.  Set up your furniture so you have a clear path. Avoid moving your furniture around.  Do not have throw rugs and other things on the floor that can make you trip.  Avoid walking on wet floors.  If any of your floors are uneven, fix them.  Add color or contrast paint or tape to clearly mark and help you see: ? Any grab bars or handrails. ? First and last steps of stairways. ? Where the edge of each step is.  If you use a stepladder: ? Make sure that it is fully opened. Do not climb a closed stepladder. ? Make sure that both sides of the stepladder are  locked into place. ? Ask someone to hold the stepladder for you while you use it.  If there are any pets around you, be aware of where they are. What can I do in the bathroom?      Keep the floor dry. Clean up any water that spills onto the floor as soon as it happens.  Remove soap buildup in the tub or shower regularly.  Use non-skid mats or decals on the floor of the tub or shower.  Attach bath mats securely with double-sided, non-slip rug tape.  If you need to sit down in the shower, use a plastic, non-slip stool.  Install grab bars by the toilet and  in the tub and shower. Do not use towel bars as grab bars. What can I do in the bedroom?  Make sure that you have a light by your bed that is easy to reach.  Do not use any sheets or blankets that are too big for your bed. They should not hang down onto the floor.  Have a firm chair that has side arms. You can use this for support while you get dressed. What can I do in the kitchen?  Clean up any spills right away.  If you need to reach something above you, use a strong step stool that has a grab bar.  Keep electrical cords out of the way.  Do not use floor polish or wax that makes floors slippery. If you must use wax, use non-skid floor wax. What can I do with my stairs?  Do not leave any items on the stairs.  Make sure that you have a light switch at the top of the stairs and the bottom of the stairs. If you do not have them, ask someone to add them for you.  Make sure that there are handrails on both sides of the stairs, and use them. Fix handrails that are broken or loose. Make sure that handrails are as long as the stairways.  Install non-slip stair treads on all stairs in your home.  Avoid having throw rugs at the top or bottom of the stairs. If you do have throw rugs, attach them to the floor with carpet tape.  Choose a carpet that does not hide the edge of the steps on the stairway.  Check any carpeting to  make sure that it is firmly attached to the stairs. Fix any carpet that is loose or worn. What can I do on the outside of my home?  Use bright outdoor lighting.  Regularly fix the edges of walkways and driveways and fix any cracks.  Remove anything that might make you trip as you walk through a door, such as a raised step or threshold.  Trim any bushes or trees on the path to your home.  Regularly check to see if handrails are loose or broken. Make sure that both sides of any steps have handrails.  Install guardrails along the edges of any raised decks and porches.  Clear walking paths of anything that might make someone trip, such as tools or rocks.  Have any leaves, snow, or ice cleared regularly.  Use sand or salt on walking paths during winter.  Clean up any spills in your garage right away. This includes grease or oil spills. What other actions can I take?  Wear shoes that: ? Have a low heel. Do not wear high heels. ? Have rubber bottoms. ? Are comfortable and fit you well. ? Are closed at the toe. Do not wear open-toe sandals.  Use tools that help you move around (mobility aids) if they are needed. These include: ? Canes. ? Walkers. ? Scooters. ? Crutches.  Review your medicines with your doctor. Some medicines can make you feel dizzy. This can increase your chance of falling. Ask your doctor what other things you can do to help prevent falls. Where to find more information  Centers for Disease Control and Prevention, STEADI: https://garcia.biz/  [employer] on Aging: BrainJudge.co.uk Contact a doctor if:  You are afraid of falling at home.  You feel weak, drowsy, or dizzy at home.  You fall at home. Summary  There are many simple things that  you can do to make your home safe and to help prevent falls.  Ways to make your home safe include removing tripping hazards and installing grab bars in the bathroom.  Ask for help when making these  changes in your home. This information is not intended to replace advice given to you by your health care provider. Make sure you discuss any questions you have with your health care provider. Document Revised: 02/17/2019 Document Reviewed: 06/11/2017 Elsevier Patient Education  2020 [employer].

## 2020-10-26 ENCOUNTER — Telehealth: Payer: Self-pay

## 2020-10-26 ENCOUNTER — Encounter: Payer: Self-pay | Admitting: Nurse Practitioner

## 2020-10-26 DIAGNOSIS — E1142 Type 2 diabetes mellitus with diabetic polyneuropathy: Secondary | ICD-10-CM

## 2020-10-26 LAB — VITAMIN D 25 HYDROXY (VIT D DEFICIENCY, FRACTURES): Vit D, 25-Hydroxy: 36.5 ng/mL (ref 30.0–100.0)

## 2020-10-26 NOTE — Telephone Encounter (Signed)
CCM Referral 

## 2020-10-29 ENCOUNTER — Telehealth: Payer: Self-pay | Admitting: Family Medicine

## 2020-10-29 NOTE — Chronic Care Management (AMB) (Signed)
  Chronic Care Management   Note  10/29/2020 Name: Cindy Gordon MRN: [id number] DOB: Mar 02, 1941  Cindy Gordon is a 79 y.o. year old female who is a primary care patient of Cox, Kirsten, MD. I reached out to Rushie Chestnut by phone today in response to a referral sent by Cindy Gordon's PCP, Cox, Kirsten, MD.   Cindy Gordon was given information about Chronic Care Management services today including:  1. CCM service includes personalized support from designated clinical staff supervised by her physician, including individualized plan of care and coordination with other care providers 2. 24/7 contact phone numbers for assistance for urgent and routine care needs. 3. Service will only be billed when office clinical staff spend 20 minutes or more in a month to coordinate care. 4. Only one practitioner may furnish and bill the service in a calendar month. 5. The patient may stop CCM services at any time (effective at the end of the month) by phone call to the office staff.   Patient agreed to services and verbal consent obtained.   Follow up plan:   [place]

## 2020-10-31 ENCOUNTER — Other Ambulatory Visit: Payer: Self-pay | Admitting: Nurse Practitioner

## 2020-10-31 DIAGNOSIS — E1140 Type 2 diabetes mellitus with diabetic neuropathy, unspecified: Secondary | ICD-10-CM

## 2020-10-31 DIAGNOSIS — D649 Anemia, unspecified: Secondary | ICD-10-CM

## 2020-10-31 DIAGNOSIS — E039 Hypothyroidism, unspecified: Secondary | ICD-10-CM

## 2020-10-31 DIAGNOSIS — M792 Neuralgia and neuritis, unspecified: Secondary | ICD-10-CM

## 2020-10-31 MED ORDER — LOSARTAN POTASSIUM 50 MG PO TABS: 50.0000 mg | ORAL_TABLET | Freq: Every day | ORAL | 0 refills | Status: DC

## 2020-11-05 ENCOUNTER — Other Ambulatory Visit: Payer: Self-pay | Admitting: *Deleted

## 2020-11-05 NOTE — Patient Outreach (Signed)
[facility]) Care Management  11/05/2020  Camrynn Mcclintic Jun 26, 1941 [id number]   [facility] Telephone Assessment/Screen referral  Referral Date: 10/1720  Referral Source: MD referral from Dr Rochel Brome Lucila [place], [place]) Referral Reason: CCM for Diabetes Insurance: health advantage    Outreach attempt #1 successful to her home number  Patient is able to verify HIPAA, DOB and address Consent: [facility] RN CM reviewed [facility] services with patient. Patient gave verbal consent for services [facility] telephonic RN CM.  Reviewed and addressed referral to [facility] with patient    Past Medical History:  Diagnosis Date  . Atrophy of thyroid (acquired)   . Carpal tunnel syndrome   . Chronic back pain   . Diabetes mellitus without complication ([place])   . Fibromyalgia   . Gastro-esophageal reflux disease without esophagitis   . Hyperlipidemia   . Hypertension   . Irritable bowel syndrome   . Localized edema   . Localized swelling, mass and lump, neck   . Neuromuscular dysfunction of bladder, unspecified   . Obstructive sleep apnea (adult) (pediatric)   . Other obesity   . Other specified hypothyroidism   . Sciatica, right side   . Urge incontinence   . Vitamin B 12 deficiency   . Vitamin D deficiency     Diabetes Pt not aware of her last HgA1c value Reports she can not recall her provider calling her back to update her [facility] RN CM reviewed Epic to find a listed one as 6.7 on 09/17/20  Pt voices she is unsure of the MD recommend goal for her HgA1c [facility] RN CM discussed the purpose of HgA1c and standard goals of below 6.5 or 7 Mrs Osterloh reports she had a CBG of 117 today and reports generally her CBG is okay if she does not eat sweets. She reports being aware that carbohydrates causes elevations in her CBG. She reports attempts to eat bun/breadless sandwiches. She reports she does some exercise and takes her oral diabetes medications as ordered [facility] RN CM reviewed other things that may  cause elevation in CBG/HgA1c values to include stress, pain, infection, excess weight, etc. Mrs Ausley reports chronic pain from her fibromyalgia, back pain, sciatica pain and being over weight [facility] RN CM discussed assisting with services, resources to help in these areas prn She voiced understanding and appreciation   With assessment pt noted and confirmed that she has Health team advantage (HTA) coverage. Her care management services may be provided by another vendor/RN CM  This was discussed with her and she agreed for follow up related to this    Plan: Patient agrees to follow up from [place] to clarify service provider for care management after review with [facility] CMA prior to initiating care plan  Pt encouraged to return a call to [facility] RN CM prn she was provided with [facility] RN CM office number and the 24 hour nurse line number     Havier Deeb L. Lavina Hamman, RN, BSN, [place] Coordinator Office number [phone] Mobile number [phone]  Main [facility] number [phone] Fax number [phone]

## 2020-11-06 ENCOUNTER — Other Ambulatory Visit: Payer: Self-pay | Admitting: *Deleted

## 2020-11-06 ENCOUNTER — Encounter: Payer: Self-pay | Admitting: *Deleted

## 2020-11-06 NOTE — Patient Outreach (Signed)
[facility]) Care Management  11/06/2020  Cindy Gordon 1940/11/12 [id number]   [facility] telephonic case closure     [facility] RN CM consulted with [facility] CMA Pt is confirmed with another care management service provider (HTA) Pt services to be sent to provider [facility] RN CM outreach to patient unsuccessful at the home number A HIPAA compliant message left to update her that [place] was consulted, confirmed she will be outreached by another care management service HTA staff Pt encouraged to return a call to [facility] RN CM prn and is she does not receive HTA outreach [facility] RN CM office number   Plan [facility] telephonic RN CM case closure Sent case closure letter also informing her of pending outreach from [employer] L. Lavina Hamman, RN, BSN, [place] Coordinator Office number [phone] [facility] number [phone] Fax number [phone]

## 2020-11-12 DIAGNOSIS — M1991 Primary osteoarthritis, unspecified site: Secondary | ICD-10-CM | POA: Diagnosis not present

## 2020-11-12 DIAGNOSIS — N3281 Overactive bladder: Secondary | ICD-10-CM | POA: Diagnosis not present

## 2020-11-12 DIAGNOSIS — E785 Hyperlipidemia, unspecified: Secondary | ICD-10-CM | POA: Diagnosis not present

## 2020-11-12 DIAGNOSIS — R339 Retention of urine, unspecified: Secondary | ICD-10-CM | POA: Diagnosis not present

## 2020-11-12 DIAGNOSIS — E1151 Type 2 diabetes mellitus with diabetic peripheral angiopathy without gangrene: Secondary | ICD-10-CM | POA: Diagnosis not present

## 2020-11-12 DIAGNOSIS — M519 Unspecified thoracic, thoracolumbar and lumbosacral intervertebral disc disorder: Secondary | ICD-10-CM | POA: Diagnosis not present

## 2020-11-12 DIAGNOSIS — H9190 Unspecified hearing loss, unspecified ear: Secondary | ICD-10-CM | POA: Diagnosis not present

## 2020-11-12 DIAGNOSIS — Z7984 Long term (current) use of oral hypoglycemic drugs: Secondary | ICD-10-CM | POA: Diagnosis not present

## 2020-11-12 DIAGNOSIS — I2510 Atherosclerotic heart disease of native coronary artery without angina pectoris: Secondary | ICD-10-CM | POA: Diagnosis not present

## 2020-11-12 DIAGNOSIS — M810 Age-related osteoporosis without current pathological fracture: Secondary | ICD-10-CM | POA: Diagnosis not present

## 2020-11-12 DIAGNOSIS — M792 Neuralgia and neuritis, unspecified: Secondary | ICD-10-CM | POA: Diagnosis not present

## 2020-11-12 DIAGNOSIS — R1314 Dysphagia, pharyngoesophageal phase: Secondary | ICD-10-CM | POA: Diagnosis not present

## 2020-11-12 DIAGNOSIS — D649 Anemia, unspecified: Secondary | ICD-10-CM | POA: Diagnosis not present

## 2020-11-12 DIAGNOSIS — K219 Gastro-esophageal reflux disease without esophagitis: Secondary | ICD-10-CM | POA: Diagnosis not present

## 2020-11-12 DIAGNOSIS — Z87440 Personal history of urinary (tract) infections: Secondary | ICD-10-CM | POA: Diagnosis not present

## 2020-11-12 DIAGNOSIS — K589 Irritable bowel syndrome without diarrhea: Secondary | ICD-10-CM | POA: Diagnosis not present

## 2020-11-12 DIAGNOSIS — K5900 Constipation, unspecified: Secondary | ICD-10-CM | POA: Diagnosis not present

## 2020-11-12 DIAGNOSIS — E1140 Type 2 diabetes mellitus with diabetic neuropathy, unspecified: Secondary | ICD-10-CM | POA: Diagnosis not present

## 2020-11-12 DIAGNOSIS — Z9181 History of falling: Secondary | ICD-10-CM | POA: Diagnosis not present

## 2020-11-12 DIAGNOSIS — M797 Fibromyalgia: Secondary | ICD-10-CM | POA: Diagnosis not present

## 2020-11-12 DIAGNOSIS — E039 Hypothyroidism, unspecified: Secondary | ICD-10-CM | POA: Diagnosis not present

## 2020-11-12 DIAGNOSIS — I10 Essential (primary) hypertension: Secondary | ICD-10-CM | POA: Diagnosis not present

## 2020-11-12 DIAGNOSIS — R3915 Urgency of urination: Secondary | ICD-10-CM | POA: Diagnosis not present

## 2020-11-13 ENCOUNTER — Telehealth: Payer: Self-pay

## 2020-11-13 NOTE — Telephone Encounter (Signed)
[place]ec[place] with [facility] called to report that patient had  A fall yesterday. Also states she did not have injuries.

## 2020-11-14 DIAGNOSIS — M1711 Unilateral primary osteoarthritis, right knee: Secondary | ICD-10-CM | POA: Diagnosis not present

## 2020-11-16 DIAGNOSIS — E1140 Type 2 diabetes mellitus with diabetic neuropathy, unspecified: Secondary | ICD-10-CM | POA: Diagnosis not present

## 2020-11-16 DIAGNOSIS — M792 Neuralgia and neuritis, unspecified: Secondary | ICD-10-CM | POA: Diagnosis not present

## 2020-11-16 DIAGNOSIS — E785 Hyperlipidemia, unspecified: Secondary | ICD-10-CM | POA: Diagnosis not present

## 2020-11-16 DIAGNOSIS — K5900 Constipation, unspecified: Secondary | ICD-10-CM | POA: Diagnosis not present

## 2020-11-16 DIAGNOSIS — Z87440 Personal history of urinary (tract) infections: Secondary | ICD-10-CM | POA: Diagnosis not present

## 2020-11-16 DIAGNOSIS — Z9181 History of falling: Secondary | ICD-10-CM | POA: Diagnosis not present

## 2020-11-16 DIAGNOSIS — R1314 Dysphagia, pharyngoesophageal phase: Secondary | ICD-10-CM | POA: Diagnosis not present

## 2020-11-16 DIAGNOSIS — K589 Irritable bowel syndrome without diarrhea: Secondary | ICD-10-CM | POA: Diagnosis not present

## 2020-11-16 DIAGNOSIS — K219 Gastro-esophageal reflux disease without esophagitis: Secondary | ICD-10-CM | POA: Diagnosis not present

## 2020-11-16 DIAGNOSIS — E039 Hypothyroidism, unspecified: Secondary | ICD-10-CM | POA: Diagnosis not present

## 2020-11-16 DIAGNOSIS — I2510 Atherosclerotic heart disease of native coronary artery without angina pectoris: Secondary | ICD-10-CM | POA: Diagnosis not present

## 2020-11-16 DIAGNOSIS — N3281 Overactive bladder: Secondary | ICD-10-CM | POA: Diagnosis not present

## 2020-11-16 DIAGNOSIS — M519 Unspecified thoracic, thoracolumbar and lumbosacral intervertebral disc disorder: Secondary | ICD-10-CM | POA: Diagnosis not present

## 2020-11-16 DIAGNOSIS — R3915 Urgency of urination: Secondary | ICD-10-CM | POA: Diagnosis not present

## 2020-11-16 DIAGNOSIS — R339 Retention of urine, unspecified: Secondary | ICD-10-CM | POA: Diagnosis not present

## 2020-11-16 DIAGNOSIS — E1151 Type 2 diabetes mellitus with diabetic peripheral angiopathy without gangrene: Secondary | ICD-10-CM | POA: Diagnosis not present

## 2020-11-16 DIAGNOSIS — Z7984 Long term (current) use of oral hypoglycemic drugs: Secondary | ICD-10-CM | POA: Diagnosis not present

## 2020-11-16 DIAGNOSIS — M810 Age-related osteoporosis without current pathological fracture: Secondary | ICD-10-CM | POA: Diagnosis not present

## 2020-11-16 DIAGNOSIS — H9190 Unspecified hearing loss, unspecified ear: Secondary | ICD-10-CM | POA: Diagnosis not present

## 2020-11-16 DIAGNOSIS — M1991 Primary osteoarthritis, unspecified site: Secondary | ICD-10-CM | POA: Diagnosis not present

## 2020-11-16 DIAGNOSIS — M797 Fibromyalgia: Secondary | ICD-10-CM | POA: Diagnosis not present

## 2020-11-16 DIAGNOSIS — D649 Anemia, unspecified: Secondary | ICD-10-CM | POA: Diagnosis not present

## 2020-11-16 DIAGNOSIS — I10 Essential (primary) hypertension: Secondary | ICD-10-CM | POA: Diagnosis not present

## 2020-11-19 DIAGNOSIS — M792 Neuralgia and neuritis, unspecified: Secondary | ICD-10-CM | POA: Diagnosis not present

## 2020-11-19 DIAGNOSIS — Z87440 Personal history of urinary (tract) infections: Secondary | ICD-10-CM | POA: Diagnosis not present

## 2020-11-19 DIAGNOSIS — I2510 Atherosclerotic heart disease of native coronary artery without angina pectoris: Secondary | ICD-10-CM | POA: Diagnosis not present

## 2020-11-19 DIAGNOSIS — E1140 Type 2 diabetes mellitus with diabetic neuropathy, unspecified: Secondary | ICD-10-CM | POA: Diagnosis not present

## 2020-11-19 DIAGNOSIS — K589 Irritable bowel syndrome without diarrhea: Secondary | ICD-10-CM | POA: Diagnosis not present

## 2020-11-19 DIAGNOSIS — M1991 Primary osteoarthritis, unspecified site: Secondary | ICD-10-CM | POA: Diagnosis not present

## 2020-11-19 DIAGNOSIS — E039 Hypothyroidism, unspecified: Secondary | ICD-10-CM | POA: Diagnosis not present

## 2020-11-19 DIAGNOSIS — R339 Retention of urine, unspecified: Secondary | ICD-10-CM | POA: Diagnosis not present

## 2020-11-19 DIAGNOSIS — M810 Age-related osteoporosis without current pathological fracture: Secondary | ICD-10-CM | POA: Diagnosis not present

## 2020-11-19 DIAGNOSIS — M797 Fibromyalgia: Secondary | ICD-10-CM | POA: Diagnosis not present

## 2020-11-19 DIAGNOSIS — Z7984 Long term (current) use of oral hypoglycemic drugs: Secondary | ICD-10-CM | POA: Diagnosis not present

## 2020-11-19 DIAGNOSIS — R3915 Urgency of urination: Secondary | ICD-10-CM | POA: Diagnosis not present

## 2020-11-19 DIAGNOSIS — R1314 Dysphagia, pharyngoesophageal phase: Secondary | ICD-10-CM | POA: Diagnosis not present

## 2020-11-19 DIAGNOSIS — M519 Unspecified thoracic, thoracolumbar and lumbosacral intervertebral disc disorder: Secondary | ICD-10-CM | POA: Diagnosis not present

## 2020-11-19 DIAGNOSIS — K5900 Constipation, unspecified: Secondary | ICD-10-CM | POA: Diagnosis not present

## 2020-11-19 DIAGNOSIS — K219 Gastro-esophageal reflux disease without esophagitis: Secondary | ICD-10-CM | POA: Diagnosis not present

## 2020-11-19 DIAGNOSIS — N3281 Overactive bladder: Secondary | ICD-10-CM | POA: Diagnosis not present

## 2020-11-19 DIAGNOSIS — E1151 Type 2 diabetes mellitus with diabetic peripheral angiopathy without gangrene: Secondary | ICD-10-CM | POA: Diagnosis not present

## 2020-11-19 DIAGNOSIS — I10 Essential (primary) hypertension: Secondary | ICD-10-CM | POA: Diagnosis not present

## 2020-11-19 DIAGNOSIS — Z9181 History of falling: Secondary | ICD-10-CM | POA: Diagnosis not present

## 2020-11-19 DIAGNOSIS — D649 Anemia, unspecified: Secondary | ICD-10-CM | POA: Diagnosis not present

## 2020-11-19 DIAGNOSIS — H9190 Unspecified hearing loss, unspecified ear: Secondary | ICD-10-CM | POA: Diagnosis not present

## 2020-11-19 DIAGNOSIS — E785 Hyperlipidemia, unspecified: Secondary | ICD-10-CM | POA: Diagnosis not present

## 2020-11-21 ENCOUNTER — Other Ambulatory Visit: Payer: Self-pay

## 2020-11-21 DIAGNOSIS — E1140 Type 2 diabetes mellitus with diabetic neuropathy, unspecified: Secondary | ICD-10-CM | POA: Diagnosis not present

## 2020-11-21 DIAGNOSIS — Z87440 Personal history of urinary (tract) infections: Secondary | ICD-10-CM | POA: Diagnosis not present

## 2020-11-21 DIAGNOSIS — N3281 Overactive bladder: Secondary | ICD-10-CM | POA: Diagnosis not present

## 2020-11-21 DIAGNOSIS — R339 Retention of urine, unspecified: Secondary | ICD-10-CM | POA: Diagnosis not present

## 2020-11-21 DIAGNOSIS — M792 Neuralgia and neuritis, unspecified: Secondary | ICD-10-CM | POA: Diagnosis not present

## 2020-11-21 DIAGNOSIS — E1151 Type 2 diabetes mellitus with diabetic peripheral angiopathy without gangrene: Secondary | ICD-10-CM | POA: Diagnosis not present

## 2020-11-21 DIAGNOSIS — H9190 Unspecified hearing loss, unspecified ear: Secondary | ICD-10-CM | POA: Diagnosis not present

## 2020-11-21 DIAGNOSIS — M797 Fibromyalgia: Secondary | ICD-10-CM | POA: Diagnosis not present

## 2020-11-21 DIAGNOSIS — I10 Essential (primary) hypertension: Secondary | ICD-10-CM | POA: Diagnosis not present

## 2020-11-21 DIAGNOSIS — K5900 Constipation, unspecified: Secondary | ICD-10-CM | POA: Diagnosis not present

## 2020-11-21 DIAGNOSIS — M1991 Primary osteoarthritis, unspecified site: Secondary | ICD-10-CM | POA: Diagnosis not present

## 2020-11-21 DIAGNOSIS — E039 Hypothyroidism, unspecified: Secondary | ICD-10-CM | POA: Diagnosis not present

## 2020-11-21 DIAGNOSIS — M519 Unspecified thoracic, thoracolumbar and lumbosacral intervertebral disc disorder: Secondary | ICD-10-CM | POA: Diagnosis not present

## 2020-11-21 DIAGNOSIS — D649 Anemia, unspecified: Secondary | ICD-10-CM | POA: Diagnosis not present

## 2020-11-21 DIAGNOSIS — K219 Gastro-esophageal reflux disease without esophagitis: Secondary | ICD-10-CM | POA: Diagnosis not present

## 2020-11-21 DIAGNOSIS — Z9181 History of falling: Secondary | ICD-10-CM | POA: Diagnosis not present

## 2020-11-21 DIAGNOSIS — Z7984 Long term (current) use of oral hypoglycemic drugs: Secondary | ICD-10-CM | POA: Diagnosis not present

## 2020-11-21 DIAGNOSIS — E785 Hyperlipidemia, unspecified: Secondary | ICD-10-CM | POA: Diagnosis not present

## 2020-11-21 DIAGNOSIS — M810 Age-related osteoporosis without current pathological fracture: Secondary | ICD-10-CM | POA: Diagnosis not present

## 2020-11-21 DIAGNOSIS — R1314 Dysphagia, pharyngoesophageal phase: Secondary | ICD-10-CM | POA: Diagnosis not present

## 2020-11-21 DIAGNOSIS — K589 Irritable bowel syndrome without diarrhea: Secondary | ICD-10-CM | POA: Diagnosis not present

## 2020-11-21 DIAGNOSIS — I2510 Atherosclerotic heart disease of native coronary artery without angina pectoris: Secondary | ICD-10-CM | POA: Diagnosis not present

## 2020-11-21 DIAGNOSIS — R3915 Urgency of urination: Secondary | ICD-10-CM | POA: Diagnosis not present

## 2020-11-21 MED ORDER — ONETOUCH ULTRA VI STRP: ORAL_STRIP | 3 refills | Status: DC

## 2020-11-22 DIAGNOSIS — K219 Gastro-esophageal reflux disease without esophagitis: Secondary | ICD-10-CM | POA: Diagnosis not present

## 2020-11-22 DIAGNOSIS — I2510 Atherosclerotic heart disease of native coronary artery without angina pectoris: Secondary | ICD-10-CM | POA: Diagnosis not present

## 2020-11-22 DIAGNOSIS — N3281 Overactive bladder: Secondary | ICD-10-CM | POA: Diagnosis not present

## 2020-11-22 DIAGNOSIS — I10 Essential (primary) hypertension: Secondary | ICD-10-CM | POA: Diagnosis not present

## 2020-11-22 DIAGNOSIS — K589 Irritable bowel syndrome without diarrhea: Secondary | ICD-10-CM | POA: Diagnosis not present

## 2020-11-22 DIAGNOSIS — M1991 Primary osteoarthritis, unspecified site: Secondary | ICD-10-CM | POA: Diagnosis not present

## 2020-11-22 DIAGNOSIS — E039 Hypothyroidism, unspecified: Secondary | ICD-10-CM | POA: Diagnosis not present

## 2020-11-22 DIAGNOSIS — Z9181 History of falling: Secondary | ICD-10-CM | POA: Diagnosis not present

## 2020-11-22 DIAGNOSIS — M792 Neuralgia and neuritis, unspecified: Secondary | ICD-10-CM | POA: Diagnosis not present

## 2020-11-22 DIAGNOSIS — K5900 Constipation, unspecified: Secondary | ICD-10-CM | POA: Diagnosis not present

## 2020-11-22 DIAGNOSIS — Z7984 Long term (current) use of oral hypoglycemic drugs: Secondary | ICD-10-CM | POA: Diagnosis not present

## 2020-11-22 DIAGNOSIS — D649 Anemia, unspecified: Secondary | ICD-10-CM | POA: Diagnosis not present

## 2020-11-22 DIAGNOSIS — R3915 Urgency of urination: Secondary | ICD-10-CM | POA: Diagnosis not present

## 2020-11-22 DIAGNOSIS — M519 Unspecified thoracic, thoracolumbar and lumbosacral intervertebral disc disorder: Secondary | ICD-10-CM | POA: Diagnosis not present

## 2020-11-22 DIAGNOSIS — E785 Hyperlipidemia, unspecified: Secondary | ICD-10-CM | POA: Diagnosis not present

## 2020-11-22 DIAGNOSIS — R339 Retention of urine, unspecified: Secondary | ICD-10-CM | POA: Diagnosis not present

## 2020-11-22 DIAGNOSIS — E1140 Type 2 diabetes mellitus with diabetic neuropathy, unspecified: Secondary | ICD-10-CM | POA: Diagnosis not present

## 2020-11-22 DIAGNOSIS — E1151 Type 2 diabetes mellitus with diabetic peripheral angiopathy without gangrene: Secondary | ICD-10-CM | POA: Diagnosis not present

## 2020-11-22 DIAGNOSIS — H9190 Unspecified hearing loss, unspecified ear: Secondary | ICD-10-CM | POA: Diagnosis not present

## 2020-11-22 DIAGNOSIS — M810 Age-related osteoporosis without current pathological fracture: Secondary | ICD-10-CM | POA: Diagnosis not present

## 2020-11-22 DIAGNOSIS — R1314 Dysphagia, pharyngoesophageal phase: Secondary | ICD-10-CM | POA: Diagnosis not present

## 2020-11-22 DIAGNOSIS — Z87440 Personal history of urinary (tract) infections: Secondary | ICD-10-CM | POA: Diagnosis not present

## 2020-11-22 DIAGNOSIS — M797 Fibromyalgia: Secondary | ICD-10-CM | POA: Diagnosis not present

## 2020-11-27 DIAGNOSIS — M1991 Primary osteoarthritis, unspecified site: Secondary | ICD-10-CM | POA: Diagnosis not present

## 2020-11-27 DIAGNOSIS — M519 Unspecified thoracic, thoracolumbar and lumbosacral intervertebral disc disorder: Secondary | ICD-10-CM | POA: Diagnosis not present

## 2020-11-27 DIAGNOSIS — R339 Retention of urine, unspecified: Secondary | ICD-10-CM | POA: Diagnosis not present

## 2020-11-27 DIAGNOSIS — I2510 Atherosclerotic heart disease of native coronary artery without angina pectoris: Secondary | ICD-10-CM | POA: Diagnosis not present

## 2020-11-27 DIAGNOSIS — E1140 Type 2 diabetes mellitus with diabetic neuropathy, unspecified: Secondary | ICD-10-CM | POA: Diagnosis not present

## 2020-11-27 DIAGNOSIS — E1151 Type 2 diabetes mellitus with diabetic peripheral angiopathy without gangrene: Secondary | ICD-10-CM | POA: Diagnosis not present

## 2020-11-27 DIAGNOSIS — K5900 Constipation, unspecified: Secondary | ICD-10-CM | POA: Diagnosis not present

## 2020-11-27 DIAGNOSIS — M810 Age-related osteoporosis without current pathological fracture: Secondary | ICD-10-CM | POA: Diagnosis not present

## 2020-11-27 DIAGNOSIS — D649 Anemia, unspecified: Secondary | ICD-10-CM | POA: Diagnosis not present

## 2020-11-27 DIAGNOSIS — E039 Hypothyroidism, unspecified: Secondary | ICD-10-CM | POA: Diagnosis not present

## 2020-11-27 DIAGNOSIS — N3281 Overactive bladder: Secondary | ICD-10-CM | POA: Diagnosis not present

## 2020-11-27 DIAGNOSIS — Z87440 Personal history of urinary (tract) infections: Secondary | ICD-10-CM | POA: Diagnosis not present

## 2020-11-27 DIAGNOSIS — M797 Fibromyalgia: Secondary | ICD-10-CM | POA: Diagnosis not present

## 2020-11-27 DIAGNOSIS — K589 Irritable bowel syndrome without diarrhea: Secondary | ICD-10-CM | POA: Diagnosis not present

## 2020-11-27 DIAGNOSIS — H9190 Unspecified hearing loss, unspecified ear: Secondary | ICD-10-CM | POA: Diagnosis not present

## 2020-11-27 DIAGNOSIS — R1314 Dysphagia, pharyngoesophageal phase: Secondary | ICD-10-CM | POA: Diagnosis not present

## 2020-11-27 DIAGNOSIS — Z9181 History of falling: Secondary | ICD-10-CM | POA: Diagnosis not present

## 2020-11-27 DIAGNOSIS — R3915 Urgency of urination: Secondary | ICD-10-CM | POA: Diagnosis not present

## 2020-11-27 DIAGNOSIS — Z7984 Long term (current) use of oral hypoglycemic drugs: Secondary | ICD-10-CM | POA: Diagnosis not present

## 2020-11-27 DIAGNOSIS — E785 Hyperlipidemia, unspecified: Secondary | ICD-10-CM | POA: Diagnosis not present

## 2020-11-27 DIAGNOSIS — I10 Essential (primary) hypertension: Secondary | ICD-10-CM | POA: Diagnosis not present

## 2020-11-27 DIAGNOSIS — K219 Gastro-esophageal reflux disease without esophagitis: Secondary | ICD-10-CM | POA: Diagnosis not present

## 2020-11-27 DIAGNOSIS — M792 Neuralgia and neuritis, unspecified: Secondary | ICD-10-CM | POA: Diagnosis not present

## 2020-11-28 ENCOUNTER — Other Ambulatory Visit: Payer: Self-pay | Admitting: Physician Assistant

## 2020-11-28 ENCOUNTER — Ambulatory Visit: Payer: Medicare Other | Admitting: Family Medicine

## 2020-11-28 DIAGNOSIS — M519 Unspecified thoracic, thoracolumbar and lumbosacral intervertebral disc disorder: Secondary | ICD-10-CM | POA: Diagnosis not present

## 2020-11-28 DIAGNOSIS — R339 Retention of urine, unspecified: Secondary | ICD-10-CM | POA: Diagnosis not present

## 2020-11-28 DIAGNOSIS — M792 Neuralgia and neuritis, unspecified: Secondary | ICD-10-CM | POA: Diagnosis not present

## 2020-11-28 DIAGNOSIS — E1140 Type 2 diabetes mellitus with diabetic neuropathy, unspecified: Secondary | ICD-10-CM | POA: Diagnosis not present

## 2020-11-28 DIAGNOSIS — M797 Fibromyalgia: Secondary | ICD-10-CM | POA: Diagnosis not present

## 2020-11-28 DIAGNOSIS — K219 Gastro-esophageal reflux disease without esophagitis: Secondary | ICD-10-CM | POA: Diagnosis not present

## 2020-11-28 DIAGNOSIS — E785 Hyperlipidemia, unspecified: Secondary | ICD-10-CM | POA: Diagnosis not present

## 2020-11-28 DIAGNOSIS — Z7984 Long term (current) use of oral hypoglycemic drugs: Secondary | ICD-10-CM | POA: Diagnosis not present

## 2020-11-28 DIAGNOSIS — E1151 Type 2 diabetes mellitus with diabetic peripheral angiopathy without gangrene: Secondary | ICD-10-CM | POA: Diagnosis not present

## 2020-11-28 DIAGNOSIS — I2510 Atherosclerotic heart disease of native coronary artery without angina pectoris: Secondary | ICD-10-CM | POA: Diagnosis not present

## 2020-11-28 DIAGNOSIS — D649 Anemia, unspecified: Secondary | ICD-10-CM | POA: Diagnosis not present

## 2020-11-28 DIAGNOSIS — I10 Essential (primary) hypertension: Secondary | ICD-10-CM | POA: Diagnosis not present

## 2020-11-28 DIAGNOSIS — M1711 Unilateral primary osteoarthritis, right knee: Secondary | ICD-10-CM | POA: Diagnosis not present

## 2020-11-28 DIAGNOSIS — R3915 Urgency of urination: Secondary | ICD-10-CM | POA: Diagnosis not present

## 2020-11-28 DIAGNOSIS — R1314 Dysphagia, pharyngoesophageal phase: Secondary | ICD-10-CM | POA: Diagnosis not present

## 2020-11-28 DIAGNOSIS — K5900 Constipation, unspecified: Secondary | ICD-10-CM | POA: Diagnosis not present

## 2020-11-28 DIAGNOSIS — E039 Hypothyroidism, unspecified: Secondary | ICD-10-CM | POA: Diagnosis not present

## 2020-11-28 DIAGNOSIS — M1991 Primary osteoarthritis, unspecified site: Secondary | ICD-10-CM | POA: Diagnosis not present

## 2020-11-28 DIAGNOSIS — K589 Irritable bowel syndrome without diarrhea: Secondary | ICD-10-CM | POA: Diagnosis not present

## 2020-11-28 DIAGNOSIS — H9190 Unspecified hearing loss, unspecified ear: Secondary | ICD-10-CM | POA: Diagnosis not present

## 2020-11-28 DIAGNOSIS — N3281 Overactive bladder: Secondary | ICD-10-CM | POA: Diagnosis not present

## 2020-11-28 DIAGNOSIS — Z87440 Personal history of urinary (tract) infections: Secondary | ICD-10-CM | POA: Diagnosis not present

## 2020-11-28 DIAGNOSIS — M810 Age-related osteoporosis without current pathological fracture: Secondary | ICD-10-CM | POA: Diagnosis not present

## 2020-11-28 DIAGNOSIS — Z9181 History of falling: Secondary | ICD-10-CM | POA: Diagnosis not present

## 2020-11-28 MED ORDER — ONETOUCH ULTRA VI STRP: ORAL_STRIP | 3 refills | Status: DC

## 2020-12-04 ENCOUNTER — Telehealth: Payer: Self-pay

## 2020-12-04 DIAGNOSIS — Z87440 Personal history of urinary (tract) infections: Secondary | ICD-10-CM | POA: Diagnosis not present

## 2020-12-04 DIAGNOSIS — I2510 Atherosclerotic heart disease of native coronary artery without angina pectoris: Secondary | ICD-10-CM | POA: Diagnosis not present

## 2020-12-04 DIAGNOSIS — M519 Unspecified thoracic, thoracolumbar and lumbosacral intervertebral disc disorder: Secondary | ICD-10-CM | POA: Diagnosis not present

## 2020-12-04 DIAGNOSIS — R3915 Urgency of urination: Secondary | ICD-10-CM | POA: Diagnosis not present

## 2020-12-04 DIAGNOSIS — M810 Age-related osteoporosis without current pathological fracture: Secondary | ICD-10-CM | POA: Diagnosis not present

## 2020-12-04 DIAGNOSIS — E039 Hypothyroidism, unspecified: Secondary | ICD-10-CM | POA: Diagnosis not present

## 2020-12-04 DIAGNOSIS — H9190 Unspecified hearing loss, unspecified ear: Secondary | ICD-10-CM | POA: Diagnosis not present

## 2020-12-04 DIAGNOSIS — M792 Neuralgia and neuritis, unspecified: Secondary | ICD-10-CM | POA: Diagnosis not present

## 2020-12-04 DIAGNOSIS — M1991 Primary osteoarthritis, unspecified site: Secondary | ICD-10-CM | POA: Diagnosis not present

## 2020-12-04 DIAGNOSIS — E1140 Type 2 diabetes mellitus with diabetic neuropathy, unspecified: Secondary | ICD-10-CM | POA: Diagnosis not present

## 2020-12-04 DIAGNOSIS — E785 Hyperlipidemia, unspecified: Secondary | ICD-10-CM | POA: Diagnosis not present

## 2020-12-04 DIAGNOSIS — N3281 Overactive bladder: Secondary | ICD-10-CM | POA: Diagnosis not present

## 2020-12-04 DIAGNOSIS — K219 Gastro-esophageal reflux disease without esophagitis: Secondary | ICD-10-CM | POA: Diagnosis not present

## 2020-12-04 DIAGNOSIS — D649 Anemia, unspecified: Secondary | ICD-10-CM | POA: Diagnosis not present

## 2020-12-04 DIAGNOSIS — E1151 Type 2 diabetes mellitus with diabetic peripheral angiopathy without gangrene: Secondary | ICD-10-CM | POA: Diagnosis not present

## 2020-12-04 DIAGNOSIS — R339 Retention of urine, unspecified: Secondary | ICD-10-CM | POA: Diagnosis not present

## 2020-12-04 DIAGNOSIS — K589 Irritable bowel syndrome without diarrhea: Secondary | ICD-10-CM | POA: Diagnosis not present

## 2020-12-04 DIAGNOSIS — K5900 Constipation, unspecified: Secondary | ICD-10-CM | POA: Diagnosis not present

## 2020-12-04 DIAGNOSIS — I10 Essential (primary) hypertension: Secondary | ICD-10-CM | POA: Diagnosis not present

## 2020-12-04 DIAGNOSIS — M797 Fibromyalgia: Secondary | ICD-10-CM | POA: Diagnosis not present

## 2020-12-04 DIAGNOSIS — Z9181 History of falling: Secondary | ICD-10-CM | POA: Diagnosis not present

## 2020-12-04 DIAGNOSIS — R1314 Dysphagia, pharyngoesophageal phase: Secondary | ICD-10-CM | POA: Diagnosis not present

## 2020-12-04 DIAGNOSIS — Z7984 Long term (current) use of oral hypoglycemic drugs: Secondary | ICD-10-CM | POA: Diagnosis not present

## 2020-12-04 NOTE — Telephone Encounter (Signed)
The Home PT therapist called to report that he was out to visit Cindy Gordon this morning and she reported falling earlier today.  She has a small bruise on her knee.  She is scheduled to follow-up with Dr. Samule Dry tomorrow.

## 2020-12-05 ENCOUNTER — Other Ambulatory Visit: Payer: Self-pay | Admitting: Family Medicine

## 2020-12-05 DIAGNOSIS — E1151 Type 2 diabetes mellitus with diabetic peripheral angiopathy without gangrene: Secondary | ICD-10-CM | POA: Diagnosis not present

## 2020-12-05 DIAGNOSIS — H9190 Unspecified hearing loss, unspecified ear: Secondary | ICD-10-CM | POA: Diagnosis not present

## 2020-12-05 DIAGNOSIS — I2510 Atherosclerotic heart disease of native coronary artery without angina pectoris: Secondary | ICD-10-CM | POA: Diagnosis not present

## 2020-12-05 DIAGNOSIS — M1711 Unilateral primary osteoarthritis, right knee: Secondary | ICD-10-CM | POA: Diagnosis not present

## 2020-12-05 DIAGNOSIS — Z87440 Personal history of urinary (tract) infections: Secondary | ICD-10-CM | POA: Diagnosis not present

## 2020-12-05 DIAGNOSIS — K5900 Constipation, unspecified: Secondary | ICD-10-CM | POA: Diagnosis not present

## 2020-12-05 DIAGNOSIS — I10 Essential (primary) hypertension: Secondary | ICD-10-CM | POA: Diagnosis not present

## 2020-12-05 DIAGNOSIS — N3281 Overactive bladder: Secondary | ICD-10-CM | POA: Diagnosis not present

## 2020-12-05 DIAGNOSIS — E1140 Type 2 diabetes mellitus with diabetic neuropathy, unspecified: Secondary | ICD-10-CM | POA: Diagnosis not present

## 2020-12-05 DIAGNOSIS — K219 Gastro-esophageal reflux disease without esophagitis: Secondary | ICD-10-CM | POA: Diagnosis not present

## 2020-12-05 DIAGNOSIS — K589 Irritable bowel syndrome without diarrhea: Secondary | ICD-10-CM | POA: Diagnosis not present

## 2020-12-05 DIAGNOSIS — D649 Anemia, unspecified: Secondary | ICD-10-CM | POA: Diagnosis not present

## 2020-12-05 DIAGNOSIS — M797 Fibromyalgia: Secondary | ICD-10-CM | POA: Diagnosis not present

## 2020-12-05 DIAGNOSIS — R339 Retention of urine, unspecified: Secondary | ICD-10-CM | POA: Diagnosis not present

## 2020-12-05 DIAGNOSIS — R3915 Urgency of urination: Secondary | ICD-10-CM | POA: Diagnosis not present

## 2020-12-05 DIAGNOSIS — M1991 Primary osteoarthritis, unspecified site: Secondary | ICD-10-CM | POA: Diagnosis not present

## 2020-12-05 DIAGNOSIS — M519 Unspecified thoracic, thoracolumbar and lumbosacral intervertebral disc disorder: Secondary | ICD-10-CM | POA: Diagnosis not present

## 2020-12-05 DIAGNOSIS — E785 Hyperlipidemia, unspecified: Secondary | ICD-10-CM | POA: Diagnosis not present

## 2020-12-05 DIAGNOSIS — M792 Neuralgia and neuritis, unspecified: Secondary | ICD-10-CM | POA: Diagnosis not present

## 2020-12-05 DIAGNOSIS — Z9181 History of falling: Secondary | ICD-10-CM | POA: Diagnosis not present

## 2020-12-05 DIAGNOSIS — R1314 Dysphagia, pharyngoesophageal phase: Secondary | ICD-10-CM | POA: Diagnosis not present

## 2020-12-05 DIAGNOSIS — M810 Age-related osteoporosis without current pathological fracture: Secondary | ICD-10-CM | POA: Diagnosis not present

## 2020-12-05 DIAGNOSIS — Z7984 Long term (current) use of oral hypoglycemic drugs: Secondary | ICD-10-CM | POA: Diagnosis not present

## 2020-12-05 DIAGNOSIS — E039 Hypothyroidism, unspecified: Secondary | ICD-10-CM | POA: Diagnosis not present

## 2020-12-06 ENCOUNTER — Telehealth: Payer: Self-pay

## 2020-12-06 NOTE — Chronic Care Management (AMB) (Signed)
Chronic Care Management Pharmacy Assistant   Name: Cindy Gordon  MRN: [id number] DOB: [birth year]  Reason for Encounter: Initial questions for appointment with Donette Larry, CPP    PCP : Rochel Brome, MD  Allergies:   Allergies  Allergen Reactions   Contrast Media [Iodinated Diagnostic Agents] Other (See Comments)    Cardiac arrest   Hydrocodone Other (See Comments)    Hallucinations   Codeine    Crestor [Rosuvastatin] Other (See Comments)    Myalgia    Lisinopril    Naproxen     constipation   Niaspan [Niacin] Other (See Comments)    flushing   Penicillins     Medications: Outpatient Encounter Medications as of 12/06/2020  Medication Sig   acetaminophen (TYLENOL) 325 MG tablet Take 650 mg by mouth every 4 (four) hours as needed.   amLODipine (NORVASC) 2.5 MG tablet TAKE ONE TABLET BY MOUTH EVERY DAY   aspirin EC 81 MG tablet Take 81 mg by mouth daily. Swallow whole.   Biotin 1000 MCG tablet Take 1,000 mcg by mouth daily.   Blood Glucose Monitoring Suppl (ONE TOUCH ULTRA MINI) w/Device KIT Use daily to check blood sugar levels. E11.42   calcium-vitamin D (OSCAL WITH D) 500-200 MG-UNIT tablet Take 2 tablets by mouth daily. For osteoporosis.   celecoxib (CELEBREX) 200 MG capsule TAKE ONE CAPSULE BY MOUTH EVERY DAY   diclofenac Sodium (VOLTAREN) 1 % GEL Apply 1 application topically 2 (two) times daily.   dicyclomine (BENTYL) 10 MG capsule Take 10 mg by mouth 4 (four) times daily -  before meals and at bedtime.   furosemide (LASIX) 40 MG tablet Take 40 mg by mouth daily as needed for edema.   glucose blood (ONETOUCH ULTRA) test strip CHECK BLOOD SUGAR EVERY DAY   hydrocortisone (PROCTOZONE-HC) 2.5 % rectal cream Place 1 application rectally every 6 (six) hours as needed for hemorrhoids or anal itching.   levothyroxine (SYNTHROID) 50 MCG tablet TAKE ONE TABLET BY MOUTH EVERY MORNING 30 MIN-1 HOUR BEFORE EATING WITH WATER   losartan (COZAAR) 50 MG  tablet Take 1 tablet (50 mg total) by mouth daily.   meclizine (ANTIVERT) 25 MG tablet Take 1 tablet by mouth 3 (three) times daily as needed for dizziness.   metFORMIN (GLUCOPHAGE) 500 MG tablet Take 1 tablet (500 mg total) by mouth daily with breakfast.   Milnacipran HCl (SAVELLA) 100 MG TABS tablet Take 1 tablet (100 mg total) by mouth 2 (two) times daily.   Multiple Vitamin (MULTIVITAMIN) tablet Take 1 tablet by mouth daily.   Omega-3 Fatty Acids (FISH OIL PO) Take 1 capsule by mouth daily. For cholesterol   pantoprazole (PROTONIX) 40 MG tablet Take 40 mg by mouth daily.   polyethylene glycol (MIRALAX / GLYCOLAX) 17 g packet Take 17 g by mouth daily.   pregabalin (LYRICA) 300 MG capsule TAKE ONE CAPSULE BY MOUTH 2 TIMES A DAY   rosuvastatin (CRESTOR) 5 MG tablet TAKE ONE TABLET BY MOUTH EVERY DAY   solifenacin (VESICARE) 10 MG tablet Take 1 tablet (10 mg total) by mouth daily.   traMADol (ULTRAM) 50 MG tablet Take 50 mg by mouth every 12 (twelve) hours as needed.   vitamin B-12 (CYANOCOBALAMIN) 1000 MCG tablet Take 1,000 mcg by mouth daily.   No facility-administered encounter medications on file as of 12/06/2020.    Current Diagnosis: Patient Active Problem List   Diagnosis Date Noted   Urge incontinence 07/23/2020   Mixed hyperlipidemia 03/15/2020   Controlled type  2 diabetes mellitus with diabetic polyneuropathy, without long-term current use of insulin ([place]) 03/15/2020   Atrophy of thyroid 03/15/2020   Fibromyalgia 03/15/2020   Gastroesophageal reflux disease without esophagitis 03/15/2020   OSA (obstructive sleep apnea) 03/15/2020   Class 1 obesity due to excess calories with serious comorbidity and body mass index (BMI) of 32.0 to 32.9 in adult 03/15/2020   Essential hypertension, benign 01/23/2020   Acute cystitis without hematuria 01/23/2020   Vertigo 01/23/2020   Fall on same level 01/23/2020   Abrasion of right arm 01/23/2020   Have you seen any  other providers since your last visit? No  Any changes in your medications or health? Yes, patient has frequent falls, most recent fall was 12/06/20, she stated she bumped her head, no LOC no bleeding noted.  Any side effects from any medications? No side effects were noted by the patient.  Do you have an symptoms or problems not managed by your medications? Patient has no other issues that are unmanaged.  Any concerns about your health right now? Yes, patient is worried about falling, and has inquired about getting meals on wheels started, she has even asked her home health workers, but it has not started yet.  Has your provider asked that you check blood pressure, blood sugar, or follow special diet at home? Patient stated she is out of strips to check her blood sugar, she does not check her blood pressure.  Do you get any type of exercise on a regular basis? Patient states she is not able to walk due to getting injections in knee from Dr. Lorin Mercy.  Can you think of a goal you would like to reach for your health? Patient could not think of a goal.  Do you have any problems getting your medications? Patient has no issues getting her medications.   Is there anything that you would like to discuss during the appointment? Patient could not think of anything.  Patient is aware this is a phone appointment and medications will be discussed.   Follow-Up:  Pharmacist Review  Donette Larry, CPP notified  Clarita Leber, [place] Pharmacist Assistant [phone]

## 2020-12-07 DIAGNOSIS — R1314 Dysphagia, pharyngoesophageal phase: Secondary | ICD-10-CM | POA: Diagnosis not present

## 2020-12-07 DIAGNOSIS — E039 Hypothyroidism, unspecified: Secondary | ICD-10-CM | POA: Diagnosis not present

## 2020-12-07 DIAGNOSIS — Z7984 Long term (current) use of oral hypoglycemic drugs: Secondary | ICD-10-CM | POA: Diagnosis not present

## 2020-12-07 DIAGNOSIS — E785 Hyperlipidemia, unspecified: Secondary | ICD-10-CM | POA: Diagnosis not present

## 2020-12-07 DIAGNOSIS — R339 Retention of urine, unspecified: Secondary | ICD-10-CM | POA: Diagnosis not present

## 2020-12-07 DIAGNOSIS — K5900 Constipation, unspecified: Secondary | ICD-10-CM | POA: Diagnosis not present

## 2020-12-07 DIAGNOSIS — Z9181 History of falling: Secondary | ICD-10-CM | POA: Diagnosis not present

## 2020-12-07 DIAGNOSIS — Z87440 Personal history of urinary (tract) infections: Secondary | ICD-10-CM | POA: Diagnosis not present

## 2020-12-07 DIAGNOSIS — E1140 Type 2 diabetes mellitus with diabetic neuropathy, unspecified: Secondary | ICD-10-CM | POA: Diagnosis not present

## 2020-12-07 DIAGNOSIS — M1991 Primary osteoarthritis, unspecified site: Secondary | ICD-10-CM | POA: Diagnosis not present

## 2020-12-07 DIAGNOSIS — E1151 Type 2 diabetes mellitus with diabetic peripheral angiopathy without gangrene: Secondary | ICD-10-CM | POA: Diagnosis not present

## 2020-12-07 DIAGNOSIS — H9190 Unspecified hearing loss, unspecified ear: Secondary | ICD-10-CM | POA: Diagnosis not present

## 2020-12-07 DIAGNOSIS — I2510 Atherosclerotic heart disease of native coronary artery without angina pectoris: Secondary | ICD-10-CM | POA: Diagnosis not present

## 2020-12-07 DIAGNOSIS — N3281 Overactive bladder: Secondary | ICD-10-CM | POA: Diagnosis not present

## 2020-12-07 DIAGNOSIS — R3915 Urgency of urination: Secondary | ICD-10-CM | POA: Diagnosis not present

## 2020-12-07 DIAGNOSIS — M810 Age-related osteoporosis without current pathological fracture: Secondary | ICD-10-CM | POA: Diagnosis not present

## 2020-12-07 DIAGNOSIS — I10 Essential (primary) hypertension: Secondary | ICD-10-CM | POA: Diagnosis not present

## 2020-12-07 DIAGNOSIS — D649 Anemia, unspecified: Secondary | ICD-10-CM | POA: Diagnosis not present

## 2020-12-07 DIAGNOSIS — K589 Irritable bowel syndrome without diarrhea: Secondary | ICD-10-CM | POA: Diagnosis not present

## 2020-12-07 DIAGNOSIS — M792 Neuralgia and neuritis, unspecified: Secondary | ICD-10-CM | POA: Diagnosis not present

## 2020-12-07 DIAGNOSIS — K219 Gastro-esophageal reflux disease without esophagitis: Secondary | ICD-10-CM | POA: Diagnosis not present

## 2020-12-07 DIAGNOSIS — M519 Unspecified thoracic, thoracolumbar and lumbosacral intervertebral disc disorder: Secondary | ICD-10-CM | POA: Diagnosis not present

## 2020-12-07 DIAGNOSIS — M797 Fibromyalgia: Secondary | ICD-10-CM | POA: Diagnosis not present

## 2020-12-11 DIAGNOSIS — H9190 Unspecified hearing loss, unspecified ear: Secondary | ICD-10-CM | POA: Diagnosis not present

## 2020-12-11 DIAGNOSIS — K5900 Constipation, unspecified: Secondary | ICD-10-CM | POA: Diagnosis not present

## 2020-12-11 DIAGNOSIS — K589 Irritable bowel syndrome without diarrhea: Secondary | ICD-10-CM | POA: Diagnosis not present

## 2020-12-11 DIAGNOSIS — N3281 Overactive bladder: Secondary | ICD-10-CM | POA: Diagnosis not present

## 2020-12-11 DIAGNOSIS — I10 Essential (primary) hypertension: Secondary | ICD-10-CM | POA: Diagnosis not present

## 2020-12-11 DIAGNOSIS — K219 Gastro-esophageal reflux disease without esophagitis: Secondary | ICD-10-CM | POA: Diagnosis not present

## 2020-12-11 DIAGNOSIS — M810 Age-related osteoporosis without current pathological fracture: Secondary | ICD-10-CM | POA: Diagnosis not present

## 2020-12-11 DIAGNOSIS — R1314 Dysphagia, pharyngoesophageal phase: Secondary | ICD-10-CM | POA: Diagnosis not present

## 2020-12-11 DIAGNOSIS — D649 Anemia, unspecified: Secondary | ICD-10-CM | POA: Diagnosis not present

## 2020-12-11 DIAGNOSIS — E1151 Type 2 diabetes mellitus with diabetic peripheral angiopathy without gangrene: Secondary | ICD-10-CM | POA: Diagnosis not present

## 2020-12-11 DIAGNOSIS — E1140 Type 2 diabetes mellitus with diabetic neuropathy, unspecified: Secondary | ICD-10-CM | POA: Diagnosis not present

## 2020-12-11 DIAGNOSIS — R3915 Urgency of urination: Secondary | ICD-10-CM | POA: Diagnosis not present

## 2020-12-11 DIAGNOSIS — R339 Retention of urine, unspecified: Secondary | ICD-10-CM | POA: Diagnosis not present

## 2020-12-11 DIAGNOSIS — M1991 Primary osteoarthritis, unspecified site: Secondary | ICD-10-CM | POA: Diagnosis not present

## 2020-12-11 DIAGNOSIS — E785 Hyperlipidemia, unspecified: Secondary | ICD-10-CM | POA: Diagnosis not present

## 2020-12-11 DIAGNOSIS — Z87440 Personal history of urinary (tract) infections: Secondary | ICD-10-CM | POA: Diagnosis not present

## 2020-12-11 DIAGNOSIS — M792 Neuralgia and neuritis, unspecified: Secondary | ICD-10-CM | POA: Diagnosis not present

## 2020-12-11 DIAGNOSIS — Z9181 History of falling: Secondary | ICD-10-CM | POA: Diagnosis not present

## 2020-12-11 DIAGNOSIS — M519 Unspecified thoracic, thoracolumbar and lumbosacral intervertebral disc disorder: Secondary | ICD-10-CM | POA: Diagnosis not present

## 2020-12-11 DIAGNOSIS — E039 Hypothyroidism, unspecified: Secondary | ICD-10-CM | POA: Diagnosis not present

## 2020-12-11 DIAGNOSIS — I2510 Atherosclerotic heart disease of native coronary artery without angina pectoris: Secondary | ICD-10-CM | POA: Diagnosis not present

## 2020-12-11 DIAGNOSIS — Z7984 Long term (current) use of oral hypoglycemic drugs: Secondary | ICD-10-CM | POA: Diagnosis not present

## 2020-12-11 DIAGNOSIS — M797 Fibromyalgia: Secondary | ICD-10-CM | POA: Diagnosis not present

## 2020-12-11 NOTE — Chronic Care Management (AMB) (Signed)
Chronic Care Management Pharmacy  Name: Cindy Gordon  MRN: [id number] DOB: October 05, 1941  Chief Complaint/ HPI  Cindy Gordon,  80 y.o. , female presents for their Initial CCM visit with the clinical pharmacist via telephone due to COVID-19 Pandemic.  PCP : Rochel Brome, MD   Plan Recommendations:   Pharmacist coordinated appointment for COVID booster.   Pharmacist coordinated refill request for testing suppies.   Recommended resuming calcium (patient is out).   Does the patient need to follow-up with GI or continue pantoprazole after November hospitalization?   Their chronic conditions include: hypertension, OSA, GERD, type 2 Diabetes with diabetic polyneuropathy, atrophy of thyroid, mixed hyperlipidemia, fibromyalgia, urge incontinence, obesity.   Office Visits: 10/25/2020 - follow-up after rehab discharge - vitamin D ordered. Referral to [facility] [facility]. Continue metformin 500 mg daily. Continue home health pt.  09/17/2020 - start Macrobid. Instructed to call Dr. Tamera Punt back for left shoulder. Do not take ibuprofen with Celebrex.  07/23/2020 - keflex for 7 days for acute cystitis. Tramadol for fibromyalgia pain. Flu vaccine given. Increase Vesicare 10 mg daily. Stop Meloxicam and start on Celebrex.  06/19/2020 - urine culture. Prescribed phenazopyridine 200 mg tid.  06/14/2020 -  Continue current medications  Consult Visit: 12/05/2020 -Ortho - gel shot in knee. 11/28/2020 - Ortho - gel shot in knee.  11/14/2020 - Ortho - patient seen for knee pain. Patient using tramadol prn. Knee buckled and sever pain since.  10/08/2020 - Ortho - Shoulder pain.  09/27/2020 - Admitted to [facility] for Rehab - after fall and spent 4 hours in floor.  09/23/2020 - ED to hospital for UTI and knee pain.  Medications: Outpatient Encounter Medications as of 12/12/2020  Medication Sig  . acetaminophen (TYLENOL) 325 MG tablet Take 650 mg by mouth every 4 (four) hours as needed.  Marland Kitchen amLODipine  (NORVASC) 2.5 MG tablet TAKE ONE TABLET BY MOUTH EVERY DAY  . aspirin EC 81 MG tablet Take 81 mg by mouth daily. Swallow whole.  . Biotin 1000 MCG tablet Take 1,000 mcg by mouth daily.  . Blood Glucose Monitoring Suppl (ONE TOUCH ULTRA MINI) w/Device KIT Use daily to check blood sugar levels. E11.42  . celecoxib (CELEBREX) 200 MG capsule TAKE ONE CAPSULE BY MOUTH EVERY DAY  . cholecalciferol (VITAMIN D3) 25 MCG (1000 UNIT) tablet Take 2,000 Units by mouth daily.  . diclofenac Sodium (VOLTAREN) 1 % GEL Apply 1 application topically 2 (two) times daily.  Marland Kitchen dicyclomine (BENTYL) 10 MG capsule Take 10 mg by mouth 3 (three) times daily as needed.  Marland Kitchen levothyroxine (SYNTHROID) 50 MCG tablet TAKE ONE TABLET BY MOUTH EVERY MORNING 30 MIN-1 HOUR BEFORE EATING WITH WATER  . losartan (COZAAR) 50 MG tablet Take 1 tablet (50 mg total) by mouth daily. (Patient taking differently: Take 50 mg by mouth at bedtime.)  . meclizine (ANTIVERT) 25 MG tablet Take 1 tablet by mouth 3 (three) times daily as needed for dizziness.  . metFORMIN (GLUCOPHAGE) 500 MG tablet Take 1 tablet (500 mg total) by mouth daily with breakfast.  . Milnacipran HCl (SAVELLA) 100 MG TABS tablet Take 1 tablet (100 mg total) by mouth 2 (two) times daily.  . Multiple Vitamin (MULTIVITAMIN) tablet Take 1 tablet by mouth daily.  . Omega-3 Fatty Acids (FISH OIL PO) Take 1 capsule by mouth daily. For cholesterol  . polyethylene glycol (MIRALAX / GLYCOLAX) 17 g packet Take 17 g by mouth daily. (Patient taking differently: Take 17 g by mouth daily as needed.)  .  pregabalin (LYRICA) 300 MG capsule TAKE ONE CAPSULE BY MOUTH 2 TIMES A DAY  . rosuvastatin (CRESTOR) 5 MG tablet TAKE ONE TABLET BY MOUTH EVERY DAY  . solifenacin (VESICARE) 10 MG tablet Take 1 tablet (10 mg total) by mouth daily.  . traMADol (ULTRAM) 50 MG tablet Take 50 mg by mouth every 12 (twelve) hours as needed.  . vitamin B-12 (CYANOCOBALAMIN) 1000 MCG tablet Take 1,000 mcg by mouth  daily.  . [DISCONTINUED] glucose blood (ONETOUCH ULTRA) test strip CHECK BLOOD SUGAR EVERY DAY  . calcium-vitamin D (OSCAL WITH D) 500-200 MG-UNIT tablet Take 2 tablets by mouth daily. For osteoporosis. (Patient not taking: Reported on 12/12/2020)  . furosemide (LASIX) 40 MG tablet Take 40 mg by mouth daily as needed for edema. (Patient not taking: Reported on 12/12/2020)  . hydrocortisone (ANUSOL-HC) 2.5 % rectal cream Place 1 application rectally every 6 (six) hours as needed for hemorrhoids or anal itching. (Patient not taking: Reported on 12/12/2020)  . pantoprazole (PROTONIX) 40 MG tablet Take 40 mg by mouth daily. (Patient not taking: Reported on 12/12/2020)   No facility-administered encounter medications on file as of 12/12/2020.   Allergies  Allergen Reactions  . Contrast Media [Iodinated Diagnostic Agents] Other (See Comments)    Cardiac arrest  . Hydrocodone Other (See Comments)    Hallucinations  . Codeine   . Crestor [Rosuvastatin] Other (See Comments)    Myalgia   . Lisinopril   . Naproxen     constipation  . Niaspan [Niacin] Other (See Comments)    flushing  . Penicillins    SDOH Screenings   Alcohol Screen: Not on file  Depression (PHQ2-9): Low Risk   . PHQ-2 Score: 0  Financial Resource Strain: Not on file  Food Insecurity: No Food Insecurity  . Worried About Charity fundraiser in the Last Year: Never true  . Ran Out of Food in the Last Year: Never true  Housing: Low Risk   . Last Housing Risk Score: 0  Physical Activity: Not on file  Social Connections: Not on file  Stress: Not on file  Tobacco Use: Low Risk   . Smoking Tobacco Use: Never Smoker  . Smokeless Tobacco Use: Never Used  Transportation Needs: Unknown  . Lack of Transportation (Medical): Not on file  . Lack of Transportation (Non-Medical): No     Current Diagnosis/Assessment:  Goals Addressed            This Visit's Progress   . Pharmacy Care [place]       CARE PLAN ENTRY (see longitudinal  plan of care for additional care plan information)  Current Barriers:  . Chronic Disease Management support, education, and care coordination needs related to Hypertension, Hyperlipidemia, and Diabetes   Hypertension BP Readings from Last 3 Encounters:  10/25/20 118/62  09/17/20 128/64  06/19/20 118/62   . Pharmacist Clinical Goal(s): o Over the next 90 days, patient will work with PharmD and providers to maintain BP goal <130/80 . Current regimen:   Amlodipine 2.5 mg daily   Furosemide 40 mg daily prn edema  Losartan 50 mg daily  . Interventions: o Discussed healthy diet of lean protein, low salt, fruit and vegetables.  . Patient self care activities - Over the next 90 days, patient will: o Check BP weekly, document, and provide at future appointments o Ensure daily salt intake < 2300 mg/day  Hyperlipidemia Lab Results  Component Value Date/Time   LDLCALC 69 09/17/2020 10:14 AM   . Pharmacist  Clinical Goal(s): o Over the next 90 days, patient will work with PharmD and providers to maintain LDL goal < 70 . Current regimen:  . aspirin ec 81 mg daily  . Rosuvastatin 5 mg daily  . Omega-3 fatty acids daily for cholesterol . Interventions: o Discussed taking medication daily as prescribed.  o Encouraged completing physical therapy exercises daily to avoid falls and stay active.  . Patient self care activities - Over the next 90 days, patient will: o Continue to take medication as prescribed.   Diabetes Lab Results  Component Value Date/Time   HGBA1C 6.7 (H) 09/17/2020 10:14 AM   HGBA1C 7.1 (H) 06/14/2020 11:32 AM   . Pharmacist Clinical Goal(s): o Over the next 90 days, patient will work with PharmD and providers to maintain A1c goal <7% . Current regimen:   One touch ultra mini daily - doesn't seem to be getting test strips  Metformin 500 mg daily with breakfast . Interventions: o Coordinated refill of test strips with local pharmacy.  o Discussed healthy diet  of lean protein, fruits and vegetables.  o Encouraged patient to continue daily exercises as prescribed by physical therapy.  . Patient self care activities - Over the next 90 days, patient will: o Check blood sugar once daily, document, and provide at future appointments o Contact provider with any episodes of hypoglycemia  Medication management . Pharmacist Clinical Goal(s): o Over the next 90 days, patient will work with PharmD and providers to achieve optimal medication adherence . Current pharmacy: [facility]  . Interventions o Comprehensive medication review performed. o Continue current medication management strategy . Patient self care activities - Over the next 90 days, patient will: o Focus on medication adherence by using pill box.  o Take medications as prescribed o Report any questions or concerns to PharmD and/or provider(s)  Initial goal documentation        Diabetes   Recent Relevant Labs: Lab Results  Component Value Date/Time   HGBA1C 6.7 (H) 09/17/2020 10:14 AM   HGBA1C 7.1 (H) 06/14/2020 11:32 AM   MICROALBUR 80 09/17/2020 10:18 AM     Checking BG: out of strips currently.   Recent FBG Readings: out of strips Patient has failed these meds in past: none reported Patient is currently controlled on the following medications:   One touch ultra mini daily - doesn't seem to be getting test strips  Metformin 500 mg daily with breakfast  Last diabetic Foot exam: No results found for: HMDIABEYEEXA  Last diabetic Eye exam: No results found for: HMDIABFOOTEX   We discussed: diet and exercise extensively.   Patient reports that she doesn't have test strips. Pharmacist requested prescription for test strips sent to [facility]. She states she enjoys fruits and vegetables. Currently getting exercise through physical therapy and strengthening knees. Due for updated lab with visit on 12/24/2020. Patient reminded on the phone to fast for appointment.    Plan  Requesting new prescription for test strips sent to [facility].  Hypertension   BP today is:  <130/80  Office blood pressures are  BP Readings from Last 3 Encounters:  10/25/20 118/62  09/17/20 128/64  06/19/20 118/62    Patient has failed these meds in the past: none reported Patient is currently controlled on the following medications:   Amlodipine 2.5 mg daily   Furosemide 40 mg daily prn edema  Losartan 50 mg daily   Patient checks BP at home weekly  Patient home BP readings are ranging:  Patient states home readings are "good". Doesn't have specific numbers. Home health checks bp each time they come per patient.   We discussed diet and exercise extensively.   Diet: Eats "well" per patient. Uses very little salt on her food. Eats fruits and vegetables. Patient reports that she is not needing furosemide regularly. Denies swelling or weight gain. Patient denies dizziness, hypotension or lightheadedness. Patient is working to stand slowly to avoid falls.   Plan  Continue current medications   Hyperlipidemia   LDL goal < 70  Last lipids Lab Results  Component Value Date   CHOL 125 09/17/2020   HDL 36 (L) 09/17/2020   LDLCALC 69 09/17/2020   TRIG 108 09/17/2020   CHOLHDL 3.5 09/17/2020   Hepatic Function Latest Ref Rng & Units 09/17/2020 06/14/2020 03/13/2020  Total Protein 6.0 - 8.5 g/dL 6.6 7.3 6.5  Albumin 3.6 - 4.6 g/dL 3.8 4.1 3.9  AST 0 - 40 IU/L 17 52(H) 29  ALT 0 - 32 IU/L 21 48(H) 28  Alk Phosphatase 44 - 121 IU/L 94 68 72  Total Bilirubin 0.0 - 1.2 mg/dL 0.3 0.4 0.3     The ASCVD Risk score ([place]., et al., 2013) failed to calculate for the following reasons:   The 2013 ASCVD risk score is only valid for ages 32 to 62   Patient has failed these meds in past: none reported Patient is currently controlled on the following medications:  . aspirin ec 81 mg daily  . Rosuvastatin 5 mg daily  . Omega-3 fatty acids daily for  cholesterol  We discussed:  diet and exercise extensively. Patient's home health agency sent a fax stating patient was not taking rosuvastatin. Patient and granddaughter indicate she is taking daily in her pill box. Patient scheduled for updated labs at next visit 12/24/2020 with Dr. Tobie Poet.   Plan  Continue current medications   Hypothyroidism   Lab Results  Component Value Date/Time   TSH 1.930 03/13/2020 11:35 AM    Patient has failed these meds in past: none reported Patient is currently controlled on the following medications:  . levothyroxine 50 mcg daily 1/2-1 hour before eating  We discussed:  Patient reports taking before all other medications and food. Denies problems at this time.   Plan  Continue current medications   GERD   Patient has failed these meds in past: none reported Patient is currently uncontrolled on the following medications:  . Dicyclomine 10 mg four times daily - before meals and at bedtime prn . Pantoprazole 40 mg daily - not taking currently/doesn't have at home per patient   We discussed:   Reports using dicyclomine 1-2 times each week. Only takes prn. Patient denies rarely experiences symptoms of indigestion. Discussed eating small meals and elevating head of bed for symptom management. Patient's granddaughter states she had a hernia and an ulcer in mid November. Patient does not have and states she is not taking pantoprazole. Pharmacist discussing with Dr. Tobie Poet. Patient states she has not seen a GI doctor but states she had irritation when they went down her throat for a scan during November 2021 hospitalization. Recommended 8-12 weeks of pantoprazole therapy.   Plan  Confirm with Dr. Tobie Poet about continuation of pantoprazole therapy.    Fibromyalgia   Patient has failed these meds in past: none reported Patient is currently controlled on the following medications:  . tramadol 50 mg every 12 hours prn   . Savella 100 mg bid  . Celecoxib  200 mg  daily  . Acetaminophen 650 mg every 4 hours prn   We discussed:  Patient takes tramadol and then tylenol for knee pain. She reports that she is taking tramadol maybe 3 days each week. Denies sleepiness or lightheadedness with taking tramadol. Reports that pain management strategy seems to be working well for patient.   Plan  Continue current medications.    Urge Incontinence   Patient has failed these meds in past: none reported Patient is currently uncontrolled on the following medications:  . Vesicare 10 mg daily   We discussed: Patient reports getting up 5-6 during the night to go to the bathroom. She reports this is interrupting her sleep. States her falls are related to her knee giving away. Discussed using caution standing and when going to the bathroom multiple times each night.   Plan  Consider alternative treatment for urge incontinence like Myrbetriq.    Osteopenia / Osteoporosis   Last DEXA Scan:  3/21   T-Score femoral neck: -1.2  T-Score lumbar spine: -0.5  10-year probability of major osteoporotic fracture: 12%  10-year probability of hip fracture: 2.6%  Vit D, 25-Hydroxy  Date Value Ref Range Status  10/25/2020 36.5 30.0 - 100.0 ng/mL Final    Comment:    Vitamin D deficiency has been defined by the [place] and an Endocrine Society practice guideline as a level of serum 25-OH vitamin D less than 20 ng/mL (1,2). The Endocrine Society went on to further define vitamin D insufficiency as a level between 21 and 29 ng/mL (2). 1. IOM (Institute of Medicine). 2010. Dietary reference    intakes for calcium and D. [place]: The    [employer]. 2. Holick MF, [place], Bischoff-Ferrari HA, et al.    Evaluation, treatment, and prevention of vitamin D    deficiency: an Endocrine Society clinical practice    guideline. JCEM. 2011 Jul; 96(7):1911-30.      Patient is not a candidate for pharmacologic treatment  Patient has failed  these meds in past: none reported Patient is currently controlled on the following medications:  . Calcium with D 500/200 mg/unit 2 tablets daily for osteoporosis  We discussed:  Recommend [phone] units of vitamin D daily. Recommend 1200 mg of calcium daily from dietary and supplemental sources. Recommend weight-bearing and muscle strengthening exercises for building and maintaining bone density.   Patient reports currently out of calcium supplement. Recommend resuming calcium twice daily as soon as she can. Patient reports liking green leafy vegetables. Patient is not preparing her own meals.   Plan  Continue current medications  Health Maintenance   Patient is currently controlled on the following medications:  Marland Kitchen Miralax 17 grams daily prn - constipation . Meclizine 25 mg tid prn dizziness . Vitamin B-12 1000 mcg daily - supplementation . Biotin 1000 mcg daily - supplementation . Multiple Vitamin daily - supplementation . Voltaren Gel 1% bid prn - knee pain  . Proctozone 2.5% rectal cream every 6 hours prn hemorrhoids or anal itching   We discussed:  Patient denies concerns with constipation since using Miralax prn.   Plan  Continue current medications  Vaccines   Reviewed and discussed patient's vaccination history.  Patient needs third dose of Moderna - discussed getting in office. Pharmacist coordinating third shot at clinic date 12/19/2020.   Immunization History  Administered Date(s) Administered  . Fluad Quad(high Dose 65+) 07/23/2020  . Influenza-Unspecified 09/09/2019  . Moderna Sars-Covid-2 Vaccination 01/06/2020, 02/08/2020  . Pneumococcal  Conjugate-13 08/31/2015  . Pneumococcal Polysaccharide-23 08/16/2013  . Tdap 04/20/2014  . Zoster Recombinat (Shingrix) 05/27/2018, 10/26/2018    Plan  Recommended patient receive COVID booster vaccine in office.   Medication Management   Patient's preferred pharmacy is:  [facility], [address] [place] [address] Phone: [phone] Fax: [phone]  Uses pill box? Yes Pt endorses good compliance - occasionally missing a dose of medication if she has an early appointment.   We discussed: Discussed benefits of medication synchronization, packaging and delivery as well as enhanced pharmacist oversight with [facility]. Patient prefers to stick with local pharmacy she can pick up medications from. States her granddaughter or family member helps her pick up medicaitons.   Plan  Continue current medication management strategy    Follow up: 4 month phone visit

## 2020-12-12 ENCOUNTER — Other Ambulatory Visit: Payer: Self-pay

## 2020-12-12 ENCOUNTER — Ambulatory Visit (INDEPENDENT_AMBULATORY_CARE_PROVIDER_SITE_OTHER): Payer: Medicare Other

## 2020-12-12 DIAGNOSIS — M797 Fibromyalgia: Secondary | ICD-10-CM | POA: Diagnosis not present

## 2020-12-12 DIAGNOSIS — I2510 Atherosclerotic heart disease of native coronary artery without angina pectoris: Secondary | ICD-10-CM | POA: Diagnosis not present

## 2020-12-12 DIAGNOSIS — E782 Mixed hyperlipidemia: Secondary | ICD-10-CM | POA: Diagnosis not present

## 2020-12-12 DIAGNOSIS — M1991 Primary osteoarthritis, unspecified site: Secondary | ICD-10-CM | POA: Diagnosis not present

## 2020-12-12 DIAGNOSIS — Z9181 History of falling: Secondary | ICD-10-CM | POA: Diagnosis not present

## 2020-12-12 DIAGNOSIS — E1151 Type 2 diabetes mellitus with diabetic peripheral angiopathy without gangrene: Secondary | ICD-10-CM | POA: Diagnosis not present

## 2020-12-12 DIAGNOSIS — E1140 Type 2 diabetes mellitus with diabetic neuropathy, unspecified: Secondary | ICD-10-CM | POA: Diagnosis not present

## 2020-12-12 DIAGNOSIS — D649 Anemia, unspecified: Secondary | ICD-10-CM | POA: Diagnosis not present

## 2020-12-12 DIAGNOSIS — R339 Retention of urine, unspecified: Secondary | ICD-10-CM | POA: Diagnosis not present

## 2020-12-12 DIAGNOSIS — E1142 Type 2 diabetes mellitus with diabetic polyneuropathy: Secondary | ICD-10-CM | POA: Diagnosis not present

## 2020-12-12 DIAGNOSIS — M810 Age-related osteoporosis without current pathological fracture: Secondary | ICD-10-CM | POA: Diagnosis not present

## 2020-12-12 DIAGNOSIS — H9190 Unspecified hearing loss, unspecified ear: Secondary | ICD-10-CM | POA: Diagnosis not present

## 2020-12-12 DIAGNOSIS — Z7984 Long term (current) use of oral hypoglycemic drugs: Secondary | ICD-10-CM | POA: Diagnosis not present

## 2020-12-12 DIAGNOSIS — M1711 Unilateral primary osteoarthritis, right knee: Secondary | ICD-10-CM | POA: Diagnosis not present

## 2020-12-12 DIAGNOSIS — K5900 Constipation, unspecified: Secondary | ICD-10-CM | POA: Diagnosis not present

## 2020-12-12 DIAGNOSIS — K219 Gastro-esophageal reflux disease without esophagitis: Secondary | ICD-10-CM | POA: Diagnosis not present

## 2020-12-12 DIAGNOSIS — K589 Irritable bowel syndrome without diarrhea: Secondary | ICD-10-CM | POA: Diagnosis not present

## 2020-12-12 DIAGNOSIS — R3915 Urgency of urination: Secondary | ICD-10-CM | POA: Diagnosis not present

## 2020-12-12 DIAGNOSIS — M519 Unspecified thoracic, thoracolumbar and lumbosacral intervertebral disc disorder: Secondary | ICD-10-CM | POA: Diagnosis not present

## 2020-12-12 DIAGNOSIS — M792 Neuralgia and neuritis, unspecified: Secondary | ICD-10-CM | POA: Diagnosis not present

## 2020-12-12 DIAGNOSIS — N3281 Overactive bladder: Secondary | ICD-10-CM | POA: Diagnosis not present

## 2020-12-12 DIAGNOSIS — R1314 Dysphagia, pharyngoesophageal phase: Secondary | ICD-10-CM | POA: Diagnosis not present

## 2020-12-12 DIAGNOSIS — E785 Hyperlipidemia, unspecified: Secondary | ICD-10-CM | POA: Diagnosis not present

## 2020-12-12 DIAGNOSIS — I10 Essential (primary) hypertension: Secondary | ICD-10-CM | POA: Diagnosis not present

## 2020-12-12 DIAGNOSIS — E039 Hypothyroidism, unspecified: Secondary | ICD-10-CM | POA: Diagnosis not present

## 2020-12-12 DIAGNOSIS — Z87440 Personal history of urinary (tract) infections: Secondary | ICD-10-CM | POA: Diagnosis not present

## 2020-12-12 MED ORDER — ONETOUCH ULTRA VI STRP: ORAL_STRIP | 3 refills | Status: DC

## 2020-12-13 DIAGNOSIS — K589 Irritable bowel syndrome without diarrhea: Secondary | ICD-10-CM | POA: Diagnosis not present

## 2020-12-13 DIAGNOSIS — M810 Age-related osteoporosis without current pathological fracture: Secondary | ICD-10-CM | POA: Diagnosis not present

## 2020-12-13 DIAGNOSIS — R1314 Dysphagia, pharyngoesophageal phase: Secondary | ICD-10-CM | POA: Diagnosis not present

## 2020-12-13 DIAGNOSIS — N3281 Overactive bladder: Secondary | ICD-10-CM | POA: Diagnosis not present

## 2020-12-13 DIAGNOSIS — E1151 Type 2 diabetes mellitus with diabetic peripheral angiopathy without gangrene: Secondary | ICD-10-CM | POA: Diagnosis not present

## 2020-12-13 DIAGNOSIS — R339 Retention of urine, unspecified: Secondary | ICD-10-CM | POA: Diagnosis not present

## 2020-12-13 DIAGNOSIS — I10 Essential (primary) hypertension: Secondary | ICD-10-CM | POA: Diagnosis not present

## 2020-12-13 DIAGNOSIS — K219 Gastro-esophageal reflux disease without esophagitis: Secondary | ICD-10-CM | POA: Diagnosis not present

## 2020-12-13 DIAGNOSIS — I2510 Atherosclerotic heart disease of native coronary artery without angina pectoris: Secondary | ICD-10-CM | POA: Diagnosis not present

## 2020-12-13 DIAGNOSIS — E785 Hyperlipidemia, unspecified: Secondary | ICD-10-CM | POA: Diagnosis not present

## 2020-12-13 DIAGNOSIS — Z9181 History of falling: Secondary | ICD-10-CM | POA: Diagnosis not present

## 2020-12-13 DIAGNOSIS — R3915 Urgency of urination: Secondary | ICD-10-CM | POA: Diagnosis not present

## 2020-12-13 DIAGNOSIS — M797 Fibromyalgia: Secondary | ICD-10-CM | POA: Diagnosis not present

## 2020-12-13 DIAGNOSIS — K5900 Constipation, unspecified: Secondary | ICD-10-CM | POA: Diagnosis not present

## 2020-12-13 DIAGNOSIS — M1991 Primary osteoarthritis, unspecified site: Secondary | ICD-10-CM | POA: Diagnosis not present

## 2020-12-13 DIAGNOSIS — E1140 Type 2 diabetes mellitus with diabetic neuropathy, unspecified: Secondary | ICD-10-CM | POA: Diagnosis not present

## 2020-12-13 DIAGNOSIS — Z87440 Personal history of urinary (tract) infections: Secondary | ICD-10-CM | POA: Diagnosis not present

## 2020-12-13 DIAGNOSIS — E039 Hypothyroidism, unspecified: Secondary | ICD-10-CM | POA: Diagnosis not present

## 2020-12-13 DIAGNOSIS — Z7984 Long term (current) use of oral hypoglycemic drugs: Secondary | ICD-10-CM | POA: Diagnosis not present

## 2020-12-13 DIAGNOSIS — M792 Neuralgia and neuritis, unspecified: Secondary | ICD-10-CM | POA: Diagnosis not present

## 2020-12-13 DIAGNOSIS — M519 Unspecified thoracic, thoracolumbar and lumbosacral intervertebral disc disorder: Secondary | ICD-10-CM | POA: Diagnosis not present

## 2020-12-13 DIAGNOSIS — H9190 Unspecified hearing loss, unspecified ear: Secondary | ICD-10-CM | POA: Diagnosis not present

## 2020-12-13 DIAGNOSIS — D649 Anemia, unspecified: Secondary | ICD-10-CM | POA: Diagnosis not present

## 2020-12-13 NOTE — Patient Instructions (Addendum)
Visit Information  Thank you for your time discussing your medications. I look forward to working with you to achieve your health care goals. Below is a summary of what we talked about during our visit.   Goals Addressed            This Visit's Progress   . Pharmacy Care [place]       CARE PLAN ENTRY (see longitudinal plan of care for additional care plan information)  Current Barriers:  . Chronic Disease Management support, education, and care coordination needs related to Hypertension, Hyperlipidemia, and Diabetes   Hypertension BP Readings from Last 3 Encounters:  10/25/20 118/62  09/17/20 128/64  06/19/20 118/62   . Pharmacist Clinical Goal(s): o Over the next 90 days, patient will work with PharmD and providers to maintain BP goal <130/80 . Current regimen:   Amlodipine 2.5 mg daily   Furosemide 40 mg daily prn edema  Losartan 50 mg daily  . Interventions: o Discussed healthy diet of lean protein, low salt, fruit and vegetables.  . Patient self care activities - Over the next 90 days, patient will: o Check BP weekly, document, and provide at future appointments o Ensure daily salt intake < 2300 mg/day  Hyperlipidemia Lab Results  Component Value Date/Time   LDLCALC 69 09/17/2020 10:14 AM   . Pharmacist Clinical Goal(s): o Over the next 90 days, patient will work with PharmD and providers to maintain LDL goal < 70 . Current regimen:  . aspirin ec 81 mg daily  . Rosuvastatin 5 mg daily  . Omega-3 fatty acids daily for cholesterol . Interventions: o Discussed taking medication daily as prescribed.  o Encouraged completing physical therapy exercises daily to avoid falls and stay active.  . Patient self care activities - Over the next 90 days, patient will: o Continue to take medication as prescribed.   Diabetes Lab Results  Component Value Date/Time   HGBA1C 6.7 (H) 09/17/2020 10:14 AM   HGBA1C 7.1 (H) 06/14/2020 11:32 AM   . Pharmacist Clinical  Goal(s): o Over the next 90 days, patient will work with PharmD and providers to maintain A1c goal <7% . Current regimen:   One touch ultra mini daily - doesn't seem to be getting test strips  Metformin 500 mg daily with breakfast . Interventions: o Coordinated refill of test strips with local pharmacy.  o Discussed healthy diet of lean protein, fruits and vegetables.  o Encouraged patient to continue daily exercises as prescribed by physical therapy.  . Patient self care activities - Over the next 90 days, patient will: o Check blood sugar once daily, document, and provide at future appointments o Contact provider with any episodes of hypoglycemia  Medication management . Pharmacist Clinical Goal(s): o Over the next 90 days, patient will work with PharmD and providers to achieve optimal medication adherence . Current pharmacy: [facility]  . Interventions o Comprehensive medication review performed. o Continue current medication management strategy . Patient self care activities - Over the next 90 days, patient will: o Focus on medication adherence by using pill box.  o Take medications as prescribed o Report any questions or concerns to PharmD and/or provider(s)  Initial goal documentation        Cindy Gordon was given information about Chronic Care Management services today including:  1. CCM service includes personalized support from designated clinical staff supervised by her physician, including individualized plan of care and coordination with other care providers 2. 24/7 contact phone numbers for assistance  for urgent and routine care needs. 3. Standard insurance, coinsurance, copays and deductibles apply for chronic care management only during months in which we provide at least 20 minutes of these services. Most insurances cover these services at 100%, however patients may be responsible for any copay, coinsurance and/or deductible if applicable. This service may  help you avoid the need for more expensive face-to-face services. 4. Only one practitioner may furnish and bill the service in a calendar month. 5. The patient may stop CCM services at any time (effective at the end of the month) by phone call to the office staff.  Patient agreed to services and verbal consent obtained.   The patient verbalized understanding of instructions, educational materials, and care plan provided today and agreed to receive a mailed copy of patient instructions, educational materials, and care plan.  Telephone follow up appointment with pharmacy team member scheduled for: 04/2021  Sherre Poot, PharmD Clinical Pharmacist [facility] [phone] (office) [phone] (mobile)  Fall Prevention in the Home, Adult Falls can cause injuries and can happen to people of all ages. There are many things you can do to make your home safe and to help prevent falls. Ask for help when making these changes. What actions can I take to prevent falls? General Instructions  Use good lighting in all rooms. Replace any light bulbs that burn out.  Turn on the lights in dark areas. Use night-lights.  Keep items that you use often in easy-to-reach places. Lower the shelves around your home if needed.  Set up your furniture so you have a clear path. Avoid moving your furniture around.  Do not have throw rugs or other things on the floor that can make you trip.  Avoid walking on wet floors.  If any of your floors are uneven, fix them.  Add color or contrast paint or tape to clearly mark and help you see: ? Grab bars or handrails. ? First and last steps of staircases. ? Where the edge of each step is.  If you use a stepladder: ? Make sure that it is fully opened. Do not climb a closed stepladder. ? Make sure the sides of the stepladder are locked in place. ? Ask someone to hold the stepladder while you use it.  Know where your pets are when moving through your  home. What can I do in the bathroom?  Keep the floor dry. Clean up any water on the floor right away.  Remove soap buildup in the tub or shower.  Use nonskid mats or decals on the floor of the tub or shower.  Attach bath mats securely with double-sided, nonslip rug tape.  If you need to sit down in the shower, use a plastic, nonslip stool.  Install grab bars by the toilet and in the tub and shower. Do not use towel bars as grab bars.      What can I do in the bedroom?  Make sure that you have a light by your bed that is easy to reach.  Do not use any sheets or blankets for your bed that hang to the floor.  Have a firm chair with side arms that you can use for support when you get dressed. What can I do in the kitchen?  Clean up any spills right away.  If you need to reach something above you, use a step stool with a grab bar.  Keep electrical cords out of the way.  Do not use floor polish  or wax that makes floors slippery. What can I do with my stairs?  Do not leave any items on the stairs.  Make sure that you have a light switch at the top and the bottom of the stairs.  Make sure that there are handrails on both sides of the stairs. Fix handrails that are broken or loose.  Install nonslip stair treads on all your stairs.  Avoid having throw rugs at the top or bottom of the stairs.  Choose a carpet that does not hide the edge of the steps on the stairs.  Check carpeting to make sure that it is firmly attached to the stairs. Fix carpet that is loose or worn. What can I do on the outside of my home?  Use bright outdoor lighting.  Fix the edges of walkways and driveways and fix any cracks.  Remove anything that might make you trip as you walk through a door, such as a raised step or threshold.  Trim any bushes or trees on paths to your home.  Check to see if handrails are loose or broken and that both sides of all steps have handrails.  Install guardrails along  the edges of any raised decks and porches.  Clear paths of anything that can make you trip, such as tools or rocks.  Have leaves, snow, or ice cleared regularly.  Use sand or salt on paths during winter.  Clean up any spills in your garage right away. This includes grease or oil spills. What other actions can I take?  Wear shoes that: ? Have a low heel. Do not wear high heels. ? Have rubber bottoms. ? Feel good on your feet and fit well. ? Are closed at the toe. Do not wear open-toe sandals.  Use tools that help you move around if needed. These include: ? Canes. ? Walkers. ? Scooters. ? Crutches.  Review your medicines with your doctor. Some medicines can make you feel dizzy. This can increase your chance of falling. Ask your doctor what else you can do to help prevent falls. Where to find more information  Centers for Disease Control and Prevention, STEADI: http://www.wolf.info/  National Institute on Aging: http://kim-miller.com/ Contact a doctor if:  You are afraid of falling at home.  You feel weak, drowsy, or dizzy at home.  You fall at home. Summary  There are many simple things that you can do to make your home safe and to help prevent falls.  Ways to make your home safe include removing things that can make you trip and installing grab bars in the bathroom.  Ask for help when making these changes in your home. This information is not intended to replace advice given to you by your health care provider. Make sure you discuss any questions you have with your health care provider. Document Revised: 05/30/2020 Document Reviewed: 05/30/2020 Elsevier Patient Education  [place].

## 2020-12-14 DIAGNOSIS — R3915 Urgency of urination: Secondary | ICD-10-CM | POA: Diagnosis not present

## 2020-12-14 DIAGNOSIS — K5900 Constipation, unspecified: Secondary | ICD-10-CM | POA: Diagnosis not present

## 2020-12-14 DIAGNOSIS — D649 Anemia, unspecified: Secondary | ICD-10-CM | POA: Diagnosis not present

## 2020-12-14 DIAGNOSIS — Z87440 Personal history of urinary (tract) infections: Secondary | ICD-10-CM | POA: Diagnosis not present

## 2020-12-14 DIAGNOSIS — Z9181 History of falling: Secondary | ICD-10-CM | POA: Diagnosis not present

## 2020-12-14 DIAGNOSIS — E039 Hypothyroidism, unspecified: Secondary | ICD-10-CM | POA: Diagnosis not present

## 2020-12-14 DIAGNOSIS — M1991 Primary osteoarthritis, unspecified site: Secondary | ICD-10-CM | POA: Diagnosis not present

## 2020-12-14 DIAGNOSIS — H9190 Unspecified hearing loss, unspecified ear: Secondary | ICD-10-CM | POA: Diagnosis not present

## 2020-12-14 DIAGNOSIS — M792 Neuralgia and neuritis, unspecified: Secondary | ICD-10-CM | POA: Diagnosis not present

## 2020-12-14 DIAGNOSIS — E1140 Type 2 diabetes mellitus with diabetic neuropathy, unspecified: Secondary | ICD-10-CM | POA: Diagnosis not present

## 2020-12-14 DIAGNOSIS — N3281 Overactive bladder: Secondary | ICD-10-CM | POA: Diagnosis not present

## 2020-12-14 DIAGNOSIS — R339 Retention of urine, unspecified: Secondary | ICD-10-CM | POA: Diagnosis not present

## 2020-12-14 DIAGNOSIS — K219 Gastro-esophageal reflux disease without esophagitis: Secondary | ICD-10-CM | POA: Diagnosis not present

## 2020-12-14 DIAGNOSIS — M797 Fibromyalgia: Secondary | ICD-10-CM | POA: Diagnosis not present

## 2020-12-14 DIAGNOSIS — R1314 Dysphagia, pharyngoesophageal phase: Secondary | ICD-10-CM | POA: Diagnosis not present

## 2020-12-14 DIAGNOSIS — I10 Essential (primary) hypertension: Secondary | ICD-10-CM | POA: Diagnosis not present

## 2020-12-14 DIAGNOSIS — Z7984 Long term (current) use of oral hypoglycemic drugs: Secondary | ICD-10-CM | POA: Diagnosis not present

## 2020-12-14 DIAGNOSIS — I2510 Atherosclerotic heart disease of native coronary artery without angina pectoris: Secondary | ICD-10-CM | POA: Diagnosis not present

## 2020-12-14 DIAGNOSIS — E1151 Type 2 diabetes mellitus with diabetic peripheral angiopathy without gangrene: Secondary | ICD-10-CM | POA: Diagnosis not present

## 2020-12-14 DIAGNOSIS — M810 Age-related osteoporosis without current pathological fracture: Secondary | ICD-10-CM | POA: Diagnosis not present

## 2020-12-14 DIAGNOSIS — E785 Hyperlipidemia, unspecified: Secondary | ICD-10-CM | POA: Diagnosis not present

## 2020-12-14 DIAGNOSIS — M519 Unspecified thoracic, thoracolumbar and lumbosacral intervertebral disc disorder: Secondary | ICD-10-CM | POA: Diagnosis not present

## 2020-12-14 DIAGNOSIS — K589 Irritable bowel syndrome without diarrhea: Secondary | ICD-10-CM | POA: Diagnosis not present

## 2020-12-17 ENCOUNTER — Other Ambulatory Visit: Payer: Self-pay | Admitting: Family Medicine

## 2020-12-17 MED ORDER — PANTOPRAZOLE SODIUM 40 MG PO TBEC: 40.0000 mg | DELAYED_RELEASE_TABLET | Freq: Every day | ORAL | 0 refills | Status: DC

## 2020-12-17 MED ORDER — ONETOUCH ULTRA VI STRP: ORAL_STRIP | 3 refills | Status: DC

## 2020-12-18 ENCOUNTER — Telehealth: Payer: Self-pay

## 2020-12-18 ENCOUNTER — Ambulatory Visit: Payer: Medicare Other

## 2020-12-18 DIAGNOSIS — E785 Hyperlipidemia, unspecified: Secondary | ICD-10-CM | POA: Diagnosis not present

## 2020-12-18 DIAGNOSIS — Z9181 History of falling: Secondary | ICD-10-CM | POA: Diagnosis not present

## 2020-12-18 DIAGNOSIS — E039 Hypothyroidism, unspecified: Secondary | ICD-10-CM | POA: Diagnosis not present

## 2020-12-18 DIAGNOSIS — D649 Anemia, unspecified: Secondary | ICD-10-CM | POA: Diagnosis not present

## 2020-12-18 DIAGNOSIS — E1140 Type 2 diabetes mellitus with diabetic neuropathy, unspecified: Secondary | ICD-10-CM | POA: Diagnosis not present

## 2020-12-18 DIAGNOSIS — M519 Unspecified thoracic, thoracolumbar and lumbosacral intervertebral disc disorder: Secondary | ICD-10-CM | POA: Diagnosis not present

## 2020-12-18 DIAGNOSIS — M792 Neuralgia and neuritis, unspecified: Secondary | ICD-10-CM | POA: Diagnosis not present

## 2020-12-18 DIAGNOSIS — I10 Essential (primary) hypertension: Secondary | ICD-10-CM | POA: Diagnosis not present

## 2020-12-18 DIAGNOSIS — M1991 Primary osteoarthritis, unspecified site: Secondary | ICD-10-CM | POA: Diagnosis not present

## 2020-12-18 DIAGNOSIS — N3281 Overactive bladder: Secondary | ICD-10-CM | POA: Diagnosis not present

## 2020-12-18 DIAGNOSIS — I2510 Atherosclerotic heart disease of native coronary artery without angina pectoris: Secondary | ICD-10-CM | POA: Diagnosis not present

## 2020-12-18 DIAGNOSIS — K219 Gastro-esophageal reflux disease without esophagitis: Secondary | ICD-10-CM | POA: Diagnosis not present

## 2020-12-18 DIAGNOSIS — H9190 Unspecified hearing loss, unspecified ear: Secondary | ICD-10-CM | POA: Diagnosis not present

## 2020-12-18 DIAGNOSIS — Z87440 Personal history of urinary (tract) infections: Secondary | ICD-10-CM | POA: Diagnosis not present

## 2020-12-18 DIAGNOSIS — Z7984 Long term (current) use of oral hypoglycemic drugs: Secondary | ICD-10-CM | POA: Diagnosis not present

## 2020-12-18 DIAGNOSIS — M797 Fibromyalgia: Secondary | ICD-10-CM | POA: Diagnosis not present

## 2020-12-18 DIAGNOSIS — R3915 Urgency of urination: Secondary | ICD-10-CM | POA: Diagnosis not present

## 2020-12-18 DIAGNOSIS — R1314 Dysphagia, pharyngoesophageal phase: Secondary | ICD-10-CM | POA: Diagnosis not present

## 2020-12-18 DIAGNOSIS — R339 Retention of urine, unspecified: Secondary | ICD-10-CM | POA: Diagnosis not present

## 2020-12-18 DIAGNOSIS — M810 Age-related osteoporosis without current pathological fracture: Secondary | ICD-10-CM | POA: Diagnosis not present

## 2020-12-18 DIAGNOSIS — K589 Irritable bowel syndrome without diarrhea: Secondary | ICD-10-CM | POA: Diagnosis not present

## 2020-12-18 DIAGNOSIS — E1151 Type 2 diabetes mellitus with diabetic peripheral angiopathy without gangrene: Secondary | ICD-10-CM | POA: Diagnosis not present

## 2020-12-18 DIAGNOSIS — K5900 Constipation, unspecified: Secondary | ICD-10-CM | POA: Diagnosis not present

## 2020-12-18 NOTE — Progress Notes (Signed)
Chronic Care Management Pharmacy Assistant   Name: Cindy Gordon  MRN: [id number] DOB: [birth year]  Reason for Encounter: Medication Review to restart Pantoprazole and test strip order     PCP : Rochel Brome, MD  Allergies:   Allergies  Allergen Reactions  . Contrast Media [Iodinated Diagnostic Agents] Other (See Comments)    Cardiac arrest  . Hydrocodone Other (See Comments)    Hallucinations  . Codeine   . Crestor [Rosuvastatin] Other (See Comments)    Myalgia   . Lisinopril   . Naproxen     constipation  . Niaspan [Niacin] Other (See Comments)    flushing  . Penicillins     Medications: Outpatient Encounter Medications as of 12/18/2020  Medication Sig  . acetaminophen (TYLENOL) 325 MG tablet Take 650 mg by mouth every 4 (four) hours as needed.  Marland Kitchen amLODipine (NORVASC) 2.5 MG tablet TAKE ONE TABLET BY MOUTH EVERY DAY  . aspirin EC 81 MG tablet Take 81 mg by mouth daily. Swallow whole.  . Biotin 1000 MCG tablet Take 1,000 mcg by mouth daily.  . Blood Glucose Monitoring Suppl (ONE TOUCH ULTRA MINI) w/Device KIT Use daily to check blood sugar levels. E11.42  . calcium-vitamin D (OSCAL WITH D) 500-200 MG-UNIT tablet Take 2 tablets by mouth daily. For osteoporosis. (Patient not taking: Reported on 12/12/2020)  . celecoxib (CELEBREX) 200 MG capsule TAKE ONE CAPSULE BY MOUTH EVERY DAY  . cholecalciferol (VITAMIN D3) 25 MCG (1000 UNIT) tablet Take 2,000 Units by mouth daily.  . diclofenac Sodium (VOLTAREN) 1 % GEL Apply 1 application topically 2 (two) times daily.  Marland Kitchen dicyclomine (BENTYL) 10 MG capsule Take 10 mg by mouth 3 (three) times daily as needed.  . furosemide (LASIX) 40 MG tablet Take 40 mg by mouth daily as needed for edema. (Patient not taking: Reported on 12/12/2020)  . glucose blood (ONETOUCH ULTRA) test strip CHECK BLOOD SUGAR EVERY DAY  . hydrocortisone (ANUSOL-HC) 2.5 % rectal cream Place 1 application rectally every 6 (six) hours as needed for hemorrhoids or anal  itching. (Patient not taking: Reported on 12/12/2020)  . levothyroxine (SYNTHROID) 50 MCG tablet TAKE ONE TABLET BY MOUTH EVERY MORNING 30 MIN-1 HOUR BEFORE EATING WITH WATER  . losartan (COZAAR) 50 MG tablet Take 1 tablet (50 mg total) by mouth daily. (Patient taking differently: Take 50 mg by mouth at bedtime.)  . meclizine (ANTIVERT) 25 MG tablet Take 1 tablet by mouth 3 (three) times daily as needed for dizziness.  . metFORMIN (GLUCOPHAGE) 500 MG tablet Take 1 tablet (500 mg total) by mouth daily with breakfast.  . Milnacipran HCl (SAVELLA) 100 MG TABS tablet Take 1 tablet (100 mg total) by mouth 2 (two) times daily.  . Multiple Vitamin (MULTIVITAMIN) tablet Take 1 tablet by mouth daily.  . Omega-3 Fatty Acids (FISH OIL PO) Take 1 capsule by mouth daily. For cholesterol  . pantoprazole (PROTONIX) 40 MG tablet Take 1 tablet (40 mg total) by mouth daily.  . polyethylene glycol (MIRALAX / GLYCOLAX) 17 g packet Take 17 g by mouth daily. (Patient taking differently: Take 17 g by mouth daily as needed.)  . pregabalin (LYRICA) 300 MG capsule TAKE ONE CAPSULE BY MOUTH 2 TIMES A DAY  . rosuvastatin (CRESTOR) 5 MG tablet TAKE ONE TABLET BY MOUTH EVERY DAY  . solifenacin (VESICARE) 10 MG tablet Take 1 tablet (10 mg total) by mouth daily.  . traMADol (ULTRAM) 50 MG tablet Take 50 mg by mouth every 12 (twelve) hours  as needed.  . vitamin B-12 (CYANOCOBALAMIN) 1000 MCG tablet Take 1,000 mcg by mouth daily.   No facility-administered encounter medications on file as of 12/18/2020.    Current Diagnosis: Patient Active Problem List   Diagnosis Date Noted  . Urge incontinence 07/23/2020  . Mixed hyperlipidemia 03/15/2020  . Controlled type 2 diabetes mellitus with diabetic polyneuropathy, without long-term current use of insulin ([place]) 03/15/2020  . Atrophy of thyroid 03/15/2020  . Fibromyalgia 03/15/2020  . Gastroesophageal reflux disease without esophagitis 03/15/2020  . OSA (obstructive sleep apnea)  03/15/2020  . Class 1 obesity due to excess calories with serious comorbidity and body mass index (BMI) of 32.0 to 32.9 in adult 03/15/2020  . Essential hypertension, benign 01/23/2020  . Acute cystitis without hematuria 01/23/2020  . Vertigo 01/23/2020  . Fall on same level 01/23/2020  . Abrasion of right arm 01/23/2020   Called patient to let her know to restart Pantoprazole 40 mg daily and her test strip script had been sent over to pharmacy.   Follow-Up:  Pharmacist Review  Donette Larry, CPP notified  Clarita Leber, [facility] Pharmacist Assistant [phone]

## 2020-12-19 ENCOUNTER — Encounter: Payer: Self-pay | Admitting: Family Medicine

## 2020-12-19 ENCOUNTER — Other Ambulatory Visit: Payer: Self-pay | Admitting: Family Medicine

## 2020-12-19 ENCOUNTER — Telehealth (INDEPENDENT_AMBULATORY_CARE_PROVIDER_SITE_OTHER): Payer: Medicare Other | Admitting: Family Medicine

## 2020-12-19 ENCOUNTER — Telehealth: Payer: Self-pay

## 2020-12-19 VITALS — BP 130/63 | Temp 97.6°F

## 2020-12-19 DIAGNOSIS — R339 Retention of urine, unspecified: Secondary | ICD-10-CM | POA: Diagnosis not present

## 2020-12-19 DIAGNOSIS — M519 Unspecified thoracic, thoracolumbar and lumbosacral intervertebral disc disorder: Secondary | ICD-10-CM | POA: Diagnosis not present

## 2020-12-19 DIAGNOSIS — Z9181 History of falling: Secondary | ICD-10-CM | POA: Diagnosis not present

## 2020-12-19 DIAGNOSIS — E039 Hypothyroidism, unspecified: Secondary | ICD-10-CM | POA: Diagnosis not present

## 2020-12-19 DIAGNOSIS — J020 Streptococcal pharyngitis: Secondary | ICD-10-CM | POA: Diagnosis not present

## 2020-12-19 DIAGNOSIS — R1314 Dysphagia, pharyngoesophageal phase: Secondary | ICD-10-CM | POA: Diagnosis not present

## 2020-12-19 DIAGNOSIS — K589 Irritable bowel syndrome without diarrhea: Secondary | ICD-10-CM | POA: Diagnosis not present

## 2020-12-19 DIAGNOSIS — M810 Age-related osteoporosis without current pathological fracture: Secondary | ICD-10-CM | POA: Diagnosis not present

## 2020-12-19 DIAGNOSIS — M1991 Primary osteoarthritis, unspecified site: Secondary | ICD-10-CM | POA: Diagnosis not present

## 2020-12-19 DIAGNOSIS — M797 Fibromyalgia: Secondary | ICD-10-CM | POA: Diagnosis not present

## 2020-12-19 DIAGNOSIS — E1151 Type 2 diabetes mellitus with diabetic peripheral angiopathy without gangrene: Secondary | ICD-10-CM | POA: Diagnosis not present

## 2020-12-19 DIAGNOSIS — I10 Essential (primary) hypertension: Secondary | ICD-10-CM | POA: Diagnosis not present

## 2020-12-19 DIAGNOSIS — K219 Gastro-esophageal reflux disease without esophagitis: Secondary | ICD-10-CM | POA: Diagnosis not present

## 2020-12-19 DIAGNOSIS — E1140 Type 2 diabetes mellitus with diabetic neuropathy, unspecified: Secondary | ICD-10-CM | POA: Diagnosis not present

## 2020-12-19 DIAGNOSIS — Z87440 Personal history of urinary (tract) infections: Secondary | ICD-10-CM | POA: Diagnosis not present

## 2020-12-19 DIAGNOSIS — R3915 Urgency of urination: Secondary | ICD-10-CM | POA: Diagnosis not present

## 2020-12-19 DIAGNOSIS — N3281 Overactive bladder: Secondary | ICD-10-CM | POA: Diagnosis not present

## 2020-12-19 DIAGNOSIS — I2510 Atherosclerotic heart disease of native coronary artery without angina pectoris: Secondary | ICD-10-CM | POA: Diagnosis not present

## 2020-12-19 DIAGNOSIS — Z7984 Long term (current) use of oral hypoglycemic drugs: Secondary | ICD-10-CM | POA: Diagnosis not present

## 2020-12-19 DIAGNOSIS — D649 Anemia, unspecified: Secondary | ICD-10-CM | POA: Diagnosis not present

## 2020-12-19 DIAGNOSIS — K5900 Constipation, unspecified: Secondary | ICD-10-CM | POA: Diagnosis not present

## 2020-12-19 DIAGNOSIS — H9190 Unspecified hearing loss, unspecified ear: Secondary | ICD-10-CM | POA: Diagnosis not present

## 2020-12-19 DIAGNOSIS — E785 Hyperlipidemia, unspecified: Secondary | ICD-10-CM | POA: Diagnosis not present

## 2020-12-19 DIAGNOSIS — M792 Neuralgia and neuritis, unspecified: Secondary | ICD-10-CM | POA: Diagnosis not present

## 2020-12-19 MED ORDER — CEFDINIR 300 MG PO CAPS: 300.0000 mg | ORAL_CAPSULE | Freq: Two times a day (BID) | ORAL | 0 refills | Status: DC

## 2020-12-19 NOTE — Telephone Encounter (Signed)
Appt complete. Kc

## 2020-12-19 NOTE — Telephone Encounter (Signed)
Morey Hummingbird from [facility] calling about pt. She states pt is complaining of cough, voice hoarseness, and sore throat. She has had no known exposures but a cousin who she has been around does have similar symptoms that started a couple days before pt. Pt symptoms started on Saturday. Nurse states lungs are clear and oxygen sats are 97 on RA. Nurse is asking for any recommendations for medications for symptoms. Nurse already told pt to increase fluids and pt understood.

## 2020-12-19 NOTE — Progress Notes (Signed)
Virtual Visit via Telephone Note   This visit type was conducted due to national recommendations for restrictions regarding the COVID-19 Pandemic (e.g. social distancing) in an effort to limit this patient's exposure and mitigate transmission in our community.  Due to her co-morbid illnesses, this patient is at least at moderate risk for complications without adequate follow up.  This format is felt to be most appropriate for this patient at this time.  The patient did not have access to video technology/had technical difficulties with video requiring transitioning to audio format only (telephone).  All issues noted in this document were discussed and addressed.  No physical exam could be performed with this format.  Patient verbally consented to a telehealth visit.   Date:  12/19/2020   ID:  Cindy Gordon, DOB 10/18/1941, MRN [id number]  Patient Location: Home Provider Location: Office/Clinic  PCP:  Rochel Brome, MD   Evaluation Performed: acute visit Chief Complaint:  cough  History of Present Illness:    Cindy Gordon is a 80 y.o. female complaining of cough (NP), sore throat, and nasal congestion. Some chills. Began Saturday. Was exposed to her cousin, who had similar sxs last Thursday. Denies fever, sweats, EA, SOB, CP. Using robitussin.  Her grand daughter is staying with her.   The patient does have symptoms concerning for COVID-19 infection (fever, chills, cough, or new shortness of breath). Has had 2 covid 19 vaccinations.    Past Medical History:  Diagnosis Date  . Atrophy of thyroid (acquired)   . Carpal tunnel syndrome   . Chronic back pain   . Diabetes mellitus without complication ([place])   . Fibromyalgia   . Gastro-esophageal reflux disease without esophagitis   . Hyperlipidemia   . Hypertension   . Irritable bowel syndrome   . Localized edema   . Localized swelling, mass and lump, neck   . Neuromuscular dysfunction of bladder, unspecified   . Obstructive sleep  apnea (adult) (pediatric)   . Other obesity   . Other specified hypothyroidism   . Sciatica, right side   . Urge incontinence   . Vitamin B 12 deficiency   . Vitamin D deficiency     Past Surgical History:  Procedure Laterality Date  . APPENDECTOMY    . BLADDER SUSPENSION    . BREAST BIOPSY     right breast: benign  . carpel tunnel repair Left 08/2014  . CATARACT EXTRACTION  2019   right   . CHOLECYSTECTOMY OPEN  1980   acute panceatitis:requiring removal of stones from pancreatitic duct  . dobutamine nuclear stress test     12/2010  . HYSTERECTOMY ABDOMINAL WITH SALPINGO-OOPHORECTOMY    . LUMBAR LAMINECTOMY    . RECTOCELE REPAIR  02/2011  . REVERSE TOTAL SHOULDER ARTHROPLASTY  07/2017  . TOTAL SHOULDER REPLACEMENT  2002   secondary to staph infection  . VEIN LIGATION AND STRIPPING      Family History  Problem Relation Age of Onset  . Alzheimer's disease Mother   . CVA Mother   . Alzheimer's disease Sister   . Diabetes Sister   . Hypertension Sister   . Hypothyroidism Sister   . Congenital heart disease Sister   . Prostate cancer Son   . Coronary artery disease Daughter   . Peptic Ulcer Other   . Diabetes type II Other   . Cystic fibrosis Grandson     Social History   Socioeconomic History  . Marital status: Widowed    Spouse name: Not on file  .  Number of children: 3  . Years of education: Not on file  . Highest education level: Not on file  Occupational History  . Not on file  Tobacco Use  . Smoking status: Never Smoker  . Smokeless tobacco: Never Used  Vaping Use  . Vaping Use: Never used  Substance and Sexual Activity  . Alcohol use: Never  . Drug use: Never  . Sexual activity: Not on file  Other Topics Concern  . Not on file  Social History Narrative  . Not on file   Social Determinants of Health   Financial Resource Strain: Not on file  Food Insecurity: No Food Insecurity  . Worried About Charity fundraiser in the Last Year: Never true   . Ran Out of Food in the Last Year: Never true  Transportation Needs: Unknown  . Lack of Transportation (Medical): Not on file  . Lack of Transportation (Non-Medical): No  Physical Activity: Not on file  Stress: Not on file  Social Connections: Not on file  Intimate Partner Violence: Not on file    Outpatient Medications Prior to Visit  Medication Sig Dispense Refill  . acetaminophen (TYLENOL) 325 MG tablet Take 650 mg by mouth every 4 (four) hours as needed.    Marland Kitchen amLODipine (NORVASC) 2.5 MG tablet TAKE ONE TABLET BY MOUTH EVERY DAY 90 tablet 1  . aspirin EC 81 MG tablet Take 81 mg by mouth daily. Swallow whole.    . Biotin 1000 MCG tablet Take 1,000 mcg by mouth daily.    . Blood Glucose Monitoring Suppl (ONE TOUCH ULTRA MINI) w/Device KIT Use daily to check blood sugar levels. E11.42 1 kit 0  . calcium-vitamin D (OSCAL WITH D) 500-200 MG-UNIT tablet Take 2 tablets by mouth daily. For osteoporosis. (Patient not taking: Reported on 12/12/2020)    . celecoxib (CELEBREX) 200 MG capsule TAKE ONE CAPSULE BY MOUTH EVERY DAY 90 capsule 1  . cholecalciferol (VITAMIN D3) 25 MCG (1000 UNIT) tablet Take 2,000 Units by mouth daily.    . diclofenac Sodium (VOLTAREN) 1 % GEL Apply 1 application topically 2 (two) times daily.    Marland Kitchen dicyclomine (BENTYL) 10 MG capsule Take 10 mg by mouth 3 (three) times daily as needed.    . furosemide (LASIX) 40 MG tablet Take 40 mg by mouth daily as needed for edema. (Patient not taking: Reported on 12/12/2020)    . glucose blood (ONETOUCH ULTRA) test strip CHECK BLOOD SUGAR EVERY DAY 100 each 3  . hydrocortisone (ANUSOL-HC) 2.5 % rectal cream Place 1 application rectally every 6 (six) hours as needed for hemorrhoids or anal itching. (Patient not taking: Reported on 12/12/2020)    . levothyroxine (SYNTHROID) 50 MCG tablet TAKE ONE TABLET BY MOUTH EVERY MORNING 30 MIN-1 HOUR BEFORE EATING WITH WATER 90 tablet 0  . losartan (COZAAR) 50 MG tablet Take 1 tablet (50 mg total) by  mouth daily. (Patient taking differently: Take 50 mg by mouth at bedtime.) 90 tablet 0  . meclizine (ANTIVERT) 25 MG tablet Take 1 tablet by mouth 3 (three) times daily as needed for dizziness.    . metFORMIN (GLUCOPHAGE) 500 MG tablet Take 1 tablet (500 mg total) by mouth daily with breakfast. 90 tablet 2  . Milnacipran HCl (SAVELLA) 100 MG TABS tablet Take 1 tablet (100 mg total) by mouth 2 (two) times daily. 180 tablet 3  . Multiple Vitamin (MULTIVITAMIN) tablet Take 1 tablet by mouth daily.    . Omega-3 Fatty Acids (FISH OIL  PO) Take 1 capsule by mouth daily. For cholesterol    . pantoprazole (PROTONIX) 40 MG tablet Take 1 tablet (40 mg total) by mouth daily. 90 tablet 0  . polyethylene glycol (MIRALAX / GLYCOLAX) 17 g packet Take 17 g by mouth daily. (Patient taking differently: Take 17 g by mouth daily as needed.) 14 each   . pregabalin (LYRICA) 300 MG capsule TAKE ONE CAPSULE BY MOUTH 2 TIMES A DAY 180 capsule 1  . rosuvastatin (CRESTOR) 5 MG tablet TAKE ONE TABLET BY MOUTH EVERY DAY 90 tablet 0  . solifenacin (VESICARE) 10 MG tablet Take 1 tablet (10 mg total) by mouth daily. 90 tablet 0  . traMADol (ULTRAM) 50 MG tablet Take 50 mg by mouth every 12 (twelve) hours as needed.    . vitamin B-12 (CYANOCOBALAMIN) 1000 MCG tablet Take 1,000 mcg by mouth daily.     No facility-administered medications prior to visit.    Allergies:   Contrast media [iodinated diagnostic agents], Hydrocodone, Codeine, Crestor [rosuvastatin], Lisinopril, Naproxen, Niaspan [niacin], and Penicillins   Social History   Tobacco Use  . Smoking status: Never Smoker  . Smokeless tobacco: Never Used  Vaping Use  . Vaping Use: Never used  Substance Use Topics  . Alcohol use: Never  . Drug use: Never     Review of Systems  Constitutional: Negative for chills, fever and malaise/fatigue.  HENT: Negative for ear pain, sinus pain and sore throat.   Respiratory: Negative for cough and shortness of breath.    Cardiovascular: Negative for chest pain.  Musculoskeletal: Negative for myalgias.  Neurological: Negative for headaches.     Labs/Other Tests and Data Reviewed:    Recent Labs: 03/13/2020: TSH 1.930 09/17/2020: ALT 21; BUN 18; Creatinine, Ser 0.94; Hemoglobin 12.7; Platelets 313; Potassium 4.9; Sodium 138   Recent Lipid Panel Lab Results  Component Value Date/Time   CHOL 125 09/17/2020 10:14 AM   TRIG 108 09/17/2020 10:14 AM   HDL 36 (L) 09/17/2020 10:14 AM   CHOLHDL 3.5 09/17/2020 10:14 AM   LDLCALC 69 09/17/2020 10:14 AM    Wt Readings from Last 3 Encounters:  10/25/20 167 lb (75.8 kg)  09/17/20 170 lb 6.4 oz (77.3 kg)  07/23/20 176 lb (79.8 kg)     Objective:    Vital Signs:  BP 130/63   Temp 97.6 F (36.4 C)   SpO2 97%    Physical Exam Vitals reviewed.    Pt is hoarse. Does not seem sob on phone.   ASSESSMENT & PLAN:   1. Strep pharyngitis Cefdinir rx sent.  On day 5 of sxs and vaccinated with covid 19 x 2. Would not qualify for MAB infusion or medicines.  Gargle warm salt water.  Discussed need to go to ED for worsening sob, turning blue, and sxs of dehydration.   COVID-19 Education: The signs and symptoms of COVID-19 were discussed with the patient and how to seek care for testing (follow up with PCP or arrange E-visit). The importance of social distancing was discussed today.   I spent 12 minutes dedicated to the care of this patient on the date of this encounter.  Follow Up:  In Person prn  Signed,  Rochel Brome, MD  12/19/2020 4:48 PM    [place]

## 2020-12-20 ENCOUNTER — Telehealth: Payer: Self-pay

## 2020-12-20 NOTE — Telephone Encounter (Signed)
PA submitted and approved for tramadol via [email or web address]

## 2020-12-21 ENCOUNTER — Telehealth: Payer: Self-pay

## 2020-12-21 DIAGNOSIS — E1140 Type 2 diabetes mellitus with diabetic neuropathy, unspecified: Secondary | ICD-10-CM | POA: Diagnosis not present

## 2020-12-21 DIAGNOSIS — R1314 Dysphagia, pharyngoesophageal phase: Secondary | ICD-10-CM | POA: Diagnosis not present

## 2020-12-21 DIAGNOSIS — D649 Anemia, unspecified: Secondary | ICD-10-CM | POA: Diagnosis not present

## 2020-12-21 DIAGNOSIS — Z87440 Personal history of urinary (tract) infections: Secondary | ICD-10-CM | POA: Diagnosis not present

## 2020-12-21 DIAGNOSIS — E1151 Type 2 diabetes mellitus with diabetic peripheral angiopathy without gangrene: Secondary | ICD-10-CM | POA: Diagnosis not present

## 2020-12-21 DIAGNOSIS — M519 Unspecified thoracic, thoracolumbar and lumbosacral intervertebral disc disorder: Secondary | ICD-10-CM | POA: Diagnosis not present

## 2020-12-21 DIAGNOSIS — K219 Gastro-esophageal reflux disease without esophagitis: Secondary | ICD-10-CM | POA: Diagnosis not present

## 2020-12-21 DIAGNOSIS — H9190 Unspecified hearing loss, unspecified ear: Secondary | ICD-10-CM | POA: Diagnosis not present

## 2020-12-21 DIAGNOSIS — M797 Fibromyalgia: Secondary | ICD-10-CM | POA: Diagnosis not present

## 2020-12-21 DIAGNOSIS — E785 Hyperlipidemia, unspecified: Secondary | ICD-10-CM | POA: Diagnosis not present

## 2020-12-21 DIAGNOSIS — I10 Essential (primary) hypertension: Secondary | ICD-10-CM | POA: Diagnosis not present

## 2020-12-21 DIAGNOSIS — I2510 Atherosclerotic heart disease of native coronary artery without angina pectoris: Secondary | ICD-10-CM | POA: Diagnosis not present

## 2020-12-21 DIAGNOSIS — R3915 Urgency of urination: Secondary | ICD-10-CM | POA: Diagnosis not present

## 2020-12-21 DIAGNOSIS — N3281 Overactive bladder: Secondary | ICD-10-CM | POA: Diagnosis not present

## 2020-12-21 DIAGNOSIS — R339 Retention of urine, unspecified: Secondary | ICD-10-CM | POA: Diagnosis not present

## 2020-12-21 DIAGNOSIS — Z7984 Long term (current) use of oral hypoglycemic drugs: Secondary | ICD-10-CM | POA: Diagnosis not present

## 2020-12-21 DIAGNOSIS — K5900 Constipation, unspecified: Secondary | ICD-10-CM | POA: Diagnosis not present

## 2020-12-21 DIAGNOSIS — M810 Age-related osteoporosis without current pathological fracture: Secondary | ICD-10-CM | POA: Diagnosis not present

## 2020-12-21 DIAGNOSIS — Z9181 History of falling: Secondary | ICD-10-CM | POA: Diagnosis not present

## 2020-12-21 DIAGNOSIS — E039 Hypothyroidism, unspecified: Secondary | ICD-10-CM | POA: Diagnosis not present

## 2020-12-21 DIAGNOSIS — M1991 Primary osteoarthritis, unspecified site: Secondary | ICD-10-CM | POA: Diagnosis not present

## 2020-12-21 DIAGNOSIS — K589 Irritable bowel syndrome without diarrhea: Secondary | ICD-10-CM | POA: Diagnosis not present

## 2020-12-21 DIAGNOSIS — M792 Neuralgia and neuritis, unspecified: Secondary | ICD-10-CM | POA: Diagnosis not present

## 2020-12-21 NOTE — Telephone Encounter (Signed)
Home Physical Therapy called to report that Cindy Gordon had a fall yesterday getting to the bedside commode.  She had no apparent injury.

## 2020-12-24 ENCOUNTER — Encounter: Payer: Self-pay | Admitting: Family Medicine

## 2020-12-24 ENCOUNTER — Ambulatory Visit (INDEPENDENT_AMBULATORY_CARE_PROVIDER_SITE_OTHER): Payer: Medicare Other | Admitting: Family Medicine

## 2020-12-24 ENCOUNTER — Other Ambulatory Visit: Payer: Self-pay

## 2020-12-24 VITALS — BP 136/68 | HR 72 | Temp 94.3°F | Ht 62.0 in | Wt 172.0 lb

## 2020-12-24 DIAGNOSIS — N39498 Other specified urinary incontinence: Secondary | ICD-10-CM | POA: Diagnosis not present

## 2020-12-24 DIAGNOSIS — N3000 Acute cystitis without hematuria: Secondary | ICD-10-CM | POA: Diagnosis not present

## 2020-12-24 DIAGNOSIS — E782 Mixed hyperlipidemia: Secondary | ICD-10-CM | POA: Diagnosis not present

## 2020-12-24 DIAGNOSIS — K219 Gastro-esophageal reflux disease without esophagitis: Secondary | ICD-10-CM

## 2020-12-24 DIAGNOSIS — E034 Atrophy of thyroid (acquired): Secondary | ICD-10-CM

## 2020-12-24 DIAGNOSIS — E1142 Type 2 diabetes mellitus with diabetic polyneuropathy: Secondary | ICD-10-CM

## 2020-12-24 LAB — POCT URINALYSIS DIP (CLINITEK)
Bilirubin, UA: NEGATIVE
Blood, UA: NEGATIVE
Glucose, UA: NEGATIVE mg/dL
Ketones, POC UA: NEGATIVE mg/dL
Nitrite, UA: NEGATIVE
POC PROTEIN,UA: NEGATIVE
Spec Grav, UA: 1.015
Urobilinogen, UA: 0.2 U/dL
pH, UA: 6.5

## 2020-12-24 MED ORDER — NITROFURANTOIN MONOHYD MACRO 100 MG PO CAPS: 100.0000 mg | ORAL_CAPSULE | Freq: Two times a day (BID) | ORAL | 0 refills | Status: DC

## 2020-12-24 NOTE — Telephone Encounter (Signed)
Reviewed. Kc  

## 2020-12-24 NOTE — Progress Notes (Signed)
Subjective:  Patient ID: Cindy Gordon, female    DOB: [birth year]  Age: 80 y.o. MRN: [id number]  Chief Complaint  Patient presents with  . Hypertension  . Diabetes  . Hyperlipidemia    HPI Controlled type 2 diabetes mellitus with diabetic polyneuropathy, without long-term current use of insulin (HCC) - Sugars 120s. Checks sugars once daily. On losartan 50 mg once daily in am. Metformin 500 mg once daily  Mixed hyperlipidemia: ON omega 3 once capsule daily.   Atrophy of thyroid: on synthroid 50 mcg once daily.  Gastroesophageal reflux disease without esophagitis: on protonix.  Other urinary incontinence: on vesicare 10 mg once daily. Not on myrbetriq daily. Has not seen urology in a long time.   Fibromyalgia: on savella and Lyrica. Baseline. Has some pain. ON tramadol   Strep Pharyngitis: cefdinir rx about 1/2 way through. Symptoms improved.   Current Outpatient Medications on File Prior to Visit  Medication Sig Dispense Refill  . acetaminophen (TYLENOL) 325 MG tablet Take 650 mg by mouth every 4 (four) hours as needed.    Marland Kitchen amLODipine (NORVASC) 2.5 MG tablet TAKE ONE TABLET BY MOUTH EVERY DAY 90 tablet 1  . aspirin EC 81 MG tablet Take 81 mg by mouth daily. Swallow whole.    . Biotin 1000 MCG tablet Take 1,000 mcg by mouth daily.    . Blood Glucose Monitoring Suppl (ONE TOUCH ULTRA MINI) w/Device KIT Use daily to check blood sugar levels. E11.42 1 kit 0  . calcium-vitamin D (OSCAL WITH D) 500-200 MG-UNIT tablet Take 2 tablets by mouth daily. For osteoporosis. (Patient not taking: Reported on 12/12/2020)    . cefdinir (OMNICEF) 300 MG capsule Take 1 capsule (300 mg total) by mouth 2 (two) times daily. 20 capsule 0  . celecoxib (CELEBREX) 200 MG capsule TAKE ONE CAPSULE BY MOUTH EVERY DAY 90 capsule 1  . cholecalciferol (VITAMIN D3) 25 MCG (1000 UNIT) tablet Take 2,000 Units by mouth daily.    . diclofenac Sodium (VOLTAREN) 1 % GEL Apply 1 application topically 2 (two) times  daily.    Marland Kitchen dicyclomine (BENTYL) 10 MG capsule Take 10 mg by mouth 3 (three) times daily as needed.    . furosemide (LASIX) 40 MG tablet Take 40 mg by mouth daily as needed for edema. (Patient not taking: Reported on 12/12/2020)    . glucose blood (ONETOUCH ULTRA) test strip CHECK BLOOD SUGAR EVERY DAY 100 each 3  . hydrocortisone (ANUSOL-HC) 2.5 % rectal cream Place 1 application rectally every 6 (six) hours as needed for hemorrhoids or anal itching. (Patient not taking: Reported on 12/12/2020)    . levothyroxine (SYNTHROID) 50 MCG tablet TAKE ONE TABLET BY MOUTH EVERY MORNING 30 MIN-1 HOUR BEFORE EATING WITH WATER 90 tablet 0  . losartan (COZAAR) 50 MG tablet Take 1 tablet (50 mg total) by mouth daily. (Patient taking differently: Take 50 mg by mouth at bedtime.) 90 tablet 0  . meclizine (ANTIVERT) 25 MG tablet Take 1 tablet by mouth 3 (three) times daily as needed for dizziness.    . metFORMIN (GLUCOPHAGE) 500 MG tablet Take 1 tablet (500 mg total) by mouth daily with breakfast. 90 tablet 2  . Milnacipran HCl (SAVELLA) 100 MG TABS tablet Take 1 tablet (100 mg total) by mouth 2 (two) times daily. 180 tablet 3  . Multiple Vitamin (MULTIVITAMIN) tablet Take 1 tablet by mouth daily.    . Omega-3 Fatty Acids (FISH OIL PO) Take 1 capsule by mouth daily. For cholesterol    .  pantoprazole (PROTONIX) 40 MG tablet Take 1 tablet (40 mg total) by mouth daily. 90 tablet 0  . polyethylene glycol (MIRALAX / GLYCOLAX) 17 g packet Take 17 g by mouth daily. (Patient taking differently: Take 17 g by mouth daily as needed.) 14 each   . pregabalin (LYRICA) 300 MG capsule TAKE ONE CAPSULE BY MOUTH 2 TIMES A DAY 180 capsule 1  . rosuvastatin (CRESTOR) 5 MG tablet TAKE ONE TABLET BY MOUTH EVERY DAY 90 tablet 0  . solifenacin (VESICARE) 10 MG tablet Take 1 tablet (10 mg total) by mouth daily. 90 tablet 0  . traMADol (ULTRAM) 50 MG tablet Take 50 mg by mouth every 12 (twelve) hours as needed.    . vitamin B-12  (CYANOCOBALAMIN) 1000 MCG tablet Take 1,000 mcg by mouth daily.     No current facility-administered medications on file prior to visit.   Past Medical History:  Diagnosis Date  . Atrophy of thyroid (acquired)   . Carpal tunnel syndrome   . Chronic back pain   . Diabetes mellitus without complication ([place])   . Fibromyalgia   . Gastro-esophageal reflux disease without esophagitis   . Hyperlipidemia   . Hypertension   . Irritable bowel syndrome   . Localized edema   . Localized swelling, mass and lump, neck   . Neuromuscular dysfunction of bladder, unspecified   . Obstructive sleep apnea (adult) (pediatric)   . Other obesity   . Other specified hypothyroidism   . Sciatica, right side   . Urge incontinence   . Vitamin B 12 deficiency   . Vitamin D deficiency    Past Surgical History:  Procedure Laterality Date  . APPENDECTOMY    . BLADDER SUSPENSION    . BREAST BIOPSY     right breast: benign  . carpel tunnel repair Left 08/2014  . CATARACT EXTRACTION  2019   right   . CHOLECYSTECTOMY OPEN  1980   acute panceatitis:requiring removal of stones from pancreatitic duct  . dobutamine nuclear stress test     12/2010  . HYSTERECTOMY ABDOMINAL WITH SALPINGO-OOPHORECTOMY    . LUMBAR LAMINECTOMY    . RECTOCELE REPAIR  02/2011  . REVERSE TOTAL SHOULDER ARTHROPLASTY  07/2017  . TOTAL SHOULDER REPLACEMENT  2002   secondary to staph infection  . VEIN LIGATION AND STRIPPING      Family History  Problem Relation Age of Onset  . Alzheimer's disease Mother   . CVA Mother   . Alzheimer's disease Sister   . Diabetes Sister   . Hypertension Sister   . Hypothyroidism Sister   . Congenital heart disease Sister   . Prostate cancer Son   . Coronary artery disease Daughter   . Peptic Ulcer Other   . Diabetes type II Other   . Cystic fibrosis Grandson    Social History   Socioeconomic History  . Marital status: Widowed    Spouse name: Not on file  . Number of children: 3  .  Years of education: Not on file  . Highest education level: Not on file  Occupational History  . Not on file  Tobacco Use  . Smoking status: Never Smoker  . Smokeless tobacco: Never Used  Vaping Use  . Vaping Use: Never used  Substance and Sexual Activity  . Alcohol use: Never  . Drug use: Never  . Sexual activity: Not on file  Other Topics Concern  . Not on file  Social History Narrative  . Not on file  Social Determinants of Health   Financial Resource Strain: Not on file  Food Insecurity: No Food Insecurity  . Worried About Charity fundraiser in the Last Year: Never true  . Ran Out of Food in the Last Year: Never true  Transportation Needs: Unknown  . Lack of Transportation (Medical): Not on file  . Lack of Transportation (Non-Medical): No  Physical Activity: Not on file  Stress: Not on file  Social Connections: Not on file    Review of Systems  Constitutional: Positive for fatigue. Negative for chills and fever.  HENT: Positive for congestion and rhinorrhea. Negative for ear pain and sore throat.   Respiratory: Positive for cough (improved, but still having issues producing phlegm) and shortness of breath.   Cardiovascular: Negative for chest pain.  Gastrointestinal: Negative for abdominal pain, constipation, diarrhea, nausea and vomiting.  Genitourinary: Positive for frequency. Negative for dysuria and urgency.       Urinary incont.  Musculoskeletal: Negative for back pain and myalgias.  Neurological: Negative for dizziness, weakness, light-headedness and headaches.  Psychiatric/Behavioral: Negative for dysphoric mood. The patient is not nervous/anxious.      Objective:  BP 136/68   Pulse 72   Temp (!) 94.3 F (34.6 C)   Ht '5\' 2"'  (1.575 m)   Wt 172 lb (78 kg)   SpO2 92%   BMI 31.46 kg/m   BP/Weight 12/24/2020 12/19/2020 37/29/0211  Systolic BP 155 208 022  Diastolic BP 68 63 62  Wt. (Lbs) 172 - 167  BMI 31.46 - 30.54    Physical Exam Vitals  reviewed.  Constitutional:      Appearance: Normal appearance.  Neck:     Vascular: No carotid bruit.  Cardiovascular:     Rate and Rhythm: Normal rate and regular rhythm.     Pulses: Normal pulses.     Heart sounds: Normal heart sounds.  Pulmonary:     Effort: Pulmonary effort is normal. No respiratory distress.     Breath sounds: Normal breath sounds.  Abdominal:     General: Abdomen is flat. Bowel sounds are normal.     Palpations: Abdomen is soft.     Tenderness: There is no abdominal tenderness.  Neurological:     Mental Status: She is alert.  Psychiatric:        Mood and Affect: Mood normal.        Behavior: Behavior normal.     Diabetic Foot Exam - Simple   Simple Foot Form Diabetic Foot exam was performed with the following findings: Yes 12/24/2020 11:17 PM  Visual Inspection No deformities, no ulcerations, no other skin breakdown bilaterally: Yes Sensation Testing See comments: Yes Pulse Check Posterior Tibialis and Dorsalis pulse intact bilaterally: Yes Comments Decreased sensation on BL feet.       Lab Results  Component Value Date   WBC 6.7 09/17/2020   HGB 12.7 09/17/2020   HCT 38.9 09/17/2020   PLT 313 09/17/2020   GLUCOSE 120 (H) 09/17/2020   CHOL 125 09/17/2020   TRIG 108 09/17/2020   HDL 36 (L) 09/17/2020   LDLCALC 69 09/17/2020   ALT 21 09/17/2020   AST 17 09/17/2020   NA 138 09/17/2020   K 4.9 09/17/2020   CL 100 09/17/2020   CREATININE 0.94 09/17/2020   BUN 18 09/17/2020   CO2 26 09/17/2020   TSH 1.930 03/13/2020   HGBA1C 6.7 (H) 09/17/2020   MICROALBUR 80 09/17/2020      Assessment & Plan:   1.  Controlled type 2 diabetes mellitus with diabetic polyneuropathy, without long-term current use of insulin (HCC) Control: good Recommend check sugars fasting daily. Recommend check feet daily. Recommend annual eye exams. Medicines: continue current medications Continue to work on eating a healthy diet and exercise.  Labs drawn today.    - CBC with Differential/Platelet - Comprehensive metabolic panel - Hemoglobin A1c  2. Mixed hyperlipidemia Well controlled.  No changes to medicines.  Continue to work on eating a healthy diet and exercise.  Labs drawn today.  - Lipid panel  3. Atrophy of thyroid The current medical regimen is effective;  continue present plan and medications. - TSH  4. Gastroesophageal reflux disease without esophagitis The current medical regimen is effective;  continue present plan and medications.  5. Other urinary incontinence Continue vesicare.  - POCT URINALYSIS DIP (CLINITEK) - Ambulatory referral to Urology  6. UTI Start on macrobid      Meds ordered this encounter  Medications  . nitrofurantoin, macrocrystal-monohydrate, (MACROBID) 100 MG capsule    Sig: Take 1 capsule (100 mg total) by mouth 2 (two) times daily.    Dispense:  14 capsule    Refill:  0    Orders Placed This Encounter  Procedures  . CBC with Differential/Platelet  . Comprehensive metabolic panel  . Hemoglobin A1c  . Lipid panel  . TSH  . Ambulatory referral to Urology  . POCT URINALYSIS DIP (CLINITEK)     Follow-up: Return in about 3 months (around 03/23/2021).  An After Visit Summary was printed and given to the patient.  Rochel Brome, MD [facility] [phone]

## 2020-12-25 LAB — COMPREHENSIVE METABOLIC PANEL
ALT: 20 IU/L (ref 0–32)
AST: 25 IU/L (ref 0–40)
Albumin/Globulin Ratio: 1.1 — ABNORMAL LOW (ref 1.2–2.2)
Albumin: 3.6 g/dL (ref 3.6–4.6)
Alkaline Phosphatase: 88 IU/L (ref 44–121)
BUN/Creatinine Ratio: 13 (ref 12–28)
BUN: 12 mg/dL (ref 8–27)
Bilirubin Total: 0.3 mg/dL (ref 0.0–1.2)
CO2: 21 mmol/L (ref 20–29)
Calcium: 9.2 mg/dL (ref 8.7–10.3)
Chloride: 102 mmol/L (ref 96–106)
Creatinine, Ser: 0.94 mg/dL (ref 0.57–1.00)
GFR calc Af Amer: 66 mL/min/{1.73_m2} (ref >59)
GFR calc non Af Amer: 57 mL/min/{1.73_m2} — ABNORMAL LOW (ref >59)
Globulin, Total: 3.2 g/dL (ref 1.5–4.5)
Glucose: 112 mg/dL — ABNORMAL HIGH (ref 65–99)
Potassium: 4.7 mmol/L (ref 3.5–5.2)
Sodium: 139 mmol/L (ref 134–144)
Total Protein: 6.8 g/dL (ref 6.0–8.5)

## 2020-12-25 LAB — CARDIOVASCULAR RISK ASSESSMENT

## 2020-12-25 LAB — TSH: TSH: 3.35 u[IU]/mL (ref 0.450–4.500)

## 2020-12-25 LAB — CBC WITH DIFFERENTIAL/PLATELET
Basophils Absolute: 0 10*3/uL (ref 0.0–0.2)
Basos: 0 %
EOS (ABSOLUTE): 0.1 10*3/uL (ref 0.0–0.4)
Eos: 1 %
Hematocrit: 41.2 % (ref 34.0–46.6)
Hemoglobin: 13.3 g/dL (ref 11.1–15.9)
Immature Grans (Abs): 0.1 10*3/uL (ref 0.0–0.1)
Immature Granulocytes: 1 %
Lymphocytes Absolute: 1.5 10*3/uL (ref 0.7–3.1)
Lymphs: 20 %
MCH: 30.4 pg (ref 26.6–33.0)
MCHC: 32.3 g/dL (ref 31.5–35.7)
MCV: 94 fL (ref 79–97)
Monocytes Absolute: 0.8 10*3/uL (ref 0.1–0.9)
Monocytes: 10 %
Neutrophils Absolute: 5.1 10*3/uL (ref 1.4–7.0)
Neutrophils: 68 %
Platelets: 255 10*3/uL (ref 150–450)
RBC: 4.37 x10E6/uL (ref 3.77–5.28)
RDW: 13.4 % (ref 11.7–15.4)
WBC: 7.5 10*3/uL (ref 3.4–10.8)

## 2020-12-25 LAB — HEMOGLOBIN A1C
Est. average glucose Bld gHb Est-mCnc: 111 mg/dL
Hgb A1c MFr Bld: 5.5 % (ref 4.8–5.6)

## 2020-12-25 LAB — LIPID PANEL
Chol/HDL Ratio: 3 ratio (ref 0.0–4.4)
Cholesterol, Total: 100 mg/dL (ref 100–199)
HDL: 33 mg/dL — ABNORMAL LOW (ref >39)
LDL Chol Calc (NIH): 48 mg/dL (ref 0–99)
Triglycerides: 101 mg/dL (ref 0–149)
VLDL Cholesterol Cal: 19 mg/dL (ref 5–40)

## 2020-12-26 DIAGNOSIS — M792 Neuralgia and neuritis, unspecified: Secondary | ICD-10-CM | POA: Diagnosis not present

## 2020-12-26 DIAGNOSIS — N3281 Overactive bladder: Secondary | ICD-10-CM | POA: Diagnosis not present

## 2020-12-26 DIAGNOSIS — Z87440 Personal history of urinary (tract) infections: Secondary | ICD-10-CM | POA: Diagnosis not present

## 2020-12-26 DIAGNOSIS — K589 Irritable bowel syndrome without diarrhea: Secondary | ICD-10-CM | POA: Diagnosis not present

## 2020-12-26 DIAGNOSIS — E1151 Type 2 diabetes mellitus with diabetic peripheral angiopathy without gangrene: Secondary | ICD-10-CM | POA: Diagnosis not present

## 2020-12-26 DIAGNOSIS — R1314 Dysphagia, pharyngoesophageal phase: Secondary | ICD-10-CM | POA: Diagnosis not present

## 2020-12-26 DIAGNOSIS — R339 Retention of urine, unspecified: Secondary | ICD-10-CM | POA: Diagnosis not present

## 2020-12-26 DIAGNOSIS — K5900 Constipation, unspecified: Secondary | ICD-10-CM | POA: Diagnosis not present

## 2020-12-26 DIAGNOSIS — Z9181 History of falling: Secondary | ICD-10-CM | POA: Diagnosis not present

## 2020-12-26 DIAGNOSIS — H9190 Unspecified hearing loss, unspecified ear: Secondary | ICD-10-CM | POA: Diagnosis not present

## 2020-12-26 DIAGNOSIS — I2510 Atherosclerotic heart disease of native coronary artery without angina pectoris: Secondary | ICD-10-CM | POA: Diagnosis not present

## 2020-12-26 DIAGNOSIS — R3915 Urgency of urination: Secondary | ICD-10-CM | POA: Diagnosis not present

## 2020-12-26 DIAGNOSIS — M797 Fibromyalgia: Secondary | ICD-10-CM | POA: Diagnosis not present

## 2020-12-26 DIAGNOSIS — Z7984 Long term (current) use of oral hypoglycemic drugs: Secondary | ICD-10-CM | POA: Diagnosis not present

## 2020-12-26 DIAGNOSIS — D649 Anemia, unspecified: Secondary | ICD-10-CM | POA: Diagnosis not present

## 2020-12-26 DIAGNOSIS — I10 Essential (primary) hypertension: Secondary | ICD-10-CM | POA: Diagnosis not present

## 2020-12-26 DIAGNOSIS — E1140 Type 2 diabetes mellitus with diabetic neuropathy, unspecified: Secondary | ICD-10-CM | POA: Diagnosis not present

## 2020-12-26 DIAGNOSIS — M519 Unspecified thoracic, thoracolumbar and lumbosacral intervertebral disc disorder: Secondary | ICD-10-CM | POA: Diagnosis not present

## 2020-12-26 DIAGNOSIS — K219 Gastro-esophageal reflux disease without esophagitis: Secondary | ICD-10-CM | POA: Diagnosis not present

## 2020-12-26 DIAGNOSIS — E039 Hypothyroidism, unspecified: Secondary | ICD-10-CM | POA: Diagnosis not present

## 2020-12-26 DIAGNOSIS — M810 Age-related osteoporosis without current pathological fracture: Secondary | ICD-10-CM | POA: Diagnosis not present

## 2020-12-26 DIAGNOSIS — E785 Hyperlipidemia, unspecified: Secondary | ICD-10-CM | POA: Diagnosis not present

## 2020-12-26 DIAGNOSIS — M1991 Primary osteoarthritis, unspecified site: Secondary | ICD-10-CM | POA: Diagnosis not present

## 2020-12-27 DIAGNOSIS — E039 Hypothyroidism, unspecified: Secondary | ICD-10-CM | POA: Diagnosis not present

## 2020-12-27 DIAGNOSIS — N3281 Overactive bladder: Secondary | ICD-10-CM | POA: Diagnosis not present

## 2020-12-27 DIAGNOSIS — K589 Irritable bowel syndrome without diarrhea: Secondary | ICD-10-CM | POA: Diagnosis not present

## 2020-12-27 DIAGNOSIS — R1314 Dysphagia, pharyngoesophageal phase: Secondary | ICD-10-CM | POA: Diagnosis not present

## 2020-12-27 DIAGNOSIS — M519 Unspecified thoracic, thoracolumbar and lumbosacral intervertebral disc disorder: Secondary | ICD-10-CM | POA: Diagnosis not present

## 2020-12-27 DIAGNOSIS — M792 Neuralgia and neuritis, unspecified: Secondary | ICD-10-CM | POA: Diagnosis not present

## 2020-12-27 DIAGNOSIS — I10 Essential (primary) hypertension: Secondary | ICD-10-CM | POA: Diagnosis not present

## 2020-12-27 DIAGNOSIS — M1991 Primary osteoarthritis, unspecified site: Secondary | ICD-10-CM | POA: Diagnosis not present

## 2020-12-27 DIAGNOSIS — Z7984 Long term (current) use of oral hypoglycemic drugs: Secondary | ICD-10-CM | POA: Diagnosis not present

## 2020-12-27 DIAGNOSIS — M810 Age-related osteoporosis without current pathological fracture: Secondary | ICD-10-CM | POA: Diagnosis not present

## 2020-12-27 DIAGNOSIS — E785 Hyperlipidemia, unspecified: Secondary | ICD-10-CM | POA: Diagnosis not present

## 2020-12-27 DIAGNOSIS — R339 Retention of urine, unspecified: Secondary | ICD-10-CM | POA: Diagnosis not present

## 2020-12-27 DIAGNOSIS — I2510 Atherosclerotic heart disease of native coronary artery without angina pectoris: Secondary | ICD-10-CM | POA: Diagnosis not present

## 2020-12-27 DIAGNOSIS — Z87440 Personal history of urinary (tract) infections: Secondary | ICD-10-CM | POA: Diagnosis not present

## 2020-12-27 DIAGNOSIS — K219 Gastro-esophageal reflux disease without esophagitis: Secondary | ICD-10-CM | POA: Diagnosis not present

## 2020-12-27 DIAGNOSIS — K5900 Constipation, unspecified: Secondary | ICD-10-CM | POA: Diagnosis not present

## 2020-12-27 DIAGNOSIS — H9190 Unspecified hearing loss, unspecified ear: Secondary | ICD-10-CM | POA: Diagnosis not present

## 2020-12-27 DIAGNOSIS — D649 Anemia, unspecified: Secondary | ICD-10-CM | POA: Diagnosis not present

## 2020-12-27 DIAGNOSIS — E1151 Type 2 diabetes mellitus with diabetic peripheral angiopathy without gangrene: Secondary | ICD-10-CM | POA: Diagnosis not present

## 2020-12-27 DIAGNOSIS — M797 Fibromyalgia: Secondary | ICD-10-CM | POA: Diagnosis not present

## 2020-12-27 DIAGNOSIS — R3915 Urgency of urination: Secondary | ICD-10-CM | POA: Diagnosis not present

## 2020-12-27 DIAGNOSIS — Z9181 History of falling: Secondary | ICD-10-CM | POA: Diagnosis not present

## 2020-12-27 DIAGNOSIS — E1140 Type 2 diabetes mellitus with diabetic neuropathy, unspecified: Secondary | ICD-10-CM | POA: Diagnosis not present

## 2020-12-28 DIAGNOSIS — M1991 Primary osteoarthritis, unspecified site: Secondary | ICD-10-CM | POA: Diagnosis not present

## 2020-12-28 DIAGNOSIS — Z9181 History of falling: Secondary | ICD-10-CM | POA: Diagnosis not present

## 2020-12-28 DIAGNOSIS — I10 Essential (primary) hypertension: Secondary | ICD-10-CM | POA: Diagnosis not present

## 2020-12-28 DIAGNOSIS — Z87440 Personal history of urinary (tract) infections: Secondary | ICD-10-CM | POA: Diagnosis not present

## 2020-12-28 DIAGNOSIS — E1151 Type 2 diabetes mellitus with diabetic peripheral angiopathy without gangrene: Secondary | ICD-10-CM | POA: Diagnosis not present

## 2020-12-28 DIAGNOSIS — M810 Age-related osteoporosis without current pathological fracture: Secondary | ICD-10-CM | POA: Diagnosis not present

## 2020-12-28 DIAGNOSIS — M792 Neuralgia and neuritis, unspecified: Secondary | ICD-10-CM | POA: Diagnosis not present

## 2020-12-28 DIAGNOSIS — R339 Retention of urine, unspecified: Secondary | ICD-10-CM | POA: Diagnosis not present

## 2020-12-28 DIAGNOSIS — K589 Irritable bowel syndrome without diarrhea: Secondary | ICD-10-CM | POA: Diagnosis not present

## 2020-12-28 DIAGNOSIS — K5900 Constipation, unspecified: Secondary | ICD-10-CM | POA: Diagnosis not present

## 2020-12-28 DIAGNOSIS — Z7984 Long term (current) use of oral hypoglycemic drugs: Secondary | ICD-10-CM | POA: Diagnosis not present

## 2020-12-28 DIAGNOSIS — H9190 Unspecified hearing loss, unspecified ear: Secondary | ICD-10-CM | POA: Diagnosis not present

## 2020-12-28 DIAGNOSIS — D649 Anemia, unspecified: Secondary | ICD-10-CM | POA: Diagnosis not present

## 2020-12-28 DIAGNOSIS — E039 Hypothyroidism, unspecified: Secondary | ICD-10-CM | POA: Diagnosis not present

## 2020-12-28 DIAGNOSIS — K219 Gastro-esophageal reflux disease without esophagitis: Secondary | ICD-10-CM | POA: Diagnosis not present

## 2020-12-28 DIAGNOSIS — E1140 Type 2 diabetes mellitus with diabetic neuropathy, unspecified: Secondary | ICD-10-CM | POA: Diagnosis not present

## 2020-12-28 DIAGNOSIS — M519 Unspecified thoracic, thoracolumbar and lumbosacral intervertebral disc disorder: Secondary | ICD-10-CM | POA: Diagnosis not present

## 2020-12-28 DIAGNOSIS — E785 Hyperlipidemia, unspecified: Secondary | ICD-10-CM | POA: Diagnosis not present

## 2020-12-28 DIAGNOSIS — I2510 Atherosclerotic heart disease of native coronary artery without angina pectoris: Secondary | ICD-10-CM | POA: Diagnosis not present

## 2020-12-28 DIAGNOSIS — R1314 Dysphagia, pharyngoesophageal phase: Secondary | ICD-10-CM | POA: Diagnosis not present

## 2020-12-28 DIAGNOSIS — R3915 Urgency of urination: Secondary | ICD-10-CM | POA: Diagnosis not present

## 2020-12-28 DIAGNOSIS — M797 Fibromyalgia: Secondary | ICD-10-CM | POA: Diagnosis not present

## 2020-12-28 DIAGNOSIS — N3281 Overactive bladder: Secondary | ICD-10-CM | POA: Diagnosis not present

## 2020-12-31 DIAGNOSIS — E1151 Type 2 diabetes mellitus with diabetic peripheral angiopathy without gangrene: Secondary | ICD-10-CM | POA: Diagnosis not present

## 2020-12-31 DIAGNOSIS — E1140 Type 2 diabetes mellitus with diabetic neuropathy, unspecified: Secondary | ICD-10-CM | POA: Diagnosis not present

## 2020-12-31 DIAGNOSIS — I2510 Atherosclerotic heart disease of native coronary artery without angina pectoris: Secondary | ICD-10-CM | POA: Diagnosis not present

## 2020-12-31 DIAGNOSIS — R1314 Dysphagia, pharyngoesophageal phase: Secondary | ICD-10-CM | POA: Diagnosis not present

## 2020-12-31 DIAGNOSIS — Z87440 Personal history of urinary (tract) infections: Secondary | ICD-10-CM | POA: Diagnosis not present

## 2020-12-31 DIAGNOSIS — K219 Gastro-esophageal reflux disease without esophagitis: Secondary | ICD-10-CM | POA: Diagnosis not present

## 2020-12-31 DIAGNOSIS — M797 Fibromyalgia: Secondary | ICD-10-CM | POA: Diagnosis not present

## 2020-12-31 DIAGNOSIS — K589 Irritable bowel syndrome without diarrhea: Secondary | ICD-10-CM | POA: Diagnosis not present

## 2020-12-31 DIAGNOSIS — E039 Hypothyroidism, unspecified: Secondary | ICD-10-CM | POA: Diagnosis not present

## 2020-12-31 DIAGNOSIS — E785 Hyperlipidemia, unspecified: Secondary | ICD-10-CM | POA: Diagnosis not present

## 2020-12-31 DIAGNOSIS — Z9181 History of falling: Secondary | ICD-10-CM | POA: Diagnosis not present

## 2020-12-31 DIAGNOSIS — M519 Unspecified thoracic, thoracolumbar and lumbosacral intervertebral disc disorder: Secondary | ICD-10-CM | POA: Diagnosis not present

## 2020-12-31 DIAGNOSIS — I10 Essential (primary) hypertension: Secondary | ICD-10-CM | POA: Diagnosis not present

## 2020-12-31 DIAGNOSIS — K5900 Constipation, unspecified: Secondary | ICD-10-CM | POA: Diagnosis not present

## 2020-12-31 DIAGNOSIS — H9190 Unspecified hearing loss, unspecified ear: Secondary | ICD-10-CM | POA: Diagnosis not present

## 2020-12-31 DIAGNOSIS — M792 Neuralgia and neuritis, unspecified: Secondary | ICD-10-CM | POA: Diagnosis not present

## 2020-12-31 DIAGNOSIS — M1991 Primary osteoarthritis, unspecified site: Secondary | ICD-10-CM | POA: Diagnosis not present

## 2020-12-31 DIAGNOSIS — N3281 Overactive bladder: Secondary | ICD-10-CM | POA: Diagnosis not present

## 2020-12-31 DIAGNOSIS — D649 Anemia, unspecified: Secondary | ICD-10-CM | POA: Diagnosis not present

## 2020-12-31 DIAGNOSIS — R339 Retention of urine, unspecified: Secondary | ICD-10-CM | POA: Diagnosis not present

## 2020-12-31 DIAGNOSIS — M810 Age-related osteoporosis without current pathological fracture: Secondary | ICD-10-CM | POA: Diagnosis not present

## 2020-12-31 DIAGNOSIS — R3915 Urgency of urination: Secondary | ICD-10-CM | POA: Diagnosis not present

## 2020-12-31 DIAGNOSIS — Z7984 Long term (current) use of oral hypoglycemic drugs: Secondary | ICD-10-CM | POA: Diagnosis not present

## 2021-01-01 ENCOUNTER — Ambulatory Visit (INDEPENDENT_AMBULATORY_CARE_PROVIDER_SITE_OTHER): Payer: Medicare Other

## 2021-01-01 DIAGNOSIS — N3281 Overactive bladder: Secondary | ICD-10-CM | POA: Diagnosis not present

## 2021-01-01 DIAGNOSIS — E039 Hypothyroidism, unspecified: Secondary | ICD-10-CM | POA: Diagnosis not present

## 2021-01-01 DIAGNOSIS — D649 Anemia, unspecified: Secondary | ICD-10-CM | POA: Diagnosis not present

## 2021-01-01 DIAGNOSIS — H9190 Unspecified hearing loss, unspecified ear: Secondary | ICD-10-CM | POA: Diagnosis not present

## 2021-01-01 DIAGNOSIS — Z23 Encounter for immunization: Secondary | ICD-10-CM | POA: Diagnosis not present

## 2021-01-01 DIAGNOSIS — Z9181 History of falling: Secondary | ICD-10-CM | POA: Diagnosis not present

## 2021-01-01 DIAGNOSIS — M1991 Primary osteoarthritis, unspecified site: Secondary | ICD-10-CM | POA: Diagnosis not present

## 2021-01-01 DIAGNOSIS — I2510 Atherosclerotic heart disease of native coronary artery without angina pectoris: Secondary | ICD-10-CM | POA: Diagnosis not present

## 2021-01-01 DIAGNOSIS — K589 Irritable bowel syndrome without diarrhea: Secondary | ICD-10-CM | POA: Diagnosis not present

## 2021-01-01 DIAGNOSIS — E1151 Type 2 diabetes mellitus with diabetic peripheral angiopathy without gangrene: Secondary | ICD-10-CM | POA: Diagnosis not present

## 2021-01-01 DIAGNOSIS — Z87440 Personal history of urinary (tract) infections: Secondary | ICD-10-CM | POA: Diagnosis not present

## 2021-01-01 DIAGNOSIS — M810 Age-related osteoporosis without current pathological fracture: Secondary | ICD-10-CM | POA: Diagnosis not present

## 2021-01-01 DIAGNOSIS — E1140 Type 2 diabetes mellitus with diabetic neuropathy, unspecified: Secondary | ICD-10-CM | POA: Diagnosis not present

## 2021-01-01 DIAGNOSIS — M792 Neuralgia and neuritis, unspecified: Secondary | ICD-10-CM | POA: Diagnosis not present

## 2021-01-01 DIAGNOSIS — M519 Unspecified thoracic, thoracolumbar and lumbosacral intervertebral disc disorder: Secondary | ICD-10-CM | POA: Diagnosis not present

## 2021-01-01 DIAGNOSIS — I10 Essential (primary) hypertension: Secondary | ICD-10-CM | POA: Diagnosis not present

## 2021-01-01 DIAGNOSIS — K219 Gastro-esophageal reflux disease without esophagitis: Secondary | ICD-10-CM | POA: Diagnosis not present

## 2021-01-01 DIAGNOSIS — Z7984 Long term (current) use of oral hypoglycemic drugs: Secondary | ICD-10-CM | POA: Diagnosis not present

## 2021-01-01 DIAGNOSIS — M797 Fibromyalgia: Secondary | ICD-10-CM | POA: Diagnosis not present

## 2021-01-01 DIAGNOSIS — E785 Hyperlipidemia, unspecified: Secondary | ICD-10-CM | POA: Diagnosis not present

## 2021-01-01 DIAGNOSIS — R1314 Dysphagia, pharyngoesophageal phase: Secondary | ICD-10-CM | POA: Diagnosis not present

## 2021-01-01 DIAGNOSIS — R339 Retention of urine, unspecified: Secondary | ICD-10-CM | POA: Diagnosis not present

## 2021-01-01 DIAGNOSIS — K5900 Constipation, unspecified: Secondary | ICD-10-CM | POA: Diagnosis not present

## 2021-01-01 DIAGNOSIS — R3915 Urgency of urination: Secondary | ICD-10-CM | POA: Diagnosis not present

## 2021-01-01 NOTE — Progress Notes (Signed)
   Covid-19 Vaccination Clinic  Name:  Cindy Gordon    MRN: [id number] DOB: [birth year]  01/01/2021  Cindy Gordon was observed post Covid-19 immunization for 15 minutes without incident. She was provided with Vaccine Information Sheet and instruction to access the V-Safe system.   Cindy Gordon was instructed to call 911 with any severe reactions post vaccine: Marland Kitchen Difficulty breathing  . Swelling of face and throat  . A fast heartbeat  . A bad rash all over body  . Dizziness and weakness   Immunizations Administered    Name Date Dose VIS Date Route   Moderna Covid-19 Booster Vaccine 01/01/2021 10:00 AM 0.25 mL 08/29/2020 Intramuscular   Manufacturer: Moderna   Lot: [id number]   [place]: 22300-979-49

## 2021-01-02 ENCOUNTER — Other Ambulatory Visit: Payer: Self-pay | Admitting: Family Medicine

## 2021-01-02 DIAGNOSIS — K589 Irritable bowel syndrome without diarrhea: Secondary | ICD-10-CM | POA: Diagnosis not present

## 2021-01-02 DIAGNOSIS — E785 Hyperlipidemia, unspecified: Secondary | ICD-10-CM | POA: Diagnosis not present

## 2021-01-02 DIAGNOSIS — R1314 Dysphagia, pharyngoesophageal phase: Secondary | ICD-10-CM | POA: Diagnosis not present

## 2021-01-02 DIAGNOSIS — K219 Gastro-esophageal reflux disease without esophagitis: Secondary | ICD-10-CM | POA: Diagnosis not present

## 2021-01-02 DIAGNOSIS — E039 Hypothyroidism, unspecified: Secondary | ICD-10-CM | POA: Diagnosis not present

## 2021-01-02 DIAGNOSIS — M810 Age-related osteoporosis without current pathological fracture: Secondary | ICD-10-CM | POA: Diagnosis not present

## 2021-01-02 DIAGNOSIS — E1151 Type 2 diabetes mellitus with diabetic peripheral angiopathy without gangrene: Secondary | ICD-10-CM | POA: Diagnosis not present

## 2021-01-02 DIAGNOSIS — K5900 Constipation, unspecified: Secondary | ICD-10-CM | POA: Diagnosis not present

## 2021-01-02 DIAGNOSIS — M519 Unspecified thoracic, thoracolumbar and lumbosacral intervertebral disc disorder: Secondary | ICD-10-CM | POA: Diagnosis not present

## 2021-01-02 DIAGNOSIS — M797 Fibromyalgia: Secondary | ICD-10-CM | POA: Diagnosis not present

## 2021-01-02 DIAGNOSIS — D649 Anemia, unspecified: Secondary | ICD-10-CM | POA: Diagnosis not present

## 2021-01-02 DIAGNOSIS — E1140 Type 2 diabetes mellitus with diabetic neuropathy, unspecified: Secondary | ICD-10-CM | POA: Diagnosis not present

## 2021-01-02 DIAGNOSIS — M792 Neuralgia and neuritis, unspecified: Secondary | ICD-10-CM | POA: Diagnosis not present

## 2021-01-02 DIAGNOSIS — R3915 Urgency of urination: Secondary | ICD-10-CM | POA: Diagnosis not present

## 2021-01-02 DIAGNOSIS — N3281 Overactive bladder: Secondary | ICD-10-CM | POA: Diagnosis not present

## 2021-01-02 DIAGNOSIS — Z87440 Personal history of urinary (tract) infections: Secondary | ICD-10-CM | POA: Diagnosis not present

## 2021-01-02 DIAGNOSIS — I2510 Atherosclerotic heart disease of native coronary artery without angina pectoris: Secondary | ICD-10-CM | POA: Diagnosis not present

## 2021-01-02 DIAGNOSIS — Z9181 History of falling: Secondary | ICD-10-CM | POA: Diagnosis not present

## 2021-01-02 DIAGNOSIS — M1991 Primary osteoarthritis, unspecified site: Secondary | ICD-10-CM | POA: Diagnosis not present

## 2021-01-02 DIAGNOSIS — I10 Essential (primary) hypertension: Secondary | ICD-10-CM | POA: Diagnosis not present

## 2021-01-02 DIAGNOSIS — H9190 Unspecified hearing loss, unspecified ear: Secondary | ICD-10-CM | POA: Diagnosis not present

## 2021-01-02 DIAGNOSIS — Z7984 Long term (current) use of oral hypoglycemic drugs: Secondary | ICD-10-CM | POA: Diagnosis not present

## 2021-01-02 DIAGNOSIS — R339 Retention of urine, unspecified: Secondary | ICD-10-CM | POA: Diagnosis not present

## 2021-01-04 DIAGNOSIS — E1151 Type 2 diabetes mellitus with diabetic peripheral angiopathy without gangrene: Secondary | ICD-10-CM | POA: Diagnosis not present

## 2021-01-04 DIAGNOSIS — I2510 Atherosclerotic heart disease of native coronary artery without angina pectoris: Secondary | ICD-10-CM | POA: Diagnosis not present

## 2021-01-04 DIAGNOSIS — R3915 Urgency of urination: Secondary | ICD-10-CM | POA: Diagnosis not present

## 2021-01-04 DIAGNOSIS — N3281 Overactive bladder: Secondary | ICD-10-CM | POA: Diagnosis not present

## 2021-01-04 DIAGNOSIS — I10 Essential (primary) hypertension: Secondary | ICD-10-CM | POA: Diagnosis not present

## 2021-01-04 DIAGNOSIS — Z9181 History of falling: Secondary | ICD-10-CM | POA: Diagnosis not present

## 2021-01-04 DIAGNOSIS — R339 Retention of urine, unspecified: Secondary | ICD-10-CM | POA: Diagnosis not present

## 2021-01-04 DIAGNOSIS — M519 Unspecified thoracic, thoracolumbar and lumbosacral intervertebral disc disorder: Secondary | ICD-10-CM | POA: Diagnosis not present

## 2021-01-04 DIAGNOSIS — E1140 Type 2 diabetes mellitus with diabetic neuropathy, unspecified: Secondary | ICD-10-CM | POA: Diagnosis not present

## 2021-01-04 DIAGNOSIS — E039 Hypothyroidism, unspecified: Secondary | ICD-10-CM | POA: Diagnosis not present

## 2021-01-04 DIAGNOSIS — K219 Gastro-esophageal reflux disease without esophagitis: Secondary | ICD-10-CM | POA: Diagnosis not present

## 2021-01-04 DIAGNOSIS — M792 Neuralgia and neuritis, unspecified: Secondary | ICD-10-CM | POA: Diagnosis not present

## 2021-01-04 DIAGNOSIS — K589 Irritable bowel syndrome without diarrhea: Secondary | ICD-10-CM | POA: Diagnosis not present

## 2021-01-04 DIAGNOSIS — K5900 Constipation, unspecified: Secondary | ICD-10-CM | POA: Diagnosis not present

## 2021-01-04 DIAGNOSIS — Z87440 Personal history of urinary (tract) infections: Secondary | ICD-10-CM | POA: Diagnosis not present

## 2021-01-04 DIAGNOSIS — D649 Anemia, unspecified: Secondary | ICD-10-CM | POA: Diagnosis not present

## 2021-01-04 DIAGNOSIS — E785 Hyperlipidemia, unspecified: Secondary | ICD-10-CM | POA: Diagnosis not present

## 2021-01-04 DIAGNOSIS — R1314 Dysphagia, pharyngoesophageal phase: Secondary | ICD-10-CM | POA: Diagnosis not present

## 2021-01-04 DIAGNOSIS — H9190 Unspecified hearing loss, unspecified ear: Secondary | ICD-10-CM | POA: Diagnosis not present

## 2021-01-04 DIAGNOSIS — M797 Fibromyalgia: Secondary | ICD-10-CM | POA: Diagnosis not present

## 2021-01-04 DIAGNOSIS — M810 Age-related osteoporosis without current pathological fracture: Secondary | ICD-10-CM | POA: Diagnosis not present

## 2021-01-04 DIAGNOSIS — M1991 Primary osteoarthritis, unspecified site: Secondary | ICD-10-CM | POA: Diagnosis not present

## 2021-01-04 DIAGNOSIS — Z7984 Long term (current) use of oral hypoglycemic drugs: Secondary | ICD-10-CM | POA: Diagnosis not present

## 2021-01-07 DIAGNOSIS — N3281 Overactive bladder: Secondary | ICD-10-CM | POA: Diagnosis not present

## 2021-01-07 DIAGNOSIS — K219 Gastro-esophageal reflux disease without esophagitis: Secondary | ICD-10-CM | POA: Diagnosis not present

## 2021-01-07 DIAGNOSIS — H9190 Unspecified hearing loss, unspecified ear: Secondary | ICD-10-CM | POA: Diagnosis not present

## 2021-01-07 DIAGNOSIS — Z87440 Personal history of urinary (tract) infections: Secondary | ICD-10-CM | POA: Diagnosis not present

## 2021-01-07 DIAGNOSIS — K5900 Constipation, unspecified: Secondary | ICD-10-CM | POA: Diagnosis not present

## 2021-01-07 DIAGNOSIS — E039 Hypothyroidism, unspecified: Secondary | ICD-10-CM | POA: Diagnosis not present

## 2021-01-07 DIAGNOSIS — R1314 Dysphagia, pharyngoesophageal phase: Secondary | ICD-10-CM | POA: Diagnosis not present

## 2021-01-07 DIAGNOSIS — E1140 Type 2 diabetes mellitus with diabetic neuropathy, unspecified: Secondary | ICD-10-CM | POA: Diagnosis not present

## 2021-01-07 DIAGNOSIS — R339 Retention of urine, unspecified: Secondary | ICD-10-CM | POA: Diagnosis not present

## 2021-01-07 DIAGNOSIS — R3915 Urgency of urination: Secondary | ICD-10-CM | POA: Diagnosis not present

## 2021-01-07 DIAGNOSIS — E1151 Type 2 diabetes mellitus with diabetic peripheral angiopathy without gangrene: Secondary | ICD-10-CM | POA: Diagnosis not present

## 2021-01-07 DIAGNOSIS — M1991 Primary osteoarthritis, unspecified site: Secondary | ICD-10-CM | POA: Diagnosis not present

## 2021-01-07 DIAGNOSIS — Z7984 Long term (current) use of oral hypoglycemic drugs: Secondary | ICD-10-CM | POA: Diagnosis not present

## 2021-01-07 DIAGNOSIS — Z9181 History of falling: Secondary | ICD-10-CM | POA: Diagnosis not present

## 2021-01-07 DIAGNOSIS — K589 Irritable bowel syndrome without diarrhea: Secondary | ICD-10-CM | POA: Diagnosis not present

## 2021-01-07 DIAGNOSIS — E785 Hyperlipidemia, unspecified: Secondary | ICD-10-CM | POA: Diagnosis not present

## 2021-01-07 DIAGNOSIS — M810 Age-related osteoporosis without current pathological fracture: Secondary | ICD-10-CM | POA: Diagnosis not present

## 2021-01-07 DIAGNOSIS — I10 Essential (primary) hypertension: Secondary | ICD-10-CM | POA: Diagnosis not present

## 2021-01-07 DIAGNOSIS — M519 Unspecified thoracic, thoracolumbar and lumbosacral intervertebral disc disorder: Secondary | ICD-10-CM | POA: Diagnosis not present

## 2021-01-07 DIAGNOSIS — M792 Neuralgia and neuritis, unspecified: Secondary | ICD-10-CM | POA: Diagnosis not present

## 2021-01-07 DIAGNOSIS — D649 Anemia, unspecified: Secondary | ICD-10-CM | POA: Diagnosis not present

## 2021-01-07 DIAGNOSIS — M797 Fibromyalgia: Secondary | ICD-10-CM | POA: Diagnosis not present

## 2021-01-07 DIAGNOSIS — I2510 Atherosclerotic heart disease of native coronary artery without angina pectoris: Secondary | ICD-10-CM | POA: Diagnosis not present

## 2021-01-08 DIAGNOSIS — M810 Age-related osteoporosis without current pathological fracture: Secondary | ICD-10-CM | POA: Diagnosis not present

## 2021-01-08 DIAGNOSIS — K589 Irritable bowel syndrome without diarrhea: Secondary | ICD-10-CM | POA: Diagnosis not present

## 2021-01-08 DIAGNOSIS — I2510 Atherosclerotic heart disease of native coronary artery without angina pectoris: Secondary | ICD-10-CM | POA: Diagnosis not present

## 2021-01-08 DIAGNOSIS — E039 Hypothyroidism, unspecified: Secondary | ICD-10-CM | POA: Diagnosis not present

## 2021-01-08 DIAGNOSIS — M792 Neuralgia and neuritis, unspecified: Secondary | ICD-10-CM | POA: Diagnosis not present

## 2021-01-08 DIAGNOSIS — M797 Fibromyalgia: Secondary | ICD-10-CM | POA: Diagnosis not present

## 2021-01-08 DIAGNOSIS — K5900 Constipation, unspecified: Secondary | ICD-10-CM | POA: Diagnosis not present

## 2021-01-08 DIAGNOSIS — R1314 Dysphagia, pharyngoesophageal phase: Secondary | ICD-10-CM | POA: Diagnosis not present

## 2021-01-08 DIAGNOSIS — H9190 Unspecified hearing loss, unspecified ear: Secondary | ICD-10-CM | POA: Diagnosis not present

## 2021-01-08 DIAGNOSIS — M519 Unspecified thoracic, thoracolumbar and lumbosacral intervertebral disc disorder: Secondary | ICD-10-CM | POA: Diagnosis not present

## 2021-01-08 DIAGNOSIS — E1151 Type 2 diabetes mellitus with diabetic peripheral angiopathy without gangrene: Secondary | ICD-10-CM | POA: Diagnosis not present

## 2021-01-08 DIAGNOSIS — Z9181 History of falling: Secondary | ICD-10-CM | POA: Diagnosis not present

## 2021-01-08 DIAGNOSIS — Z87440 Personal history of urinary (tract) infections: Secondary | ICD-10-CM | POA: Diagnosis not present

## 2021-01-08 DIAGNOSIS — N3281 Overactive bladder: Secondary | ICD-10-CM | POA: Diagnosis not present

## 2021-01-08 DIAGNOSIS — E785 Hyperlipidemia, unspecified: Secondary | ICD-10-CM | POA: Diagnosis not present

## 2021-01-08 DIAGNOSIS — E1140 Type 2 diabetes mellitus with diabetic neuropathy, unspecified: Secondary | ICD-10-CM | POA: Diagnosis not present

## 2021-01-08 DIAGNOSIS — R339 Retention of urine, unspecified: Secondary | ICD-10-CM | POA: Diagnosis not present

## 2021-01-08 DIAGNOSIS — R3915 Urgency of urination: Secondary | ICD-10-CM | POA: Diagnosis not present

## 2021-01-08 DIAGNOSIS — M1991 Primary osteoarthritis, unspecified site: Secondary | ICD-10-CM | POA: Diagnosis not present

## 2021-01-08 DIAGNOSIS — D649 Anemia, unspecified: Secondary | ICD-10-CM | POA: Diagnosis not present

## 2021-01-08 DIAGNOSIS — K219 Gastro-esophageal reflux disease without esophagitis: Secondary | ICD-10-CM | POA: Diagnosis not present

## 2021-01-08 DIAGNOSIS — I10 Essential (primary) hypertension: Secondary | ICD-10-CM | POA: Diagnosis not present

## 2021-01-08 DIAGNOSIS — Z7984 Long term (current) use of oral hypoglycemic drugs: Secondary | ICD-10-CM | POA: Diagnosis not present

## 2021-01-09 DIAGNOSIS — K589 Irritable bowel syndrome without diarrhea: Secondary | ICD-10-CM | POA: Diagnosis not present

## 2021-01-09 DIAGNOSIS — E785 Hyperlipidemia, unspecified: Secondary | ICD-10-CM | POA: Diagnosis not present

## 2021-01-09 DIAGNOSIS — N3281 Overactive bladder: Secondary | ICD-10-CM | POA: Diagnosis not present

## 2021-01-09 DIAGNOSIS — H9190 Unspecified hearing loss, unspecified ear: Secondary | ICD-10-CM | POA: Diagnosis not present

## 2021-01-09 DIAGNOSIS — M810 Age-related osteoporosis without current pathological fracture: Secondary | ICD-10-CM | POA: Diagnosis not present

## 2021-01-09 DIAGNOSIS — Z9181 History of falling: Secondary | ICD-10-CM | POA: Diagnosis not present

## 2021-01-09 DIAGNOSIS — Z87440 Personal history of urinary (tract) infections: Secondary | ICD-10-CM | POA: Diagnosis not present

## 2021-01-09 DIAGNOSIS — E039 Hypothyroidism, unspecified: Secondary | ICD-10-CM | POA: Diagnosis not present

## 2021-01-09 DIAGNOSIS — R3915 Urgency of urination: Secondary | ICD-10-CM | POA: Diagnosis not present

## 2021-01-09 DIAGNOSIS — D649 Anemia, unspecified: Secondary | ICD-10-CM | POA: Diagnosis not present

## 2021-01-09 DIAGNOSIS — K219 Gastro-esophageal reflux disease without esophagitis: Secondary | ICD-10-CM | POA: Diagnosis not present

## 2021-01-09 DIAGNOSIS — M797 Fibromyalgia: Secondary | ICD-10-CM | POA: Diagnosis not present

## 2021-01-09 DIAGNOSIS — R339 Retention of urine, unspecified: Secondary | ICD-10-CM | POA: Diagnosis not present

## 2021-01-09 DIAGNOSIS — Z7984 Long term (current) use of oral hypoglycemic drugs: Secondary | ICD-10-CM | POA: Diagnosis not present

## 2021-01-09 DIAGNOSIS — I10 Essential (primary) hypertension: Secondary | ICD-10-CM | POA: Diagnosis not present

## 2021-01-09 DIAGNOSIS — M519 Unspecified thoracic, thoracolumbar and lumbosacral intervertebral disc disorder: Secondary | ICD-10-CM | POA: Diagnosis not present

## 2021-01-09 DIAGNOSIS — M792 Neuralgia and neuritis, unspecified: Secondary | ICD-10-CM | POA: Diagnosis not present

## 2021-01-09 DIAGNOSIS — R1314 Dysphagia, pharyngoesophageal phase: Secondary | ICD-10-CM | POA: Diagnosis not present

## 2021-01-09 DIAGNOSIS — E1151 Type 2 diabetes mellitus with diabetic peripheral angiopathy without gangrene: Secondary | ICD-10-CM | POA: Diagnosis not present

## 2021-01-09 DIAGNOSIS — K5900 Constipation, unspecified: Secondary | ICD-10-CM | POA: Diagnosis not present

## 2021-01-09 DIAGNOSIS — M1991 Primary osteoarthritis, unspecified site: Secondary | ICD-10-CM | POA: Diagnosis not present

## 2021-01-09 DIAGNOSIS — E1140 Type 2 diabetes mellitus with diabetic neuropathy, unspecified: Secondary | ICD-10-CM | POA: Diagnosis not present

## 2021-01-09 DIAGNOSIS — I2510 Atherosclerotic heart disease of native coronary artery without angina pectoris: Secondary | ICD-10-CM | POA: Diagnosis not present

## 2021-01-14 ENCOUNTER — Telehealth: Payer: Self-pay

## 2021-01-14 NOTE — Progress Notes (Signed)
Chronic Care Management Pharmacy Assistant   Name: Lakeyn Dokken  MRN: [id number] DOB: Jan 26, 1941  Reason for Encounter: Disease State for hypertension   Conditions to be addressed/monitored: HTN    Recent office visits:  12/24/20-PCP  Started [place] consult visits:  none  Hospital visits:  None in previous 6 months  Medications: Outpatient Encounter Medications as of 01/14/2021  Medication Sig  . acetaminophen (TYLENOL) 325 MG tablet Take 650 mg by mouth every 4 (four) hours as needed.  Marland Kitchen amLODipine (NORVASC) 2.5 MG tablet TAKE ONE TABLET BY MOUTH EVERY DAY  . aspirin EC 81 MG tablet Take 81 mg by mouth daily. Swallow whole.  . Biotin 1000 MCG tablet Take 1,000 mcg by mouth daily.  . Blood Glucose Monitoring Suppl (ONE TOUCH ULTRA MINI) w/Device KIT Use daily to check blood sugar levels. E11.42  . calcium-vitamin D (OSCAL WITH D) 500-200 MG-UNIT tablet Take 2 tablets by mouth daily. For osteoporosis. (Patient not taking: Reported on 12/12/2020)  . cefdinir (OMNICEF) 300 MG capsule Take 1 capsule (300 mg total) by mouth 2 (two) times daily.  . celecoxib (CELEBREX) 200 MG capsule TAKE ONE CAPSULE BY MOUTH EVERY DAY  . cholecalciferol (VITAMIN D3) 25 MCG (1000 UNIT) tablet Take 2,000 Units by mouth daily.  . diclofenac Sodium (VOLTAREN) 1 % GEL Apply 1 application topically 2 (two) times daily.  Marland Kitchen dicyclomine (BENTYL) 10 MG capsule Take 10 mg by mouth 3 (three) times daily as needed.  . furosemide (LASIX) 40 MG tablet Take 40 mg by mouth daily as needed for edema. (Patient not taking: Reported on 12/12/2020)  . glucose blood (ONETOUCH ULTRA) test strip CHECK BLOOD SUGAR EVERY DAY  . hydrocortisone (ANUSOL-HC) 2.5 % rectal cream Place 1 application rectally every 6 (six) hours as needed for hemorrhoids or anal itching. (Patient not taking: Reported on 12/12/2020)  . levothyroxine (SYNTHROID) 50 MCG tablet TAKE ONE TABLET BY MOUTH EVERY MORNING 30 MIN-1 HOUR BEFORE EATING  WITH WATER  . losartan (COZAAR) 50 MG tablet Take 1 tablet (50 mg total) by mouth daily. (Patient taking differently: Take 50 mg by mouth at bedtime.)  . meclizine (ANTIVERT) 25 MG tablet Take 1 tablet by mouth 3 (three) times daily as needed for dizziness.  . metFORMIN (GLUCOPHAGE) 500 MG tablet Take 1 tablet (500 mg total) by mouth daily with breakfast.  . Milnacipran HCl (SAVELLA) 100 MG TABS tablet Take 1 tablet (100 mg total) by mouth 2 (two) times daily.  . Multiple Vitamin (MULTIVITAMIN) tablet Take 1 tablet by mouth daily.  . nitrofurantoin, macrocrystal-monohydrate, (MACROBID) 100 MG capsule Take 1 capsule (100 mg total) by mouth 2 (two) times daily.  . Omega-3 Fatty Acids (FISH OIL PO) Take 1 capsule by mouth daily. For cholesterol  . pantoprazole (PROTONIX) 40 MG tablet Take 1 tablet (40 mg total) by mouth daily.  . polyethylene glycol (MIRALAX / GLYCOLAX) 17 g packet Take 17 g by mouth daily. (Patient taking differently: Take 17 g by mouth daily as needed.)  . pregabalin (LYRICA) 300 MG capsule TAKE ONE CAPSULE BY MOUTH 2 TIMES A DAY  . rosuvastatin (CRESTOR) 5 MG tablet TAKE ONE TABLET BY MOUTH EVERY DAY  . solifenacin (VESICARE) 10 MG tablet TAKE ONE TABLET BY MOUTH EVERY DAY  . traMADol (ULTRAM) 50 MG tablet Take 50 mg by mouth every 12 (twelve) hours as needed.  . vitamin B-12 (CYANOCOBALAMIN) 1000 MCG tablet Take 1,000 mcg by mouth daily.   No facility-administered encounter  medications on file as of 01/14/2021.   Reviewed chart prior to disease state call. Spoke with patient regarding BP  Recent Office Vitals: BP Readings from Last 3 Encounters:  12/24/20 136/68  12/19/20 130/63  10/25/20 118/62   Pulse Readings from Last 3 Encounters:  12/24/20 72  10/25/20 81  09/17/20 84    Wt Readings from Last 3 Encounters:  12/24/20 172 lb (78 kg)  10/25/20 167 lb (75.8 kg)  09/17/20 170 lb 6.4 oz (77.3 kg)     Kidney Function Lab Results  Component Value Date/Time    CREATININE 0.94 12/24/2020 10:08 AM   CREATININE 0.94 09/17/2020 10:14 AM   GFRNONAA 57 (L) 12/24/2020 10:08 AM   GFRAA 66 12/24/2020 10:08 AM    BMP Latest Ref Rng & Units 12/24/2020 09/17/2020 06/14/2020  Glucose 65 - 99 mg/dL 112(H) 120(H) 138(H)  BUN 8 - 27 mg/dL _0 Creatinine 0.57 - 1.00 mg/dL 0.94 0.94 0.88  BUN/Creat Ratio 12 - _1 Sodium 134 - 144 mmol/L 139 138 134  Potassium 3.5 - 5.2 mmol/L 4.7 4.9 5.0  Chloride 96 - 106 mmol/L 102 100 94(L)  CO2 20 - 29 mmol/L _2 Calcium 8.7 - 10.3 mg/dL 9.2 9.7 10.2    . Current antihypertensive regimen:   Amlodipine 2.5 mg daily   Furosemide 40 mg daily prn edema  Losartan 50 mg daily   . How often are you checking your Blood Pressure? daily   . Current home BP readings: She has had a home health nurse, she stated she didn't have any numbers, but nurse always states its good.  . What recent interventions/DTPs have been made by any provider to improve Blood Pressure control since last CPP Visit: Patient takes her medication as directed.  . Any recent hospitalizations or ED visits since last visit with CPP? No   . What diet changes have been made to improve Blood Pressure Control?  Patient does not have any special diet.  . What exercise is being done to improve your Blood Pressure Control?  Her activity is limited due to increased knee issues, she had a recent injections but it has not helped.   Per patients daughter, she is sleeping a lot, she is up and down all night with incontinence issues.   Adherence Review: Is the patient currently on ACE/ARB medication? Yes Does the patient have >5 day gap between last estimated fill dates? No    Donette Larry, [place], [place] Pharmacist Assistant [phone]

## 2021-01-23 DIAGNOSIS — M1711 Unilateral primary osteoarthritis, right knee: Secondary | ICD-10-CM | POA: Diagnosis not present

## 2021-01-23 DIAGNOSIS — M25552 Pain in left hip: Secondary | ICD-10-CM | POA: Diagnosis not present

## 2021-01-23 DIAGNOSIS — M25551 Pain in right hip: Secondary | ICD-10-CM | POA: Diagnosis not present

## 2021-01-25 DIAGNOSIS — S80919A Unspecified superficial injury of unspecified knee, initial encounter: Secondary | ICD-10-CM | POA: Diagnosis not present

## 2021-01-25 DIAGNOSIS — N390 Urinary tract infection, site not specified: Secondary | ICD-10-CM | POA: Diagnosis not present

## 2021-01-25 DIAGNOSIS — M79604 Pain in right leg: Secondary | ICD-10-CM | POA: Diagnosis not present

## 2021-01-25 DIAGNOSIS — M25561 Pain in right knee: Secondary | ICD-10-CM | POA: Diagnosis not present

## 2021-01-25 DIAGNOSIS — S0990XA Unspecified injury of head, initial encounter: Secondary | ICD-10-CM | POA: Diagnosis not present

## 2021-01-25 DIAGNOSIS — N1330 Unspecified hydronephrosis: Secondary | ICD-10-CM | POA: Diagnosis not present

## 2021-01-25 DIAGNOSIS — Z043 Encounter for examination and observation following other accident: Secondary | ICD-10-CM | POA: Diagnosis not present

## 2021-01-25 DIAGNOSIS — W19XXXA Unspecified fall, initial encounter: Secondary | ICD-10-CM | POA: Diagnosis not present

## 2021-01-25 DIAGNOSIS — M25512 Pain in left shoulder: Secondary | ICD-10-CM | POA: Diagnosis not present

## 2021-01-25 DIAGNOSIS — R319 Hematuria, unspecified: Secondary | ICD-10-CM | POA: Diagnosis not present

## 2021-01-25 DIAGNOSIS — M25511 Pain in right shoulder: Secondary | ICD-10-CM | POA: Diagnosis not present

## 2021-01-25 DIAGNOSIS — M79661 Pain in right lower leg: Secondary | ICD-10-CM | POA: Diagnosis not present

## 2021-01-25 DIAGNOSIS — R627 Adult failure to thrive: Secondary | ICD-10-CM | POA: Diagnosis not present

## 2021-01-25 DIAGNOSIS — R0902 Hypoxemia: Secondary | ICD-10-CM | POA: Diagnosis not present

## 2021-01-25 DIAGNOSIS — I10 Essential (primary) hypertension: Secondary | ICD-10-CM | POA: Diagnosis not present

## 2021-01-25 DIAGNOSIS — R52 Pain, unspecified: Secondary | ICD-10-CM | POA: Diagnosis not present

## 2021-01-25 DIAGNOSIS — S8001XA Contusion of right knee, initial encounter: Secondary | ICD-10-CM | POA: Diagnosis not present

## 2021-01-26 DIAGNOSIS — M150 Primary generalized (osteo)arthritis: Secondary | ICD-10-CM | POA: Diagnosis not present

## 2021-01-26 DIAGNOSIS — M25561 Pain in right knee: Secondary | ICD-10-CM | POA: Diagnosis not present

## 2021-01-26 DIAGNOSIS — D692 Other nonthrombocytopenic purpura: Secondary | ICD-10-CM | POA: Diagnosis not present

## 2021-01-26 DIAGNOSIS — M25512 Pain in left shoulder: Secondary | ICD-10-CM | POA: Diagnosis not present

## 2021-01-26 DIAGNOSIS — E7849 Other hyperlipidemia: Secondary | ICD-10-CM | POA: Diagnosis not present

## 2021-01-26 DIAGNOSIS — M6281 Muscle weakness (generalized): Secondary | ICD-10-CM | POA: Diagnosis not present

## 2021-01-26 DIAGNOSIS — M797 Fibromyalgia: Secondary | ICD-10-CM | POA: Diagnosis not present

## 2021-01-26 DIAGNOSIS — R627 Adult failure to thrive: Secondary | ICD-10-CM | POA: Diagnosis not present

## 2021-01-26 DIAGNOSIS — S0990XA Unspecified injury of head, initial encounter: Secondary | ICD-10-CM | POA: Diagnosis not present

## 2021-01-26 DIAGNOSIS — E1169 Type 2 diabetes mellitus with other specified complication: Secondary | ICD-10-CM | POA: Diagnosis not present

## 2021-01-26 DIAGNOSIS — Z885 Allergy status to narcotic agent status: Secondary | ICD-10-CM | POA: Diagnosis not present

## 2021-01-26 DIAGNOSIS — I10 Essential (primary) hypertension: Secondary | ICD-10-CM | POA: Diagnosis not present

## 2021-01-26 DIAGNOSIS — D519 Vitamin B12 deficiency anemia, unspecified: Secondary | ICD-10-CM | POA: Diagnosis not present

## 2021-01-26 DIAGNOSIS — Z888 Allergy status to other drugs, medicaments and biological substances status: Secondary | ICD-10-CM | POA: Diagnosis not present

## 2021-01-26 DIAGNOSIS — R531 Weakness: Secondary | ICD-10-CM | POA: Diagnosis not present

## 2021-01-26 DIAGNOSIS — Z043 Encounter for examination and observation following other accident: Secondary | ICD-10-CM | POA: Diagnosis not present

## 2021-01-26 DIAGNOSIS — E119 Type 2 diabetes mellitus without complications: Secondary | ICD-10-CM | POA: Diagnosis not present

## 2021-01-26 DIAGNOSIS — E1151 Type 2 diabetes mellitus with diabetic peripheral angiopathy without gangrene: Secondary | ICD-10-CM | POA: Diagnosis not present

## 2021-01-26 DIAGNOSIS — N390 Urinary tract infection, site not specified: Secondary | ICD-10-CM | POA: Diagnosis not present

## 2021-01-26 DIAGNOSIS — G894 Chronic pain syndrome: Secondary | ICD-10-CM | POA: Diagnosis not present

## 2021-01-26 DIAGNOSIS — W19XXXA Unspecified fall, initial encounter: Secondary | ICD-10-CM | POA: Diagnosis not present

## 2021-01-26 DIAGNOSIS — I700 Atherosclerosis of aorta: Secondary | ICD-10-CM | POA: Diagnosis not present

## 2021-01-26 DIAGNOSIS — I1310 Hypertensive heart and chronic kidney disease without heart failure, with stage 1 through stage 4 chronic kidney disease, or unspecified chronic kidney disease: Secondary | ICD-10-CM | POA: Diagnosis not present

## 2021-01-26 DIAGNOSIS — Z7401 Bed confinement status: Secondary | ICD-10-CM | POA: Diagnosis not present

## 2021-01-26 DIAGNOSIS — Z79899 Other long term (current) drug therapy: Secondary | ICD-10-CM | POA: Diagnosis not present

## 2021-01-26 DIAGNOSIS — Z7982 Long term (current) use of aspirin: Secondary | ICD-10-CM | POA: Diagnosis not present

## 2021-01-26 DIAGNOSIS — M1990 Unspecified osteoarthritis, unspecified site: Secondary | ICD-10-CM | POA: Diagnosis not present

## 2021-01-26 DIAGNOSIS — Z91018 Allergy to other foods: Secondary | ICD-10-CM | POA: Diagnosis not present

## 2021-01-26 DIAGNOSIS — M25511 Pain in right shoulder: Secondary | ICD-10-CM | POA: Diagnosis not present

## 2021-01-26 DIAGNOSIS — K219 Gastro-esophageal reflux disease without esophagitis: Secondary | ICD-10-CM | POA: Diagnosis not present

## 2021-01-26 DIAGNOSIS — Z880 Allergy status to penicillin: Secondary | ICD-10-CM | POA: Diagnosis not present

## 2021-01-26 DIAGNOSIS — E7800 Pure hypercholesterolemia, unspecified: Secondary | ICD-10-CM | POA: Diagnosis not present

## 2021-01-26 DIAGNOSIS — I2510 Atherosclerotic heart disease of native coronary artery without angina pectoris: Secondary | ICD-10-CM | POA: Diagnosis not present

## 2021-01-26 DIAGNOSIS — K5791 Diverticulosis of intestine, part unspecified, without perforation or abscess with bleeding: Secondary | ICD-10-CM | POA: Diagnosis not present

## 2021-01-26 DIAGNOSIS — S8001XA Contusion of right knee, initial encounter: Secondary | ICD-10-CM | POA: Diagnosis not present

## 2021-01-26 DIAGNOSIS — N1330 Unspecified hydronephrosis: Secondary | ICD-10-CM | POA: Diagnosis not present

## 2021-01-26 DIAGNOSIS — M79604 Pain in right leg: Secondary | ICD-10-CM | POA: Diagnosis not present

## 2021-01-26 DIAGNOSIS — E039 Hypothyroidism, unspecified: Secondary | ICD-10-CM | POA: Diagnosis not present

## 2021-01-26 DIAGNOSIS — E1140 Type 2 diabetes mellitus with diabetic neuropathy, unspecified: Secondary | ICD-10-CM | POA: Diagnosis not present

## 2021-01-26 DIAGNOSIS — E1122 Type 2 diabetes mellitus with diabetic chronic kidney disease: Secondary | ICD-10-CM | POA: Diagnosis not present

## 2021-01-26 DIAGNOSIS — R319 Hematuria, unspecified: Secondary | ICD-10-CM | POA: Diagnosis not present

## 2021-01-26 DIAGNOSIS — M79661 Pain in right lower leg: Secondary | ICD-10-CM | POA: Diagnosis not present

## 2021-01-26 DIAGNOSIS — W19XXXD Unspecified fall, subsequent encounter: Secondary | ICD-10-CM | POA: Diagnosis not present

## 2021-01-31 DIAGNOSIS — W19XXXA Unspecified fall, initial encounter: Secondary | ICD-10-CM | POA: Diagnosis not present

## 2021-01-31 DIAGNOSIS — E1122 Type 2 diabetes mellitus with diabetic chronic kidney disease: Secondary | ICD-10-CM | POA: Diagnosis not present

## 2021-01-31 DIAGNOSIS — I1310 Hypertensive heart and chronic kidney disease without heart failure, with stage 1 through stage 4 chronic kidney disease, or unspecified chronic kidney disease: Secondary | ICD-10-CM | POA: Diagnosis not present

## 2021-01-31 DIAGNOSIS — K219 Gastro-esophageal reflux disease without esophagitis: Secondary | ICD-10-CM | POA: Diagnosis not present

## 2021-01-31 DIAGNOSIS — M150 Primary generalized (osteo)arthritis: Secondary | ICD-10-CM | POA: Diagnosis not present

## 2021-01-31 DIAGNOSIS — E1140 Type 2 diabetes mellitus with diabetic neuropathy, unspecified: Secondary | ICD-10-CM | POA: Diagnosis not present

## 2021-01-31 DIAGNOSIS — M1990 Unspecified osteoarthritis, unspecified site: Secondary | ICD-10-CM | POA: Diagnosis not present

## 2021-01-31 DIAGNOSIS — E7849 Other hyperlipidemia: Secondary | ICD-10-CM | POA: Diagnosis not present

## 2021-01-31 DIAGNOSIS — W19XXXD Unspecified fall, subsequent encounter: Secondary | ICD-10-CM | POA: Diagnosis not present

## 2021-01-31 DIAGNOSIS — K5791 Diverticulosis of intestine, part unspecified, without perforation or abscess with bleeding: Secondary | ICD-10-CM | POA: Diagnosis not present

## 2021-01-31 DIAGNOSIS — M797 Fibromyalgia: Secondary | ICD-10-CM | POA: Diagnosis not present

## 2021-01-31 DIAGNOSIS — E1151 Type 2 diabetes mellitus with diabetic peripheral angiopathy without gangrene: Secondary | ICD-10-CM | POA: Diagnosis not present

## 2021-01-31 DIAGNOSIS — M6281 Muscle weakness (generalized): Secondary | ICD-10-CM | POA: Diagnosis not present

## 2021-01-31 DIAGNOSIS — I2510 Atherosclerotic heart disease of native coronary artery without angina pectoris: Secondary | ICD-10-CM | POA: Diagnosis not present

## 2021-01-31 DIAGNOSIS — Z79899 Other long term (current) drug therapy: Secondary | ICD-10-CM | POA: Diagnosis not present

## 2021-01-31 DIAGNOSIS — R300 Dysuria: Secondary | ICD-10-CM | POA: Diagnosis not present

## 2021-01-31 DIAGNOSIS — E039 Hypothyroidism, unspecified: Secondary | ICD-10-CM | POA: Diagnosis not present

## 2021-01-31 DIAGNOSIS — Z7401 Bed confinement status: Secondary | ICD-10-CM | POA: Diagnosis not present

## 2021-01-31 DIAGNOSIS — N390 Urinary tract infection, site not specified: Secondary | ICD-10-CM | POA: Diagnosis not present

## 2021-01-31 DIAGNOSIS — R319 Hematuria, unspecified: Secondary | ICD-10-CM | POA: Diagnosis not present

## 2021-01-31 DIAGNOSIS — Z87440 Personal history of urinary (tract) infections: Secondary | ICD-10-CM | POA: Diagnosis not present

## 2021-01-31 DIAGNOSIS — R0602 Shortness of breath: Secondary | ICD-10-CM | POA: Diagnosis not present

## 2021-01-31 DIAGNOSIS — E1169 Type 2 diabetes mellitus with other specified complication: Secondary | ICD-10-CM | POA: Diagnosis not present

## 2021-01-31 DIAGNOSIS — G894 Chronic pain syndrome: Secondary | ICD-10-CM | POA: Diagnosis not present

## 2021-01-31 DIAGNOSIS — I10 Essential (primary) hypertension: Secondary | ICD-10-CM | POA: Diagnosis not present

## 2021-01-31 DIAGNOSIS — D692 Other nonthrombocytopenic purpura: Secondary | ICD-10-CM | POA: Diagnosis not present

## 2021-01-31 DIAGNOSIS — D519 Vitamin B12 deficiency anemia, unspecified: Secondary | ICD-10-CM | POA: Diagnosis not present

## 2021-01-31 DIAGNOSIS — Z1159 Encounter for screening for other viral diseases: Secondary | ICD-10-CM | POA: Diagnosis not present

## 2021-01-31 DIAGNOSIS — I700 Atherosclerosis of aorta: Secondary | ICD-10-CM | POA: Diagnosis not present

## 2021-01-31 DIAGNOSIS — R531 Weakness: Secondary | ICD-10-CM | POA: Diagnosis not present

## 2021-01-31 DIAGNOSIS — R627 Adult failure to thrive: Secondary | ICD-10-CM | POA: Diagnosis not present

## 2021-01-31 DIAGNOSIS — E119 Type 2 diabetes mellitus without complications: Secondary | ICD-10-CM | POA: Diagnosis not present

## 2021-02-01 DIAGNOSIS — Z79899 Other long term (current) drug therapy: Secondary | ICD-10-CM | POA: Diagnosis not present

## 2021-02-01 DIAGNOSIS — Z1159 Encounter for screening for other viral diseases: Secondary | ICD-10-CM | POA: Diagnosis not present

## 2021-02-04 ENCOUNTER — Telehealth: Payer: Self-pay

## 2021-02-04 DIAGNOSIS — N390 Urinary tract infection, site not specified: Secondary | ICD-10-CM | POA: Diagnosis not present

## 2021-02-04 DIAGNOSIS — Z87440 Personal history of urinary (tract) infections: Secondary | ICD-10-CM | POA: Diagnosis not present

## 2021-02-04 NOTE — Progress Notes (Signed)
Chronic Care Management Pharmacy Assistant   Name: Cindy Gordon  MRN: [id number] DOB: [birth year]   Reason for Encounter: Adherence review     Medications: Outpatient Encounter Medications as of 02/04/2021  Medication Sig  . acetaminophen (TYLENOL) 325 MG tablet Take 650 mg by mouth every 4 (four) hours as needed.  Marland Kitchen amLODipine (NORVASC) 2.5 MG tablet TAKE ONE TABLET BY MOUTH EVERY DAY  . aspirin EC 81 MG tablet Take 81 mg by mouth daily. Swallow whole.  . Biotin 1000 MCG tablet Take 1,000 mcg by mouth daily.  . Blood Glucose Monitoring Suppl (ONE TOUCH ULTRA MINI) w/Device KIT Use daily to check blood sugar levels. E11.42  . calcium-vitamin D (OSCAL WITH D) 500-200 MG-UNIT tablet Take 2 tablets by mouth daily. For osteoporosis. (Patient not taking: Reported on 12/12/2020)  . cefdinir (OMNICEF) 300 MG capsule Take 1 capsule (300 mg total) by mouth 2 (two) times daily.  . celecoxib (CELEBREX) 200 MG capsule TAKE ONE CAPSULE BY MOUTH EVERY DAY  . cholecalciferol (VITAMIN D3) 25 MCG (1000 UNIT) tablet Take 2,000 Units by mouth daily.  . diclofenac Sodium (VOLTAREN) 1 % GEL Apply 1 application topically 2 (two) times daily.  Marland Kitchen dicyclomine (BENTYL) 10 MG capsule Take 10 mg by mouth 3 (three) times daily as needed.  . furosemide (LASIX) 40 MG tablet Take 40 mg by mouth daily as needed for edema. (Patient not taking: Reported on 12/12/2020)  . glucose blood (ONETOUCH ULTRA) test strip CHECK BLOOD SUGAR EVERY DAY  . hydrocortisone (ANUSOL-HC) 2.5 % rectal cream Place 1 application rectally every 6 (six) hours as needed for hemorrhoids or anal itching. (Patient not taking: Reported on 12/12/2020)  . levothyroxine (SYNTHROID) 50 MCG tablet TAKE ONE TABLET BY MOUTH EVERY MORNING 30 MIN-1 HOUR BEFORE EATING WITH WATER  . losartan (COZAAR) 50 MG tablet Take 1 tablet (50 mg total) by mouth daily. (Patient taking differently: Take 50 mg by mouth at bedtime.)  . meclizine (ANTIVERT) 25 MG tablet Take 1  tablet by mouth 3 (three) times daily as needed for dizziness.  . metFORMIN (GLUCOPHAGE) 500 MG tablet Take 1 tablet (500 mg total) by mouth daily with breakfast.  . Milnacipran HCl (SAVELLA) 100 MG TABS tablet Take 1 tablet (100 mg total) by mouth 2 (two) times daily.  . Multiple Vitamin (MULTIVITAMIN) tablet Take 1 tablet by mouth daily.  . nitrofurantoin, macrocrystal-monohydrate, (MACROBID) 100 MG capsule Take 1 capsule (100 mg total) by mouth 2 (two) times daily.  . Omega-3 Fatty Acids (FISH OIL PO) Take 1 capsule by mouth daily. For cholesterol  . pantoprazole (PROTONIX) 40 MG tablet Take 1 tablet (40 mg total) by mouth daily.  . polyethylene glycol (MIRALAX / GLYCOLAX) 17 g packet Take 17 g by mouth daily. (Patient taking differently: Take 17 g by mouth daily as needed.)  . pregabalin (LYRICA) 300 MG capsule TAKE ONE CAPSULE BY MOUTH 2 TIMES A DAY  . rosuvastatin (CRESTOR) 5 MG tablet TAKE ONE TABLET BY MOUTH EVERY DAY  . solifenacin (VESICARE) 10 MG tablet TAKE ONE TABLET BY MOUTH EVERY DAY  . traMADol (ULTRAM) 50 MG tablet Take 50 mg by mouth every 12 (twelve) hours as needed.  . vitamin B-12 (CYANOCOBALAMIN) 1000 MCG tablet Take 1,000 mcg by mouth daily.   No facility-administered encounter medications on file as of 02/04/2021.    Chart review noted the patient has appointments on: 04/05/21-Dr. Cox, PCP 04/12/21-Sara Owens Shark, CPP  Medication Adherence review Not available  Clarita Leber,  [place] Clinical Pharmacist Assistant [phone]

## 2021-02-06 ENCOUNTER — Telehealth: Payer: Self-pay

## 2021-02-06 NOTE — Progress Notes (Signed)
    Chronic Care Management Pharmacy Assistant   Name: Cindy Gordon  MRN: [id number] DOB: [birth year]    Reason for Encounter: Phone call for hospital follow up   Unable to reach patient  Medications: Outpatient Encounter Medications as of 02/06/2021  Medication Sig  . acetaminophen (TYLENOL) 325 MG tablet Take 650 mg by mouth every 4 (four) hours as needed.  Marland Kitchen amLODipine (NORVASC) 2.5 MG tablet TAKE ONE TABLET BY MOUTH EVERY DAY  . aspirin EC 81 MG tablet Take 81 mg by mouth daily. Swallow whole.  . Biotin 1000 MCG tablet Take 1,000 mcg by mouth daily.  . Blood Glucose Monitoring Suppl (ONE TOUCH ULTRA MINI) w/Device KIT Use daily to check blood sugar levels. E11.42  . calcium-vitamin D (OSCAL WITH D) 500-200 MG-UNIT tablet Take 2 tablets by mouth daily. For osteoporosis. (Patient not taking: Reported on 12/12/2020)  . cefdinir (OMNICEF) 300 MG capsule Take 1 capsule (300 mg total) by mouth 2 (two) times daily.  . celecoxib (CELEBREX) 200 MG capsule TAKE ONE CAPSULE BY MOUTH EVERY DAY  . cholecalciferol (VITAMIN D3) 25 MCG (1000 UNIT) tablet Take 2,000 Units by mouth daily.  . diclofenac Sodium (VOLTAREN) 1 % GEL Apply 1 application topically 2 (two) times daily.  Marland Kitchen dicyclomine (BENTYL) 10 MG capsule Take 10 mg by mouth 3 (three) times daily as needed.  . furosemide (LASIX) 40 MG tablet Take 40 mg by mouth daily as needed for edema. (Patient not taking: Reported on 12/12/2020)  . glucose blood (ONETOUCH ULTRA) test strip CHECK BLOOD SUGAR EVERY DAY  . hydrocortisone (ANUSOL-HC) 2.5 % rectal cream Place 1 application rectally every 6 (six) hours as needed for hemorrhoids or anal itching. (Patient not taking: Reported on 12/12/2020)  . levothyroxine (SYNTHROID) 50 MCG tablet TAKE ONE TABLET BY MOUTH EVERY MORNING 30 MIN-1 HOUR BEFORE EATING WITH WATER  . losartan (COZAAR) 50 MG tablet Take 1 tablet (50 mg total) by mouth daily. (Patient taking differently: Take 50 mg by mouth at bedtime.)  .  meclizine (ANTIVERT) 25 MG tablet Take 1 tablet by mouth 3 (three) times daily as needed for dizziness.  . metFORMIN (GLUCOPHAGE) 500 MG tablet Take 1 tablet (500 mg total) by mouth daily with breakfast.  . Milnacipran HCl (SAVELLA) 100 MG TABS tablet Take 1 tablet (100 mg total) by mouth 2 (two) times daily.  . Multiple Vitamin (MULTIVITAMIN) tablet Take 1 tablet by mouth daily.  . nitrofurantoin, macrocrystal-monohydrate, (MACROBID) 100 MG capsule Take 1 capsule (100 mg total) by mouth 2 (two) times daily.  . Omega-3 Fatty Acids (FISH OIL PO) Take 1 capsule by mouth daily. For cholesterol  . pantoprazole (PROTONIX) 40 MG tablet Take 1 tablet (40 mg total) by mouth daily.  . polyethylene glycol (MIRALAX / GLYCOLAX) 17 g packet Take 17 g by mouth daily. (Patient taking differently: Take 17 g by mouth daily as needed.)  . pregabalin (LYRICA) 300 MG capsule TAKE ONE CAPSULE BY MOUTH 2 TIMES A DAY  . rosuvastatin (CRESTOR) 5 MG tablet TAKE ONE TABLET BY MOUTH EVERY DAY  . solifenacin (VESICARE) 10 MG tablet TAKE ONE TABLET BY MOUTH EVERY DAY  . traMADol (ULTRAM) 50 MG tablet Take 50 mg by mouth every 12 (twelve) hours as needed.  . vitamin B-12 (CYANOCOBALAMIN) 1000 MCG tablet Take 1,000 mcg by mouth daily.   No facility-administered encounter medications on file as of 02/06/2021.    Clarita Leber, [place] Pharmacist Assistant [phone]

## 2021-02-21 DIAGNOSIS — R0602 Shortness of breath: Secondary | ICD-10-CM | POA: Diagnosis not present

## 2021-02-23 DIAGNOSIS — R300 Dysuria: Secondary | ICD-10-CM | POA: Diagnosis not present

## 2021-02-25 ENCOUNTER — Telehealth: Payer: Self-pay

## 2021-02-25 NOTE — Progress Notes (Signed)
Chronic Care Management Pharmacy Assistant   Name: Cindy Gordon  MRN: [id number] DOB: [birth year]  Cindy Gordon is an 80 y.o. year old female who presents for his follow-up CCM visit with the clinical pharmacist.  Reason for Encounter: Adherence call for diabetes    Recent office visits:  No office visits since last CPP appointment.  Recent consult visits:  No consult visits since last CPP appointment.  Hospital visits:  No hospital visits since last CPP appointment.  Medications: Outpatient Encounter Medications as of 02/25/2021  Medication Sig  . acetaminophen (TYLENOL) 325 MG tablet Take 650 mg by mouth every 4 (four) hours as needed.  Marland Kitchen amLODipine (NORVASC) 2.5 MG tablet TAKE ONE TABLET BY MOUTH EVERY DAY  . aspirin EC 81 MG tablet Take 81 mg by mouth daily. Swallow whole.  . Biotin 1000 MCG tablet Take 1,000 mcg by mouth daily.  . Blood Glucose Monitoring Suppl (ONE TOUCH ULTRA MINI) w/Device KIT Use daily to check blood sugar levels. E11.42  . calcium-vitamin D (OSCAL WITH D) 500-200 MG-UNIT tablet Take 2 tablets by mouth daily. For osteoporosis. (Patient not taking: Reported on 12/12/2020)  . cefdinir (OMNICEF) 300 MG capsule Take 1 capsule (300 mg total) by mouth 2 (two) times daily.  . celecoxib (CELEBREX) 200 MG capsule TAKE ONE CAPSULE BY MOUTH EVERY DAY  . cholecalciferol (VITAMIN D3) 25 MCG (1000 UNIT) tablet Take 2,000 Units by mouth daily.  . diclofenac Sodium (VOLTAREN) 1 % GEL Apply 1 application topically 2 (two) times daily.  Marland Kitchen dicyclomine (BENTYL) 10 MG capsule Take 10 mg by mouth 3 (three) times daily as needed.  . furosemide (LASIX) 40 MG tablet Take 40 mg by mouth daily as needed for edema. (Patient not taking: Reported on 12/12/2020)  . glucose blood (ONETOUCH ULTRA) test strip CHECK BLOOD SUGAR EVERY DAY  . hydrocortisone (ANUSOL-HC) 2.5 % rectal cream Place 1 application rectally every 6 (six) hours as needed for hemorrhoids or anal itching. (Patient  not taking: Reported on 12/12/2020)  . levothyroxine (SYNTHROID) 50 MCG tablet TAKE ONE TABLET BY MOUTH EVERY MORNING 30 MIN-1 HOUR BEFORE EATING WITH WATER  . losartan (COZAAR) 50 MG tablet Take 1 tablet (50 mg total) by mouth daily. (Patient taking differently: Take 50 mg by mouth at bedtime.)  . meclizine (ANTIVERT) 25 MG tablet Take 1 tablet by mouth 3 (three) times daily as needed for dizziness.  . metFORMIN (GLUCOPHAGE) 500 MG tablet Take 1 tablet (500 mg total) by mouth daily with breakfast.  . Milnacipran HCl (SAVELLA) 100 MG TABS tablet Take 1 tablet (100 mg total) by mouth 2 (two) times daily.  . Multiple Vitamin (MULTIVITAMIN) tablet Take 1 tablet by mouth daily.  . nitrofurantoin, macrocrystal-monohydrate, (MACROBID) 100 MG capsule Take 1 capsule (100 mg total) by mouth 2 (two) times daily.  . Omega-3 Fatty Acids (FISH OIL PO) Take 1 capsule by mouth daily. For cholesterol  . pantoprazole (PROTONIX) 40 MG tablet Take 1 tablet (40 mg total) by mouth daily.  . polyethylene glycol (MIRALAX / GLYCOLAX) 17 g packet Take 17 g by mouth daily. (Patient taking differently: Take 17 g by mouth daily as needed.)  . pregabalin (LYRICA) 300 MG capsule TAKE ONE CAPSULE BY MOUTH 2 TIMES A DAY  . rosuvastatin (CRESTOR) 5 MG tablet TAKE ONE TABLET BY MOUTH EVERY DAY  . solifenacin (VESICARE) 10 MG tablet TAKE ONE TABLET BY MOUTH EVERY DAY  . traMADol (ULTRAM) 50 MG tablet Take 50 mg by mouth every  12 (twelve) hours as needed.  . vitamin B-12 (CYANOCOBALAMIN) 1000 MCG tablet Take 1,000 mcg by mouth daily.   No facility-administered encounter medications on file as of 02/25/2021.   Reviewed chart prior to disease state call. Spoke with patient regarding BP  Recent Office Vitals: BP Readings from Last 3 Encounters:  12/24/20 136/68  12/19/20 130/63  10/25/20 118/62   Pulse Readings from Last 3 Encounters:  12/24/20 72  10/25/20 81  09/17/20 84    Wt Readings from Last 3 Encounters:  12/24/20 172  lb (78 kg)  10/25/20 167 lb (75.8 kg)  09/17/20 170 lb 6.4 oz (77.3 kg)     Kidney Function Lab Results  Component Value Date/Time   CREATININE 0.94 12/24/2020 10:08 AM   CREATININE 0.94 09/17/2020 10:14 AM   GFRNONAA 57 (L) 12/24/2020 10:08 AM   GFRAA 66 12/24/2020 10:08 AM    BMP Latest Ref Rng & Units 12/24/2020 09/17/2020 06/14/2020  Glucose 65 - 99 mg/dL 112(H) 120(H) 138(H)  BUN 8 - 27 mg/dL '12 18 18  ' Creatinine 0.57 - 1.00 mg/dL 0.94 0.94 0.88  BUN/Creat Ratio 12 - '28 13 19 20  ' Sodium 134 - 144 mmol/L 139 138 134  Potassium 3.5 - 5.2 mmol/L 4.7 4.9 5.0  Chloride 96 - 106 mmol/L 102 100 94(L)  CO2 20 - 29 mmol/L '21 26 27  ' Calcium 8.7 - 10.3 mg/dL 9.2 9.7 10.2    Current antihypertensive regimen:   Amlodipine 2.5 mg. daily  Furosemide 40 mg. daily  Losartan 50 mg. daily    02/25/2021: Called patient, VM full  02/27/2021: Called patient, LVM 03/01/2021: Called patient, LVM    Marcine Matar, [place] Clinical Pharmacist Assistant

## 2021-03-02 DIAGNOSIS — N390 Urinary tract infection, site not specified: Secondary | ICD-10-CM | POA: Diagnosis not present

## 2021-03-23 DIAGNOSIS — R9082 White matter disease, unspecified: Secondary | ICD-10-CM | POA: Diagnosis not present

## 2021-03-23 DIAGNOSIS — F32A Depression, unspecified: Secondary | ICD-10-CM | POA: Diagnosis not present

## 2021-03-23 DIAGNOSIS — N179 Acute kidney failure, unspecified: Secondary | ICD-10-CM | POA: Diagnosis not present

## 2021-03-23 DIAGNOSIS — N1330 Unspecified hydronephrosis: Secondary | ICD-10-CM | POA: Diagnosis not present

## 2021-03-23 DIAGNOSIS — Z66 Do not resuscitate: Secondary | ICD-10-CM | POA: Diagnosis not present

## 2021-03-23 DIAGNOSIS — E871 Hypo-osmolality and hyponatremia: Secondary | ICD-10-CM | POA: Diagnosis not present

## 2021-03-23 DIAGNOSIS — I700 Atherosclerosis of aorta: Secondary | ICD-10-CM | POA: Diagnosis not present

## 2021-03-23 DIAGNOSIS — Z880 Allergy status to penicillin: Secondary | ICD-10-CM | POA: Diagnosis not present

## 2021-03-23 DIAGNOSIS — W19XXXA Unspecified fall, initial encounter: Secondary | ICD-10-CM | POA: Diagnosis not present

## 2021-03-23 DIAGNOSIS — J986 Disorders of diaphragm: Secondary | ICD-10-CM | POA: Diagnosis not present

## 2021-03-23 DIAGNOSIS — Z87440 Personal history of urinary (tract) infections: Secondary | ICD-10-CM | POA: Diagnosis not present

## 2021-03-23 DIAGNOSIS — R531 Weakness: Secondary | ICD-10-CM | POA: Diagnosis not present

## 2021-03-23 DIAGNOSIS — S81812A Laceration without foreign body, left lower leg, initial encounter: Secondary | ICD-10-CM | POA: Diagnosis not present

## 2021-03-23 DIAGNOSIS — N3289 Other specified disorders of bladder: Secondary | ICD-10-CM | POA: Diagnosis not present

## 2021-03-23 DIAGNOSIS — E119 Type 2 diabetes mellitus without complications: Secondary | ICD-10-CM | POA: Diagnosis not present

## 2021-03-23 DIAGNOSIS — N3090 Cystitis, unspecified without hematuria: Secondary | ICD-10-CM | POA: Diagnosis not present

## 2021-03-23 DIAGNOSIS — E872 Acidosis: Secondary | ICD-10-CM | POA: Diagnosis not present

## 2021-03-23 DIAGNOSIS — N136 Pyonephrosis: Secondary | ICD-10-CM | POA: Diagnosis not present

## 2021-03-23 DIAGNOSIS — N3001 Acute cystitis with hematuria: Secondary | ICD-10-CM | POA: Diagnosis not present

## 2021-03-23 DIAGNOSIS — Z743 Need for continuous supervision: Secondary | ICD-10-CM | POA: Diagnosis not present

## 2021-03-23 DIAGNOSIS — K219 Gastro-esophageal reflux disease without esophagitis: Secondary | ICD-10-CM | POA: Diagnosis not present

## 2021-03-23 DIAGNOSIS — D649 Anemia, unspecified: Secondary | ICD-10-CM | POA: Diagnosis not present

## 2021-03-23 DIAGNOSIS — B9562 Methicillin resistant Staphylococcus aureus infection as the cause of diseases classified elsewhere: Secondary | ICD-10-CM | POA: Diagnosis not present

## 2021-03-23 DIAGNOSIS — Z7984 Long term (current) use of oral hypoglycemic drugs: Secondary | ICD-10-CM | POA: Diagnosis not present

## 2021-03-23 DIAGNOSIS — Z885 Allergy status to narcotic agent status: Secondary | ICD-10-CM | POA: Diagnosis not present

## 2021-03-23 DIAGNOSIS — Z7982 Long term (current) use of aspirin: Secondary | ICD-10-CM | POA: Diagnosis not present

## 2021-03-23 DIAGNOSIS — I10 Essential (primary) hypertension: Secondary | ICD-10-CM | POA: Diagnosis not present

## 2021-03-23 DIAGNOSIS — E039 Hypothyroidism, unspecified: Secondary | ICD-10-CM | POA: Diagnosis not present

## 2021-03-23 DIAGNOSIS — Z792 Long term (current) use of antibiotics: Secondary | ICD-10-CM | POA: Diagnosis not present

## 2021-03-23 DIAGNOSIS — Z79899 Other long term (current) drug therapy: Secondary | ICD-10-CM | POA: Diagnosis not present

## 2021-03-23 DIAGNOSIS — Z043 Encounter for examination and observation following other accident: Secondary | ICD-10-CM | POA: Diagnosis not present

## 2021-03-23 DIAGNOSIS — R279 Unspecified lack of coordination: Secondary | ICD-10-CM | POA: Diagnosis not present

## 2021-03-23 DIAGNOSIS — Z888 Allergy status to other drugs, medicaments and biological substances status: Secondary | ICD-10-CM | POA: Diagnosis not present

## 2021-03-23 DIAGNOSIS — M1990 Unspecified osteoarthritis, unspecified site: Secondary | ICD-10-CM | POA: Diagnosis not present

## 2021-03-23 DIAGNOSIS — Z7401 Bed confinement status: Secondary | ICD-10-CM | POA: Diagnosis not present

## 2021-03-23 DIAGNOSIS — G9341 Metabolic encephalopathy: Secondary | ICD-10-CM | POA: Diagnosis not present

## 2021-03-23 DIAGNOSIS — E785 Hyperlipidemia, unspecified: Secondary | ICD-10-CM | POA: Diagnosis not present

## 2021-03-25 ENCOUNTER — Telehealth: Payer: Self-pay

## 2021-03-25 NOTE — Progress Notes (Signed)
Chronic Care Management Pharmacy Assistant   Name: Cindy Gordon  MRN: [id number] DOB: January 24, 1941    Reason for Encounter: Disease State for DM     Recent office visits: No recent office visits noted   Recent consult visits: No recent consult visits noted   Hospital visits: No recent hospital visits noted     Medications: Outpatient Encounter Medications as of 03/25/2021  Medication Sig  . acetaminophen (TYLENOL) 325 MG tablet Take 650 mg by mouth every 4 (four) hours as needed.  Marland Kitchen amLODipine (NORVASC) 2.5 MG tablet TAKE ONE TABLET BY MOUTH EVERY DAY  . aspirin EC 81 MG tablet Take 81 mg by mouth daily. Swallow whole.  . Biotin 1000 MCG tablet Take 1,000 mcg by mouth daily.  . Blood Glucose Monitoring Suppl (ONE TOUCH ULTRA MINI) w/Device KIT Use daily to check blood sugar levels. E11.42  . calcium-vitamin D (OSCAL WITH D) 500-200 MG-UNIT tablet Take 2 tablets by mouth daily. For osteoporosis. (Patient not taking: Reported on 12/12/2020)  . cefdinir (OMNICEF) 300 MG capsule Take 1 capsule (300 mg total) by mouth 2 (two) times daily.  . celecoxib (CELEBREX) 200 MG capsule TAKE ONE CAPSULE BY MOUTH EVERY DAY  . cholecalciferol (VITAMIN D3) 25 MCG (1000 UNIT) tablet Take 2,000 Units by mouth daily.  . diclofenac Sodium (VOLTAREN) 1 % GEL Apply 1 application topically 2 (two) times daily.  Marland Kitchen dicyclomine (BENTYL) 10 MG capsule Take 10 mg by mouth 3 (three) times daily as needed.  . furosemide (LASIX) 40 MG tablet Take 40 mg by mouth daily as needed for edema. (Patient not taking: Reported on 12/12/2020)  . glucose blood (ONETOUCH ULTRA) test strip CHECK BLOOD SUGAR EVERY DAY  . hydrocortisone (ANUSOL-HC) 2.5 % rectal cream Place 1 application rectally every 6 (six) hours as needed for hemorrhoids or anal itching. (Patient not taking: Reported on 12/12/2020)  . levothyroxine (SYNTHROID) 50 MCG tablet TAKE ONE TABLET BY MOUTH EVERY MORNING 30 MIN-1 HOUR BEFORE EATING WITH WATER  .  losartan (COZAAR) 50 MG tablet Take 1 tablet (50 mg total) by mouth daily. (Patient taking differently: Take 50 mg by mouth at bedtime.)  . meclizine (ANTIVERT) 25 MG tablet Take 1 tablet by mouth 3 (three) times daily as needed for dizziness.  . metFORMIN (GLUCOPHAGE) 500 MG tablet Take 1 tablet (500 mg total) by mouth daily with breakfast.  . Milnacipran HCl (SAVELLA) 100 MG TABS tablet Take 1 tablet (100 mg total) by mouth 2 (two) times daily.  . Multiple Vitamin (MULTIVITAMIN) tablet Take 1 tablet by mouth daily.  . nitrofurantoin, macrocrystal-monohydrate, (MACROBID) 100 MG capsule Take 1 capsule (100 mg total) by mouth 2 (two) times daily.  . Omega-3 Fatty Acids (FISH OIL PO) Take 1 capsule by mouth daily. For cholesterol  . pantoprazole (PROTONIX) 40 MG tablet Take 1 tablet (40 mg total) by mouth daily.  . polyethylene glycol (MIRALAX / GLYCOLAX) 17 g packet Take 17 g by mouth daily. (Patient taking differently: Take 17 g by mouth daily as needed.)  . pregabalin (LYRICA) 300 MG capsule TAKE ONE CAPSULE BY MOUTH 2 TIMES A DAY  . rosuvastatin (CRESTOR) 5 MG tablet TAKE ONE TABLET BY MOUTH EVERY DAY  . solifenacin (VESICARE) 10 MG tablet TAKE ONE TABLET BY MOUTH EVERY DAY  . traMADol (ULTRAM) 50 MG tablet Take 50 mg by mouth every 12 (twelve) hours as needed.  . vitamin B-12 (CYANOCOBALAMIN) 1000 MCG tablet Take 1,000 mcg by mouth daily.   No  facility-administered encounter medications on file as of 03/25/2021.   Recent Relevant Labs: Lab Results  Component Value Date/Time   HGBA1C 5.5 12/24/2020 10:08 AM   HGBA1C 6.7 (H) 09/17/2020 10:14 AM   MICROALBUR 80 09/17/2020 10:18 AM    Kidney Function Lab Results  Component Value Date/Time   CREATININE 0.94 12/24/2020 10:08 AM   CREATININE 0.94 09/17/2020 10:14 AM   GFRNONAA 57 (L) 12/24/2020 10:08 AM   GFRAA 66 12/24/2020 10:08 AM     . Current antihyperglycemic regimen:  Metformin 500 mg. Tablet: Take 1 po qd     Does the patient  have >5 day gap between last estimated fill dates? Yes,  CPP to review   Star Rating Drugs:  Medication:  Last Fill: Day Supply Losartan   10/31/2020 90DS Metformin  09/20/2020 90DS Rosuvastatin  12/05/2020 90DS   CALLED PATIENT AND LVM:  Monday, 03/25/2021 @ 5:06 pm         Wednesday, 03/27/2021 @ 9:34 am          Thursday, 03/28/2021 @ 11:25 am         Thursday, 03/28/2021 @ 11:25 am (Son's cell)    Marcine Matar, [place] Clinical Pharmacist Assistant

## 2021-04-01 ENCOUNTER — Ambulatory Visit: Payer: Medicare Other | Admitting: Family Medicine

## 2021-04-01 DIAGNOSIS — Z741 Need for assistance with personal care: Secondary | ICD-10-CM | POA: Diagnosis not present

## 2021-04-01 DIAGNOSIS — M6281 Muscle weakness (generalized): Secondary | ICD-10-CM | POA: Diagnosis not present

## 2021-04-01 DIAGNOSIS — E7849 Other hyperlipidemia: Secondary | ICD-10-CM | POA: Diagnosis not present

## 2021-04-01 DIAGNOSIS — N182 Chronic kidney disease, stage 2 (mild): Secondary | ICD-10-CM | POA: Diagnosis not present

## 2021-04-01 DIAGNOSIS — M797 Fibromyalgia: Secondary | ICD-10-CM | POA: Diagnosis not present

## 2021-04-01 DIAGNOSIS — Z79899 Other long term (current) drug therapy: Secondary | ICD-10-CM | POA: Diagnosis not present

## 2021-04-01 DIAGNOSIS — E119 Type 2 diabetes mellitus without complications: Secondary | ICD-10-CM | POA: Diagnosis not present

## 2021-04-02 DIAGNOSIS — M797 Fibromyalgia: Secondary | ICD-10-CM | POA: Diagnosis not present

## 2021-04-02 DIAGNOSIS — Z741 Need for assistance with personal care: Secondary | ICD-10-CM | POA: Diagnosis not present

## 2021-04-02 DIAGNOSIS — M6281 Muscle weakness (generalized): Secondary | ICD-10-CM | POA: Diagnosis not present

## 2021-04-03 DIAGNOSIS — Z741 Need for assistance with personal care: Secondary | ICD-10-CM | POA: Diagnosis not present

## 2021-04-03 DIAGNOSIS — M6281 Muscle weakness (generalized): Secondary | ICD-10-CM | POA: Diagnosis not present

## 2021-04-03 DIAGNOSIS — M797 Fibromyalgia: Secondary | ICD-10-CM | POA: Diagnosis not present

## 2021-04-04 DIAGNOSIS — M797 Fibromyalgia: Secondary | ICD-10-CM | POA: Diagnosis not present

## 2021-04-04 DIAGNOSIS — M6281 Muscle weakness (generalized): Secondary | ICD-10-CM | POA: Diagnosis not present

## 2021-04-04 DIAGNOSIS — Z741 Need for assistance with personal care: Secondary | ICD-10-CM | POA: Diagnosis not present

## 2021-04-05 DIAGNOSIS — M6281 Muscle weakness (generalized): Secondary | ICD-10-CM | POA: Diagnosis not present

## 2021-04-05 DIAGNOSIS — Z741 Need for assistance with personal care: Secondary | ICD-10-CM | POA: Diagnosis not present

## 2021-04-05 DIAGNOSIS — M797 Fibromyalgia: Secondary | ICD-10-CM | POA: Diagnosis not present

## 2021-04-08 DIAGNOSIS — R1310 Dysphagia, unspecified: Secondary | ICD-10-CM | POA: Diagnosis not present

## 2021-04-08 DIAGNOSIS — Z741 Need for assistance with personal care: Secondary | ICD-10-CM | POA: Diagnosis not present

## 2021-04-08 DIAGNOSIS — R2681 Unsteadiness on feet: Secondary | ICD-10-CM | POA: Diagnosis not present

## 2021-04-08 DIAGNOSIS — R262 Difficulty in walking, not elsewhere classified: Secondary | ICD-10-CM | POA: Diagnosis not present

## 2021-04-08 DIAGNOSIS — M797 Fibromyalgia: Secondary | ICD-10-CM | POA: Diagnosis not present

## 2021-04-08 DIAGNOSIS — M6281 Muscle weakness (generalized): Secondary | ICD-10-CM | POA: Diagnosis not present

## 2021-04-09 DIAGNOSIS — M6281 Muscle weakness (generalized): Secondary | ICD-10-CM | POA: Diagnosis not present

## 2021-04-09 DIAGNOSIS — M797 Fibromyalgia: Secondary | ICD-10-CM | POA: Diagnosis not present

## 2021-04-09 DIAGNOSIS — Z741 Need for assistance with personal care: Secondary | ICD-10-CM | POA: Diagnosis not present

## 2021-04-09 DIAGNOSIS — R1310 Dysphagia, unspecified: Secondary | ICD-10-CM | POA: Diagnosis not present

## 2021-04-09 DIAGNOSIS — R2681 Unsteadiness on feet: Secondary | ICD-10-CM | POA: Diagnosis not present

## 2021-04-09 DIAGNOSIS — R262 Difficulty in walking, not elsewhere classified: Secondary | ICD-10-CM | POA: Diagnosis not present

## 2021-04-10 NOTE — Progress Notes (Deleted)
Chronic Care Management Pharmacy Note  04/10/2021 Name:  Cindy Gordon MRN:  [id number] DOB:  [birth year]  Summary: ***  Recommendations/Changes made from today's visit: ***  Plan: ***  Subjective: Cindy Gordon is an 80 y.o. year old female who is a primary patient of Cox, Kirsten, MD.  The CCM team was consulted for assistance with disease management and care coordination needs.    Engaged with patient by telephone for follow up visit in response to provider referral for pharmacy case management and/or care coordination services.   Consent to Services:  The patient was given information about Chronic Care Management services, agreed to services, and gave verbal consent prior to initiation of services.  Please see initial visit note for detailed documentation.   Patient Care Team: Rochel Brome, MD as PCP - General (Family Medicine) Burnice Logan, [facility] as Pharmacist (Pharmacist)  Recent office visits: *** 01/01/2021 - COVID booster  12/24/2020 - check blood sugars daily. Referral to urology for incontinence.  Recent consult visits: ***  Hospital visits: {Hospital DC Yes/No:25215}   Objective:  Lab Results  Component Value Date   CREATININE 0.94 12/24/2020   BUN 12 12/24/2020   GFRNONAA 57 (L) 12/24/2020   GFRAA 66 12/24/2020   NA 139 12/24/2020   K 4.7 12/24/2020   CALCIUM 9.2 12/24/2020   CO2 21 12/24/2020   GLUCOSE 112 (H) 12/24/2020    Lab Results  Component Value Date/Time   HGBA1C 5.5 12/24/2020 10:08 AM   HGBA1C 6.7 (H) 09/17/2020 10:14 AM   MICROALBUR 80 09/17/2020 10:18 AM    Last diabetic Eye exam: No results found for: HMDIABEYEEXA  Last diabetic Foot exam: No results found for: HMDIABFOOTEX   Lab Results  Component Value Date   CHOL 100 12/24/2020   HDL 33 (L) 12/24/2020   LDLCALC 48 12/24/2020   TRIG 101 12/24/2020   CHOLHDL 3.0 12/24/2020    Hepatic Function Latest Ref Rng & Units 12/24/2020 09/17/2020 06/14/2020  Total Protein 6.0 -  8.5 g/dL 6.8 6.6 7.3  Albumin 3.6 - 4.6 g/dL 3.6 3.8 4.1  AST 0 - 40 IU/L 25 17 52(H)  ALT 0 - 32 IU/L 20 21 48(H)  Alk Phosphatase 44 - 121 IU/L 88 94 68  Total Bilirubin 0.0 - 1.2 mg/dL 0.3 0.3 0.4    Lab Results  Component Value Date/Time   TSH 3.350 12/24/2020 10:08 AM   TSH 1.930 03/13/2020 11:35 AM    CBC Latest Ref Rng & Units 12/24/2020 09/17/2020 06/14/2020  WBC 3.4 - 10.8 x10E3/uL 7.5 6.7 11.3(H)  Hemoglobin 11.1 - 15.9 g/dL 13.3 12.7 13.0  Hematocrit 34.0 - 46.6 % 41.2 38.9 40.0  Platelets 150 - 450 x10E3/uL 255 313 266    Lab Results  Component Value Date/Time   VD25OH 36.5 10/25/2020 11:57 AM    Clinical ASCVD: {YES/NO:21197} The ASCVD Risk score [name], et al., 2013) failed to calculate for the following reasons:   The 2013 ASCVD risk score is only valid for ages 48 to 93    Depression screen PHQ 2/9 11/05/2020 09/17/2020 01/23/2020  Decreased Interest 0 0 0  Down, Depressed, Hopeless 0 0 0  PHQ - 2 Score 0 0 0     ***Other: (CHADS2VASc if Afib, MMRC or CAT for COPD, ACT, DEXA)  Social History   Tobacco Use  Smoking Status Never Smoker  Smokeless Tobacco Never Used   BP Readings from Last 3 Encounters:  12/24/20 136/68  12/19/20 130/63  10/25/20  118/62   Pulse Readings from Last 3 Encounters:  12/24/20 72  10/25/20 81  09/17/20 84   Wt Readings from Last 3 Encounters:  12/24/20 172 lb (78 kg)  10/25/20 167 lb (75.8 kg)  09/17/20 170 lb 6.4 oz (77.3 kg)   BMI Readings from Last 3 Encounters:  12/24/20 31.46 kg/m  10/25/20 30.54 kg/m  09/17/20 31.17 kg/m    Assessment/Interventions: Review of patient past medical history, allergies, medications, health status, including review of consultants reports, laboratory and other test data, was performed as part of comprehensive evaluation and provision of chronic care management services.   SDOH:  (Social Determinants of Health) assessments and interventions performed: {yes/no:20286}  SDOH  Screenings   Alcohol Screen: Not on file  Depression (PHQ2-9): Low Risk   . PHQ-2 Score: 0  Financial Resource Strain: Not on file  Food Insecurity: No Food Insecurity  . Worried About Charity fundraiser in the Last Year: Never true  . Ran Out of Food in the Last Year: Never true  Housing: Low Risk   . Last Housing Risk Score: 0  Physical Activity: Not on file  Social Connections: Not on file  Stress: Not on file  Tobacco Use: Low Risk   . Smoking Tobacco Use: Never Smoker  . Smokeless Tobacco Use: Never Used  Transportation Needs: Unknown  . Lack of Transportation (Medical): Not on file  . Lack of Transportation (Non-Medical): No    CCM Care Plan  Allergies  Allergen Reactions  . Contrast Media [Iodinated Diagnostic Agents] Other (See Comments)    Cardiac arrest  . Hydrocodone Other (See Comments)    Hallucinations  . Codeine   . Crestor [Rosuvastatin] Other (See Comments)    Myalgia   . Lisinopril   . Naproxen     constipation  . Niaspan [Niacin] Other (See Comments)    flushing  . Penicillins     Itchy.    Medications Reviewed Today    Reviewed by Arsenio Katz, CMA (Certified Medical Assistant) on 12/24/20 at [id number]  Med List Status: <None>  Medication Order Taking? Sig Documenting Provider Last Dose Status Informant  acetaminophen (TYLENOL) 325 MG tablet [id number] No Take 650 mg by mouth every 4 (four) hours as needed. [provider] Taking Active   amLODipine (NORVASC) 2.5 MG tablet [id number] No TAKE ONE TABLET BY MOUTH EVERY DAY Cox, Kirsten, MD Taking Active   aspirin EC 81 MG tablet [id number] No Take 81 mg by mouth daily. Swallow whole. [provider] Taking Active   Biotin 1000 MCG tablet [id number] No Take 1,000 mcg by mouth daily. [provider] Taking Active   Blood Glucose Monitoring Suppl (ONE TOUCH ULTRA MINI) w/Device KIT [id number] No Use daily to check blood sugar levels. E11.42 CoxElnita Maxwell, MD Taking Active    calcium-vitamin D (OSCAL WITH D) 500-200 MG-UNIT tablet [id number] No Take 2 tablets by mouth daily. For osteoporosis.  Patient not taking: Reported on 12/12/2020   [provider] Not Taking Active   cefdinir (OMNICEF) 300 MG capsule [id number]  Take 1 capsule (300 mg total) by mouth 2 (two) times daily. Cox, Kirsten, MD  Active   celecoxib (CELEBREX) 200 MG capsule [id number] No TAKE ONE CAPSULE BY MOUTH EVERY DAY Cox, Kirsten, MD Taking Active   cholecalciferol (VITAMIN D3) 25 MCG (1000 UNIT) tablet [id number] No Take 2,000 Units by mouth daily. [provider] Taking Active   diclofenac Sodium (VOLTAREN) 1 % GEL [id number] No Apply 1 application  topically 2 (two) times daily. [provider] Taking Active   dicyclomine (BENTYL) 10 MG capsule [id number] No Take 10 mg by mouth 3 (three) times daily as needed. [provider] Taking Active   furosemide (LASIX) 40 MG tablet [id number] No Take 40 mg by mouth daily as needed for edema.  Patient not taking: Reported on 12/12/2020   [provider] Not Taking Active   glucose blood (ONETOUCH ULTRA) test strip [id number]  CHECK BLOOD SUGAR EVERY DAY Cox, Kirsten, MD  Active   hydrocortisone (ANUSOL-HC) 2.5 % rectal cream [id number] No Place 1 application rectally every 6 (six) hours as needed for hemorrhoids or anal itching.  Patient not taking: Reported on 12/12/2020   [provider] Not Taking Active   levothyroxine (SYNTHROID) 50 MCG tablet [id number]  TAKE ONE TABLET BY MOUTH EVERY MORNING 30 MIN-1 HOUR BEFORE EATING WITH WATER Cox, Kirsten, MD  Active   losartan (COZAAR) 50 MG tablet [id number] No Take 1 tablet (50 mg total) by mouth daily.  Patient taking differently: Take 50 mg by mouth at bedtime.   Rip Harbour, NP Taking Active   meclizine (ANTIVERT) 25 MG tablet [id number] No Take 1 tablet by mouth 3 (three) times daily as needed for dizziness. [provider] Taking Active   metFORMIN  (GLUCOPHAGE) 500 MG tablet [id number] No Take 1 tablet (500 mg total) by mouth daily with breakfast. Rochel Brome, MD Taking Expired 10/21/20 2359   Milnacipran HCl (SAVELLA) 100 MG TABS tablet [id number] No Take 1 tablet (100 mg total) by mouth 2 (two) times daily. Cox, Kirsten, MD Taking Active   Multiple Vitamin (MULTIVITAMIN) tablet [id number] No Take 1 tablet by mouth daily. [provider] Taking Active   Omega-3 Fatty Acids (FISH OIL PO) [id number] No Take 1 capsule by mouth daily. For cholesterol [provider] Taking Active   pantoprazole (PROTONIX) 40 MG tablet [id number]  Take 1 tablet (40 mg total) by mouth daily. Cox, Kirsten, MD  Active   polyethylene glycol (MIRALAX / GLYCOLAX) 17 g packet [id number] No Take 17 g by mouth daily.  Patient taking differently: Take 17 g by mouth daily as needed.   Cox, Kirsten, MD Taking Active   pregabalin (LYRICA) 300 MG capsule [id number] No TAKE ONE CAPSULE BY MOUTH 2 TIMES A DAY Cox, Kirsten, MD Taking Active   rosuvastatin (CRESTOR) 5 MG tablet [id number] No TAKE ONE TABLET BY MOUTH EVERY DAY Cox, Kirsten, MD Taking Active   solifenacin (VESICARE) 10 MG tablet [id number] No Take 1 tablet (10 mg total) by mouth daily. Cox, Kirsten, MD Taking Active   traMADol (ULTRAM) 50 MG tablet [id number] No Take 50 mg by mouth every 12 (twelve) hours as needed. [provider] Taking Active   vitamin B-12 (CYANOCOBALAMIN) 1000 MCG tablet [id number] No Take 1,000 mcg by mouth daily. [provider] Taking Active           Patient Active Problem List   Diagnosis Date Noted  . Urge incontinence 07/23/2020  . Mixed hyperlipidemia 03/15/2020  . Controlled type 2 diabetes mellitus with diabetic polyneuropathy, without long-term current use of insulin ([place]) 03/15/2020  . Atrophy of thyroid 03/15/2020  . Fibromyalgia 03/15/2020  . Gastroesophageal reflux disease without esophagitis 03/15/2020  . OSA (obstructive sleep apnea) 03/15/2020   . Class 1 obesity due to excess calories with serious comorbidity and body mass index (BMI) of 32.0 to 32.9 in adult 03/15/2020  . Essential hypertension, benign 01/23/2020  . Acute cystitis without  hematuria 01/23/2020  . Vertigo 01/23/2020  . Fall on same level 01/23/2020  . Abrasion of right arm 01/23/2020    Immunization History  Administered Date(s) Administered  . Fluad Quad(high Dose 65+) 07/23/2020  . Influenza-Unspecified 09/09/2019  . Moderna SARS-COV2 Booster Vaccination 01/01/2021  . Moderna Sars-Covid-2 Vaccination 01/06/2020, 02/08/2020  . Pneumococcal Conjugate-13 08/31/2015  . Pneumococcal Polysaccharide-23 08/16/2013  . Tdap 04/20/2014  . Zoster Recombinat (Shingrix) 05/27/2018, 10/26/2018    Conditions to be addressed/monitored:  Hypertension, Hyperlipidemia and Diabetes  There are no care plans that you recently modified to display for this patient.    Medication Assistance: {MEDASSISTANCEINFO:25044}  Compliance/Adherence/Medication fill history: Care Gaps: Needs AWV  Star-Rating Drugs: Losartan 50 mg daily *** Metformin 500 mg daily *** Rosuvastatin 5 mg daily ***  Patient's preferred pharmacy is:  [facility], [place] [address] Phone: [phone] Fax: [phone]  Uses pill box? {Yes or If no, why not?:20788} Pt endorses ***% compliance  We discussed: {Pharmacy options:24294} Patient decided to: {[facility] Pharm[facility] [address]  Care Plan and Follow Up Patient Decision:  Patient agrees to Care Plan and Follow-up.  Plan: Telephone follow up appointment with care management team member scheduled for:  ***  ***   Current Barriers:  . {pharmacybarriers:24917}  Pharmacist Clinical Goal(s):  Marland Kitchen Patient will {PHARMACYGOALCHOICES:24921} through collaboration with PharmD and provider.   Interventions: . 1:1 collaboration with Rochel Brome, MD regarding development and update of  comprehensive plan of care as evidenced by provider attestation and co-signature . Inter-disciplinary care team collaboration (see longitudinal plan of care) . Comprehensive medication review performed; medication list updated in electronic medical record  Hypertension (BP goal <130/80) -{US controlled/uncontrolled:25276} -Current treatment: . Amlodipine 2.5 mg daily  . Losartan 50 mg daily  -Medications previously tried: ***  -Current home readings: *** -Current dietary habits: *** -Current exercise habits: *** -{ACTIONS;DENIES/REPORTS:21021675::"Denies"} hypotensive/hypertensive symptoms -Educated on {CCM BP Counseling:25124} -Counseled to monitor BP at home ***, document, and provide log at future appointments -{CCMPHARMDINTERVENTION:25122}  Hyperlipidemia: (LDL goal < 70) -Controlled -Current treatment: . rosuvastatin 5 mg daily  -Medications previously tried: ***  -Current dietary patterns: *** -Current exercise habits: *** -Educated on {CCM HLD Counseling:25126} -{CCMPHARMDINTERVENTION:25122}  Diabetes (A1c goal <7%) -Controlled -Current medications: Marland Kitchen Metformin 500 mg daily  -Medications previously tried: ***  -Current home glucose readings . fasting glucose: *** . post prandial glucose: *** -{ACTIONS;DENIES/REPORTS:21021675::"Denies"} hypoglycemic/hyperglycemic symptoms -Current meal patterns:  . breakfast: ***  . lunch: ***  . dinner: *** . snacks: *** . drinks: *** -Current exercise: *** -Educated on {CCM DM COUNSELING:25123} -Counseled to check feet daily and get yearly eye exams -{CCMPHARMDINTERVENTION:25122}   Patient Goals/Self-Care Activities . Patient will:  - {pharmacypatientgoals:24919}  Follow Up Plan: {CM FOLLOW UP RWER:15400}

## 2021-04-11 DIAGNOSIS — Z741 Need for assistance with personal care: Secondary | ICD-10-CM | POA: Diagnosis not present

## 2021-04-11 DIAGNOSIS — R262 Difficulty in walking, not elsewhere classified: Secondary | ICD-10-CM | POA: Diagnosis not present

## 2021-04-11 DIAGNOSIS — M797 Fibromyalgia: Secondary | ICD-10-CM | POA: Diagnosis not present

## 2021-04-11 DIAGNOSIS — M6281 Muscle weakness (generalized): Secondary | ICD-10-CM | POA: Diagnosis not present

## 2021-04-11 DIAGNOSIS — R1310 Dysphagia, unspecified: Secondary | ICD-10-CM | POA: Diagnosis not present

## 2021-04-11 DIAGNOSIS — R2681 Unsteadiness on feet: Secondary | ICD-10-CM | POA: Diagnosis not present

## 2021-04-12 ENCOUNTER — Telehealth: Payer: Medicare Other

## 2021-04-12 DIAGNOSIS — R262 Difficulty in walking, not elsewhere classified: Secondary | ICD-10-CM | POA: Diagnosis not present

## 2021-04-12 DIAGNOSIS — M797 Fibromyalgia: Secondary | ICD-10-CM | POA: Diagnosis not present

## 2021-04-12 DIAGNOSIS — R1310 Dysphagia, unspecified: Secondary | ICD-10-CM | POA: Diagnosis not present

## 2021-04-12 DIAGNOSIS — M6281 Muscle weakness (generalized): Secondary | ICD-10-CM | POA: Diagnosis not present

## 2021-04-12 DIAGNOSIS — R2681 Unsteadiness on feet: Secondary | ICD-10-CM | POA: Diagnosis not present

## 2021-04-12 DIAGNOSIS — Z741 Need for assistance with personal care: Secondary | ICD-10-CM | POA: Diagnosis not present

## 2021-04-15 DIAGNOSIS — M797 Fibromyalgia: Secondary | ICD-10-CM | POA: Diagnosis not present

## 2021-04-15 DIAGNOSIS — Z741 Need for assistance with personal care: Secondary | ICD-10-CM | POA: Diagnosis not present

## 2021-04-15 DIAGNOSIS — R262 Difficulty in walking, not elsewhere classified: Secondary | ICD-10-CM | POA: Diagnosis not present

## 2021-04-15 DIAGNOSIS — R1310 Dysphagia, unspecified: Secondary | ICD-10-CM | POA: Diagnosis not present

## 2021-04-15 DIAGNOSIS — M6281 Muscle weakness (generalized): Secondary | ICD-10-CM | POA: Diagnosis not present

## 2021-04-15 DIAGNOSIS — R2681 Unsteadiness on feet: Secondary | ICD-10-CM | POA: Diagnosis not present

## 2021-04-16 DIAGNOSIS — M6281 Muscle weakness (generalized): Secondary | ICD-10-CM | POA: Diagnosis not present

## 2021-04-16 DIAGNOSIS — R1310 Dysphagia, unspecified: Secondary | ICD-10-CM | POA: Diagnosis not present

## 2021-04-16 DIAGNOSIS — Z741 Need for assistance with personal care: Secondary | ICD-10-CM | POA: Diagnosis not present

## 2021-04-16 DIAGNOSIS — R262 Difficulty in walking, not elsewhere classified: Secondary | ICD-10-CM | POA: Diagnosis not present

## 2021-04-16 DIAGNOSIS — M797 Fibromyalgia: Secondary | ICD-10-CM | POA: Diagnosis not present

## 2021-04-16 DIAGNOSIS — R2681 Unsteadiness on feet: Secondary | ICD-10-CM | POA: Diagnosis not present

## 2021-04-17 DIAGNOSIS — R1310 Dysphagia, unspecified: Secondary | ICD-10-CM | POA: Diagnosis not present

## 2021-04-17 DIAGNOSIS — M6281 Muscle weakness (generalized): Secondary | ICD-10-CM | POA: Diagnosis not present

## 2021-04-17 DIAGNOSIS — M797 Fibromyalgia: Secondary | ICD-10-CM | POA: Diagnosis not present

## 2021-04-17 DIAGNOSIS — R2681 Unsteadiness on feet: Secondary | ICD-10-CM | POA: Diagnosis not present

## 2021-04-17 DIAGNOSIS — R262 Difficulty in walking, not elsewhere classified: Secondary | ICD-10-CM | POA: Diagnosis not present

## 2021-04-17 DIAGNOSIS — Z741 Need for assistance with personal care: Secondary | ICD-10-CM | POA: Diagnosis not present

## 2021-04-18 DIAGNOSIS — M6281 Muscle weakness (generalized): Secondary | ICD-10-CM | POA: Diagnosis not present

## 2021-04-18 DIAGNOSIS — R2681 Unsteadiness on feet: Secondary | ICD-10-CM | POA: Diagnosis not present

## 2021-04-18 DIAGNOSIS — R262 Difficulty in walking, not elsewhere classified: Secondary | ICD-10-CM | POA: Diagnosis not present

## 2021-04-18 DIAGNOSIS — R1310 Dysphagia, unspecified: Secondary | ICD-10-CM | POA: Diagnosis not present

## 2021-04-18 DIAGNOSIS — Z741 Need for assistance with personal care: Secondary | ICD-10-CM | POA: Diagnosis not present

## 2021-04-18 DIAGNOSIS — M797 Fibromyalgia: Secondary | ICD-10-CM | POA: Diagnosis not present

## 2021-04-19 DIAGNOSIS — R262 Difficulty in walking, not elsewhere classified: Secondary | ICD-10-CM | POA: Diagnosis not present

## 2021-04-19 DIAGNOSIS — R2681 Unsteadiness on feet: Secondary | ICD-10-CM | POA: Diagnosis not present

## 2021-04-19 DIAGNOSIS — R1310 Dysphagia, unspecified: Secondary | ICD-10-CM | POA: Diagnosis not present

## 2021-04-19 DIAGNOSIS — M797 Fibromyalgia: Secondary | ICD-10-CM | POA: Diagnosis not present

## 2021-04-19 DIAGNOSIS — Z741 Need for assistance with personal care: Secondary | ICD-10-CM | POA: Diagnosis not present

## 2021-04-19 DIAGNOSIS — M6281 Muscle weakness (generalized): Secondary | ICD-10-CM | POA: Diagnosis not present

## 2021-04-21 DIAGNOSIS — Z741 Need for assistance with personal care: Secondary | ICD-10-CM | POA: Diagnosis not present

## 2021-04-21 DIAGNOSIS — M797 Fibromyalgia: Secondary | ICD-10-CM | POA: Diagnosis not present

## 2021-04-21 DIAGNOSIS — R1310 Dysphagia, unspecified: Secondary | ICD-10-CM | POA: Diagnosis not present

## 2021-04-21 DIAGNOSIS — R262 Difficulty in walking, not elsewhere classified: Secondary | ICD-10-CM | POA: Diagnosis not present

## 2021-04-21 DIAGNOSIS — M6281 Muscle weakness (generalized): Secondary | ICD-10-CM | POA: Diagnosis not present

## 2021-04-21 DIAGNOSIS — R2681 Unsteadiness on feet: Secondary | ICD-10-CM | POA: Diagnosis not present

## 2021-04-22 DIAGNOSIS — Z741 Need for assistance with personal care: Secondary | ICD-10-CM | POA: Diagnosis not present

## 2021-04-22 DIAGNOSIS — R262 Difficulty in walking, not elsewhere classified: Secondary | ICD-10-CM | POA: Diagnosis not present

## 2021-04-22 DIAGNOSIS — R2681 Unsteadiness on feet: Secondary | ICD-10-CM | POA: Diagnosis not present

## 2021-04-22 DIAGNOSIS — M6281 Muscle weakness (generalized): Secondary | ICD-10-CM | POA: Diagnosis not present

## 2021-04-22 DIAGNOSIS — M797 Fibromyalgia: Secondary | ICD-10-CM | POA: Diagnosis not present

## 2021-04-22 DIAGNOSIS — R1310 Dysphagia, unspecified: Secondary | ICD-10-CM | POA: Diagnosis not present

## 2021-04-23 DIAGNOSIS — M797 Fibromyalgia: Secondary | ICD-10-CM | POA: Diagnosis not present

## 2021-04-23 DIAGNOSIS — M6281 Muscle weakness (generalized): Secondary | ICD-10-CM | POA: Diagnosis not present

## 2021-04-23 DIAGNOSIS — R262 Difficulty in walking, not elsewhere classified: Secondary | ICD-10-CM | POA: Diagnosis not present

## 2021-04-23 DIAGNOSIS — R2681 Unsteadiness on feet: Secondary | ICD-10-CM | POA: Diagnosis not present

## 2021-04-23 DIAGNOSIS — Z741 Need for assistance with personal care: Secondary | ICD-10-CM | POA: Diagnosis not present

## 2021-04-23 DIAGNOSIS — R1310 Dysphagia, unspecified: Secondary | ICD-10-CM | POA: Diagnosis not present

## 2021-04-24 DIAGNOSIS — R262 Difficulty in walking, not elsewhere classified: Secondary | ICD-10-CM | POA: Diagnosis not present

## 2021-04-24 DIAGNOSIS — R1310 Dysphagia, unspecified: Secondary | ICD-10-CM | POA: Diagnosis not present

## 2021-04-24 DIAGNOSIS — M6281 Muscle weakness (generalized): Secondary | ICD-10-CM | POA: Diagnosis not present

## 2021-04-24 DIAGNOSIS — Z741 Need for assistance with personal care: Secondary | ICD-10-CM | POA: Diagnosis not present

## 2021-04-24 DIAGNOSIS — M797 Fibromyalgia: Secondary | ICD-10-CM | POA: Diagnosis not present

## 2021-04-24 DIAGNOSIS — R2681 Unsteadiness on feet: Secondary | ICD-10-CM | POA: Diagnosis not present

## 2021-04-25 DIAGNOSIS — R1310 Dysphagia, unspecified: Secondary | ICD-10-CM | POA: Diagnosis not present

## 2021-04-25 DIAGNOSIS — R2681 Unsteadiness on feet: Secondary | ICD-10-CM | POA: Diagnosis not present

## 2021-04-25 DIAGNOSIS — Z741 Need for assistance with personal care: Secondary | ICD-10-CM | POA: Diagnosis not present

## 2021-04-25 DIAGNOSIS — M797 Fibromyalgia: Secondary | ICD-10-CM | POA: Diagnosis not present

## 2021-04-25 DIAGNOSIS — R262 Difficulty in walking, not elsewhere classified: Secondary | ICD-10-CM | POA: Diagnosis not present

## 2021-04-25 DIAGNOSIS — M6281 Muscle weakness (generalized): Secondary | ICD-10-CM | POA: Diagnosis not present

## 2021-04-26 DIAGNOSIS — R1310 Dysphagia, unspecified: Secondary | ICD-10-CM | POA: Diagnosis not present

## 2021-04-26 DIAGNOSIS — M6281 Muscle weakness (generalized): Secondary | ICD-10-CM | POA: Diagnosis not present

## 2021-04-26 DIAGNOSIS — M797 Fibromyalgia: Secondary | ICD-10-CM | POA: Diagnosis not present

## 2021-04-26 DIAGNOSIS — Z741 Need for assistance with personal care: Secondary | ICD-10-CM | POA: Diagnosis not present

## 2021-04-26 DIAGNOSIS — R262 Difficulty in walking, not elsewhere classified: Secondary | ICD-10-CM | POA: Diagnosis not present

## 2021-04-26 DIAGNOSIS — R2681 Unsteadiness on feet: Secondary | ICD-10-CM | POA: Diagnosis not present

## 2021-04-27 DIAGNOSIS — R262 Difficulty in walking, not elsewhere classified: Secondary | ICD-10-CM | POA: Diagnosis not present

## 2021-04-27 DIAGNOSIS — R2681 Unsteadiness on feet: Secondary | ICD-10-CM | POA: Diagnosis not present

## 2021-04-27 DIAGNOSIS — Z741 Need for assistance with personal care: Secondary | ICD-10-CM | POA: Diagnosis not present

## 2021-04-27 DIAGNOSIS — R1310 Dysphagia, unspecified: Secondary | ICD-10-CM | POA: Diagnosis not present

## 2021-04-27 DIAGNOSIS — M797 Fibromyalgia: Secondary | ICD-10-CM | POA: Diagnosis not present

## 2021-04-27 DIAGNOSIS — M6281 Muscle weakness (generalized): Secondary | ICD-10-CM | POA: Diagnosis not present

## 2021-04-29 DIAGNOSIS — Z741 Need for assistance with personal care: Secondary | ICD-10-CM | POA: Diagnosis not present

## 2021-04-29 DIAGNOSIS — M6281 Muscle weakness (generalized): Secondary | ICD-10-CM | POA: Diagnosis not present

## 2021-04-29 DIAGNOSIS — R262 Difficulty in walking, not elsewhere classified: Secondary | ICD-10-CM | POA: Diagnosis not present

## 2021-04-29 DIAGNOSIS — R2681 Unsteadiness on feet: Secondary | ICD-10-CM | POA: Diagnosis not present

## 2021-04-29 DIAGNOSIS — M797 Fibromyalgia: Secondary | ICD-10-CM | POA: Diagnosis not present

## 2021-04-29 DIAGNOSIS — R1310 Dysphagia, unspecified: Secondary | ICD-10-CM | POA: Diagnosis not present

## 2021-04-30 ENCOUNTER — Telehealth: Payer: Medicare Other

## 2021-04-30 DIAGNOSIS — R2681 Unsteadiness on feet: Secondary | ICD-10-CM | POA: Diagnosis not present

## 2021-04-30 DIAGNOSIS — R1310 Dysphagia, unspecified: Secondary | ICD-10-CM | POA: Diagnosis not present

## 2021-04-30 DIAGNOSIS — Z741 Need for assistance with personal care: Secondary | ICD-10-CM | POA: Diagnosis not present

## 2021-04-30 DIAGNOSIS — M6281 Muscle weakness (generalized): Secondary | ICD-10-CM | POA: Diagnosis not present

## 2021-04-30 DIAGNOSIS — R262 Difficulty in walking, not elsewhere classified: Secondary | ICD-10-CM | POA: Diagnosis not present

## 2021-04-30 DIAGNOSIS — M797 Fibromyalgia: Secondary | ICD-10-CM | POA: Diagnosis not present

## 2021-05-01 DIAGNOSIS — M797 Fibromyalgia: Secondary | ICD-10-CM | POA: Diagnosis not present

## 2021-05-01 DIAGNOSIS — R2681 Unsteadiness on feet: Secondary | ICD-10-CM | POA: Diagnosis not present

## 2021-05-01 DIAGNOSIS — Z741 Need for assistance with personal care: Secondary | ICD-10-CM | POA: Diagnosis not present

## 2021-05-01 DIAGNOSIS — R262 Difficulty in walking, not elsewhere classified: Secondary | ICD-10-CM | POA: Diagnosis not present

## 2021-05-01 DIAGNOSIS — R1310 Dysphagia, unspecified: Secondary | ICD-10-CM | POA: Diagnosis not present

## 2021-05-01 DIAGNOSIS — M6281 Muscle weakness (generalized): Secondary | ICD-10-CM | POA: Diagnosis not present

## 2021-05-03 DIAGNOSIS — M797 Fibromyalgia: Secondary | ICD-10-CM | POA: Diagnosis not present

## 2021-05-03 DIAGNOSIS — R262 Difficulty in walking, not elsewhere classified: Secondary | ICD-10-CM | POA: Diagnosis not present

## 2021-05-03 DIAGNOSIS — R2681 Unsteadiness on feet: Secondary | ICD-10-CM | POA: Diagnosis not present

## 2021-05-03 DIAGNOSIS — Z741 Need for assistance with personal care: Secondary | ICD-10-CM | POA: Diagnosis not present

## 2021-05-03 DIAGNOSIS — R1310 Dysphagia, unspecified: Secondary | ICD-10-CM | POA: Diagnosis not present

## 2021-05-03 DIAGNOSIS — M6281 Muscle weakness (generalized): Secondary | ICD-10-CM | POA: Diagnosis not present

## 2021-05-05 DIAGNOSIS — R1310 Dysphagia, unspecified: Secondary | ICD-10-CM | POA: Diagnosis not present

## 2021-05-05 DIAGNOSIS — R2681 Unsteadiness on feet: Secondary | ICD-10-CM | POA: Diagnosis not present

## 2021-05-05 DIAGNOSIS — M6281 Muscle weakness (generalized): Secondary | ICD-10-CM | POA: Diagnosis not present

## 2021-05-05 DIAGNOSIS — R262 Difficulty in walking, not elsewhere classified: Secondary | ICD-10-CM | POA: Diagnosis not present

## 2021-05-05 DIAGNOSIS — Z741 Need for assistance with personal care: Secondary | ICD-10-CM | POA: Diagnosis not present

## 2021-05-05 DIAGNOSIS — M797 Fibromyalgia: Secondary | ICD-10-CM | POA: Diagnosis not present

## 2021-05-06 ENCOUNTER — Telehealth: Payer: Self-pay

## 2021-05-06 DIAGNOSIS — R262 Difficulty in walking, not elsewhere classified: Secondary | ICD-10-CM | POA: Diagnosis not present

## 2021-05-06 DIAGNOSIS — M797 Fibromyalgia: Secondary | ICD-10-CM | POA: Diagnosis not present

## 2021-05-06 DIAGNOSIS — R2681 Unsteadiness on feet: Secondary | ICD-10-CM | POA: Diagnosis not present

## 2021-05-06 DIAGNOSIS — M6281 Muscle weakness (generalized): Secondary | ICD-10-CM | POA: Diagnosis not present

## 2021-05-06 DIAGNOSIS — Z741 Need for assistance with personal care: Secondary | ICD-10-CM | POA: Diagnosis not present

## 2021-05-06 DIAGNOSIS — R1310 Dysphagia, unspecified: Secondary | ICD-10-CM | POA: Diagnosis not present

## 2021-05-06 NOTE — Progress Notes (Signed)
Chronic Care Management Pharmacy Assistant   Name: Cindy Gordon  MRN: [id number] DOB: Feb 12, 1941  Cindy Gordon is an 80 y.o. year old female who presents for his follow-up CCM visit with the clinical pharmacist.   Reason for Encounter: Disease state call for DM    Recent office visits:  None noted since last CCM visit   Recent consult visits:  None noted since last CCM visit   Hospital visits:  None noted since last CCM visit   Medications: Outpatient Encounter Medications as of 05/06/2021  Medication Sig   acetaminophen (TYLENOL) 325 MG tablet Take 650 mg by mouth every 4 (four) hours as needed.   amLODipine (NORVASC) 2.5 MG tablet TAKE ONE TABLET BY MOUTH EVERY DAY   aspirin EC 81 MG tablet Take 81 mg by mouth daily. Swallow whole.   Biotin 1000 MCG tablet Take 1,000 mcg by mouth daily.   Blood Glucose Monitoring Suppl (ONE TOUCH ULTRA MINI) w/Device KIT Use daily to check blood sugar levels. E11.42   calcium-vitamin D (OSCAL WITH D) 500-200 MG-UNIT tablet Take 2 tablets by mouth daily. For osteoporosis. (Patient not taking: Reported on 12/12/2020)   cefdinir (OMNICEF) 300 MG capsule Take 1 capsule (300 mg total) by mouth 2 (two) times daily.   celecoxib (CELEBREX) 200 MG capsule TAKE ONE CAPSULE BY MOUTH EVERY DAY   cholecalciferol (VITAMIN D3) 25 MCG (1000 UNIT) tablet Take 2,000 Units by mouth daily.   diclofenac Sodium (VOLTAREN) 1 % GEL Apply 1 application topically 2 (two) times daily.   dicyclomine (BENTYL) 10 MG capsule Take 10 mg by mouth 3 (three) times daily as needed.   furosemide (LASIX) 40 MG tablet Take 40 mg by mouth daily as needed for edema. (Patient not taking: Reported on 12/12/2020)   glucose blood (ONETOUCH ULTRA) test strip CHECK BLOOD SUGAR EVERY DAY   hydrocortisone (ANUSOL-HC) 2.5 % rectal cream Place 1 application rectally every 6 (six) hours as needed for hemorrhoids or anal itching. (Patient not taking: Reported on 12/12/2020)   levothyroxine  (SYNTHROID) 50 MCG tablet TAKE ONE TABLET BY MOUTH EVERY MORNING 30 MIN-1 HOUR BEFORE EATING WITH WATER   losartan (COZAAR) 50 MG tablet Take 1 tablet (50 mg total) by mouth daily. (Patient taking differently: Take 50 mg by mouth at bedtime.)   meclizine (ANTIVERT) 25 MG tablet Take 1 tablet by mouth 3 (three) times daily as needed for dizziness.   metFORMIN (GLUCOPHAGE) 500 MG tablet Take 1 tablet (500 mg total) by mouth daily with breakfast.   Milnacipran HCl (SAVELLA) 100 MG TABS tablet Take 1 tablet (100 mg total) by mouth 2 (two) times daily.   Multiple Vitamin (MULTIVITAMIN) tablet Take 1 tablet by mouth daily.   nitrofurantoin, macrocrystal-monohydrate, (MACROBID) 100 MG capsule Take 1 capsule (100 mg total) by mouth 2 (two) times daily.   Omega-3 Fatty Acids (FISH OIL PO) Take 1 capsule by mouth daily. For cholesterol   pantoprazole (PROTONIX) 40 MG tablet Take 1 tablet (40 mg total) by mouth daily.   polyethylene glycol (MIRALAX / GLYCOLAX) 17 g packet Take 17 g by mouth daily. (Patient taking differently: Take 17 g by mouth daily as needed.)   pregabalin (LYRICA) 300 MG capsule TAKE ONE CAPSULE BY MOUTH 2 TIMES A DAY   rosuvastatin (CRESTOR) 5 MG tablet TAKE ONE TABLET BY MOUTH EVERY DAY   solifenacin (VESICARE) 10 MG tablet TAKE ONE TABLET BY MOUTH EVERY DAY   traMADol (ULTRAM) 50 MG tablet Take 50 mg by  mouth every 12 (twelve) hours as needed.   vitamin B-12 (CYANOCOBALAMIN) 1000 MCG tablet Take 1,000 mcg by mouth daily.   No facility-administered encounter medications on file as of 05/06/2021.   Recent Relevant Labs: Lab Results  Component Value Date/Time   HGBA1C 5.5 12/24/2020 10:08 AM   HGBA1C 6.7 (H) 09/17/2020 10:14 AM   MICROALBUR 80 09/17/2020 10:18 AM    Kidney Function Lab Results  Component Value Date/Time   CREATININE 0.94 12/24/2020 10:08 AM   CREATININE 0.94 09/17/2020 10:14 AM   GFRNONAA 57 (L) 12/24/2020 10:08 AM   GFRAA 66 12/24/2020 10:08 AM     Current  antihyperglycemic regimen:    Metformin 500 mg. Twice a day    Have there been any recent hospitalizations or ED visits since last visit with CPP? No   Adherence Review:  Is the patient currently on a STATIN medication? yes  Is the patient currently on ACE/ARB medication? Yes  Does the patient have >5 day gap between last estimated fill dates? CPP to review   Care Gaps:  Last eye exam / Retinopathy Screening? None noted within past year  Last Annual Wellness Visit? None noted within past year  Last Diabetic Foot Exam? None noted within past year   Star Rating Drugs: Medication:   Last Fill:   Day Supply Losartan 33m.  03/22/2021  90DS Metformin   03/17/2021  90DS Rosuvastatin  12/05/2020  90DS  Pharmacy confirmed last fill dates   KMarcine Matar CGrandfieldClinical Pharmacist Assistant

## 2021-05-07 DIAGNOSIS — R262 Difficulty in walking, not elsewhere classified: Secondary | ICD-10-CM | POA: Diagnosis not present

## 2021-05-07 DIAGNOSIS — Z741 Need for assistance with personal care: Secondary | ICD-10-CM | POA: Diagnosis not present

## 2021-05-07 DIAGNOSIS — M6281 Muscle weakness (generalized): Secondary | ICD-10-CM | POA: Diagnosis not present

## 2021-05-07 DIAGNOSIS — R2681 Unsteadiness on feet: Secondary | ICD-10-CM | POA: Diagnosis not present

## 2021-05-07 DIAGNOSIS — M797 Fibromyalgia: Secondary | ICD-10-CM | POA: Diagnosis not present

## 2021-05-07 DIAGNOSIS — R1310 Dysphagia, unspecified: Secondary | ICD-10-CM | POA: Diagnosis not present

## 2021-05-08 DIAGNOSIS — R1310 Dysphagia, unspecified: Secondary | ICD-10-CM | POA: Diagnosis not present

## 2021-05-08 DIAGNOSIS — R2681 Unsteadiness on feet: Secondary | ICD-10-CM | POA: Diagnosis not present

## 2021-05-08 DIAGNOSIS — Z741 Need for assistance with personal care: Secondary | ICD-10-CM | POA: Diagnosis not present

## 2021-05-08 DIAGNOSIS — M797 Fibromyalgia: Secondary | ICD-10-CM | POA: Diagnosis not present

## 2021-05-08 DIAGNOSIS — M6281 Muscle weakness (generalized): Secondary | ICD-10-CM | POA: Diagnosis not present

## 2021-05-08 DIAGNOSIS — R262 Difficulty in walking, not elsewhere classified: Secondary | ICD-10-CM | POA: Diagnosis not present

## 2021-05-09 DIAGNOSIS — R262 Difficulty in walking, not elsewhere classified: Secondary | ICD-10-CM | POA: Diagnosis not present

## 2021-05-09 DIAGNOSIS — R1310 Dysphagia, unspecified: Secondary | ICD-10-CM | POA: Diagnosis not present

## 2021-05-09 DIAGNOSIS — M6281 Muscle weakness (generalized): Secondary | ICD-10-CM | POA: Diagnosis not present

## 2021-05-09 DIAGNOSIS — R2681 Unsteadiness on feet: Secondary | ICD-10-CM | POA: Diagnosis not present

## 2021-05-09 DIAGNOSIS — Z741 Need for assistance with personal care: Secondary | ICD-10-CM | POA: Diagnosis not present

## 2021-05-09 DIAGNOSIS — M797 Fibromyalgia: Secondary | ICD-10-CM | POA: Diagnosis not present

## 2021-05-10 DIAGNOSIS — Z741 Need for assistance with personal care: Secondary | ICD-10-CM | POA: Diagnosis not present

## 2021-05-10 DIAGNOSIS — M797 Fibromyalgia: Secondary | ICD-10-CM | POA: Diagnosis not present

## 2021-05-10 DIAGNOSIS — R2681 Unsteadiness on feet: Secondary | ICD-10-CM | POA: Diagnosis not present

## 2021-05-10 DIAGNOSIS — R1310 Dysphagia, unspecified: Secondary | ICD-10-CM | POA: Diagnosis not present

## 2021-05-10 DIAGNOSIS — R262 Difficulty in walking, not elsewhere classified: Secondary | ICD-10-CM | POA: Diagnosis not present

## 2021-05-10 DIAGNOSIS — M6281 Muscle weakness (generalized): Secondary | ICD-10-CM | POA: Diagnosis not present

## 2021-05-13 DIAGNOSIS — M6281 Muscle weakness (generalized): Secondary | ICD-10-CM | POA: Diagnosis not present

## 2021-05-13 DIAGNOSIS — Z741 Need for assistance with personal care: Secondary | ICD-10-CM | POA: Diagnosis not present

## 2021-05-13 DIAGNOSIS — R1310 Dysphagia, unspecified: Secondary | ICD-10-CM | POA: Diagnosis not present

## 2021-05-13 DIAGNOSIS — R2681 Unsteadiness on feet: Secondary | ICD-10-CM | POA: Diagnosis not present

## 2021-05-13 DIAGNOSIS — M797 Fibromyalgia: Secondary | ICD-10-CM | POA: Diagnosis not present

## 2021-05-13 DIAGNOSIS — R262 Difficulty in walking, not elsewhere classified: Secondary | ICD-10-CM | POA: Diagnosis not present

## 2021-05-14 DIAGNOSIS — R1310 Dysphagia, unspecified: Secondary | ICD-10-CM | POA: Diagnosis not present

## 2021-05-14 DIAGNOSIS — R262 Difficulty in walking, not elsewhere classified: Secondary | ICD-10-CM | POA: Diagnosis not present

## 2021-05-14 DIAGNOSIS — M797 Fibromyalgia: Secondary | ICD-10-CM | POA: Diagnosis not present

## 2021-05-14 DIAGNOSIS — Z741 Need for assistance with personal care: Secondary | ICD-10-CM | POA: Diagnosis not present

## 2021-05-14 DIAGNOSIS — R2681 Unsteadiness on feet: Secondary | ICD-10-CM | POA: Diagnosis not present

## 2021-05-14 DIAGNOSIS — M6281 Muscle weakness (generalized): Secondary | ICD-10-CM | POA: Diagnosis not present

## 2021-05-22 DIAGNOSIS — M6281 Muscle weakness (generalized): Secondary | ICD-10-CM | POA: Diagnosis not present

## 2021-05-22 DIAGNOSIS — R1310 Dysphagia, unspecified: Secondary | ICD-10-CM | POA: Diagnosis not present

## 2021-05-22 DIAGNOSIS — R2681 Unsteadiness on feet: Secondary | ICD-10-CM | POA: Diagnosis not present

## 2021-05-22 DIAGNOSIS — M797 Fibromyalgia: Secondary | ICD-10-CM | POA: Diagnosis not present

## 2021-05-22 DIAGNOSIS — Z741 Need for assistance with personal care: Secondary | ICD-10-CM | POA: Diagnosis not present

## 2021-05-22 DIAGNOSIS — R262 Difficulty in walking, not elsewhere classified: Secondary | ICD-10-CM | POA: Diagnosis not present

## 2021-05-23 DIAGNOSIS — R1310 Dysphagia, unspecified: Secondary | ICD-10-CM | POA: Diagnosis not present

## 2021-05-23 DIAGNOSIS — R262 Difficulty in walking, not elsewhere classified: Secondary | ICD-10-CM | POA: Diagnosis not present

## 2021-05-23 DIAGNOSIS — Z741 Need for assistance with personal care: Secondary | ICD-10-CM | POA: Diagnosis not present

## 2021-05-23 DIAGNOSIS — M797 Fibromyalgia: Secondary | ICD-10-CM | POA: Diagnosis not present

## 2021-05-23 DIAGNOSIS — M6281 Muscle weakness (generalized): Secondary | ICD-10-CM | POA: Diagnosis not present

## 2021-05-23 DIAGNOSIS — R2681 Unsteadiness on feet: Secondary | ICD-10-CM | POA: Diagnosis not present

## 2021-05-24 DIAGNOSIS — R1310 Dysphagia, unspecified: Secondary | ICD-10-CM | POA: Diagnosis not present

## 2021-05-24 DIAGNOSIS — R262 Difficulty in walking, not elsewhere classified: Secondary | ICD-10-CM | POA: Diagnosis not present

## 2021-05-24 DIAGNOSIS — R2681 Unsteadiness on feet: Secondary | ICD-10-CM | POA: Diagnosis not present

## 2021-05-24 DIAGNOSIS — M6281 Muscle weakness (generalized): Secondary | ICD-10-CM | POA: Diagnosis not present

## 2021-05-24 DIAGNOSIS — M797 Fibromyalgia: Secondary | ICD-10-CM | POA: Diagnosis not present

## 2021-05-24 DIAGNOSIS — Z741 Need for assistance with personal care: Secondary | ICD-10-CM | POA: Diagnosis not present

## 2021-05-27 DIAGNOSIS — M6281 Muscle weakness (generalized): Secondary | ICD-10-CM | POA: Diagnosis not present

## 2021-05-27 DIAGNOSIS — R2681 Unsteadiness on feet: Secondary | ICD-10-CM | POA: Diagnosis not present

## 2021-05-27 DIAGNOSIS — Z741 Need for assistance with personal care: Secondary | ICD-10-CM | POA: Diagnosis not present

## 2021-05-27 DIAGNOSIS — M797 Fibromyalgia: Secondary | ICD-10-CM | POA: Diagnosis not present

## 2021-05-27 DIAGNOSIS — R1310 Dysphagia, unspecified: Secondary | ICD-10-CM | POA: Diagnosis not present

## 2021-05-27 DIAGNOSIS — R262 Difficulty in walking, not elsewhere classified: Secondary | ICD-10-CM | POA: Diagnosis not present

## 2021-05-29 DIAGNOSIS — M6281 Muscle weakness (generalized): Secondary | ICD-10-CM | POA: Diagnosis not present

## 2021-05-29 DIAGNOSIS — R1310 Dysphagia, unspecified: Secondary | ICD-10-CM | POA: Diagnosis not present

## 2021-05-29 DIAGNOSIS — R2681 Unsteadiness on feet: Secondary | ICD-10-CM | POA: Diagnosis not present

## 2021-05-29 DIAGNOSIS — R262 Difficulty in walking, not elsewhere classified: Secondary | ICD-10-CM | POA: Diagnosis not present

## 2021-05-29 DIAGNOSIS — M797 Fibromyalgia: Secondary | ICD-10-CM | POA: Diagnosis not present

## 2021-05-29 DIAGNOSIS — Z741 Need for assistance with personal care: Secondary | ICD-10-CM | POA: Diagnosis not present

## 2021-05-30 DIAGNOSIS — R1310 Dysphagia, unspecified: Secondary | ICD-10-CM | POA: Diagnosis not present

## 2021-05-30 DIAGNOSIS — R2681 Unsteadiness on feet: Secondary | ICD-10-CM | POA: Diagnosis not present

## 2021-05-30 DIAGNOSIS — M6281 Muscle weakness (generalized): Secondary | ICD-10-CM | POA: Diagnosis not present

## 2021-05-30 DIAGNOSIS — Z741 Need for assistance with personal care: Secondary | ICD-10-CM | POA: Diagnosis not present

## 2021-05-30 DIAGNOSIS — R262 Difficulty in walking, not elsewhere classified: Secondary | ICD-10-CM | POA: Diagnosis not present

## 2021-05-30 DIAGNOSIS — M797 Fibromyalgia: Secondary | ICD-10-CM | POA: Diagnosis not present

## 2021-05-31 DIAGNOSIS — R1310 Dysphagia, unspecified: Secondary | ICD-10-CM | POA: Diagnosis not present

## 2021-05-31 DIAGNOSIS — R262 Difficulty in walking, not elsewhere classified: Secondary | ICD-10-CM | POA: Diagnosis not present

## 2021-05-31 DIAGNOSIS — M797 Fibromyalgia: Secondary | ICD-10-CM | POA: Diagnosis not present

## 2021-05-31 DIAGNOSIS — M6281 Muscle weakness (generalized): Secondary | ICD-10-CM | POA: Diagnosis not present

## 2021-05-31 DIAGNOSIS — Z741 Need for assistance with personal care: Secondary | ICD-10-CM | POA: Diagnosis not present

## 2021-05-31 DIAGNOSIS — R2681 Unsteadiness on feet: Secondary | ICD-10-CM | POA: Diagnosis not present

## 2021-06-01 DIAGNOSIS — U071 COVID-19: Secondary | ICD-10-CM | POA: Diagnosis not present

## 2021-06-04 DIAGNOSIS — R1310 Dysphagia, unspecified: Secondary | ICD-10-CM | POA: Diagnosis not present

## 2021-06-04 DIAGNOSIS — M6281 Muscle weakness (generalized): Secondary | ICD-10-CM | POA: Diagnosis not present

## 2021-06-04 DIAGNOSIS — R2681 Unsteadiness on feet: Secondary | ICD-10-CM | POA: Diagnosis not present

## 2021-06-04 DIAGNOSIS — Z741 Need for assistance with personal care: Secondary | ICD-10-CM | POA: Diagnosis not present

## 2021-06-04 DIAGNOSIS — R262 Difficulty in walking, not elsewhere classified: Secondary | ICD-10-CM | POA: Diagnosis not present

## 2021-06-04 DIAGNOSIS — M797 Fibromyalgia: Secondary | ICD-10-CM | POA: Diagnosis not present

## 2021-06-18 DIAGNOSIS — R051 Acute cough: Secondary | ICD-10-CM | POA: Diagnosis not present

## 2021-06-18 DIAGNOSIS — N390 Urinary tract infection, site not specified: Secondary | ICD-10-CM | POA: Diagnosis not present

## 2021-06-20 ENCOUNTER — Inpatient Hospital Stay (HOSPITAL_COMMUNITY)
Admission: EM | Admit: 2021-06-20 | Discharge: 2021-06-25 | DRG: 690 | Disposition: A | Discharge status: Skilled Nursing Facility | Payer: Medicare Other | Source: Skilled Nursing Facility | Attending: Internal Medicine | Admitting: Internal Medicine

## 2021-06-20 ENCOUNTER — Emergency Department (HOSPITAL_COMMUNITY): Payer: Medicare Other

## 2021-06-20 ENCOUNTER — Other Ambulatory Visit: Payer: Self-pay

## 2021-06-20 DIAGNOSIS — E1142 Type 2 diabetes mellitus with diabetic polyneuropathy: Secondary | ICD-10-CM | POA: Diagnosis present

## 2021-06-20 DIAGNOSIS — E559 Vitamin D deficiency, unspecified: Secondary | ICD-10-CM | POA: Diagnosis not present

## 2021-06-20 DIAGNOSIS — Z7401 Bed confinement status: Secondary | ICD-10-CM | POA: Diagnosis not present

## 2021-06-20 DIAGNOSIS — Z8249 Family history of ischemic heart disease and other diseases of the circulatory system: Secondary | ICD-10-CM

## 2021-06-20 DIAGNOSIS — R6889 Other general symptoms and signs: Secondary | ICD-10-CM | POA: Diagnosis not present

## 2021-06-20 DIAGNOSIS — R310 Gross hematuria: Secondary | ICD-10-CM | POA: Diagnosis not present

## 2021-06-20 DIAGNOSIS — K219 Gastro-esophageal reflux disease without esophagitis: Secondary | ICD-10-CM | POA: Diagnosis not present

## 2021-06-20 DIAGNOSIS — E034 Atrophy of thyroid (acquired): Secondary | ICD-10-CM | POA: Diagnosis present

## 2021-06-20 DIAGNOSIS — R0602 Shortness of breath: Secondary | ICD-10-CM | POA: Diagnosis not present

## 2021-06-20 DIAGNOSIS — N3091 Cystitis, unspecified with hematuria: Secondary | ICD-10-CM | POA: Diagnosis not present

## 2021-06-20 DIAGNOSIS — M797 Fibromyalgia: Secondary | ICD-10-CM | POA: Diagnosis present

## 2021-06-20 DIAGNOSIS — E782 Mixed hyperlipidemia: Secondary | ICD-10-CM | POA: Diagnosis not present

## 2021-06-20 DIAGNOSIS — N1330 Unspecified hydronephrosis: Secondary | ICD-10-CM | POA: Diagnosis not present

## 2021-06-20 DIAGNOSIS — L8990 Pressure ulcer of unspecified site, unspecified stage: Secondary | ICD-10-CM | POA: Insufficient documentation

## 2021-06-20 DIAGNOSIS — N319 Neuromuscular dysfunction of bladder, unspecified: Secondary | ICD-10-CM | POA: Diagnosis present

## 2021-06-20 DIAGNOSIS — D62 Acute posthemorrhagic anemia: Secondary | ICD-10-CM | POA: Diagnosis present

## 2021-06-20 DIAGNOSIS — K5730 Diverticulosis of large intestine without perforation or abscess without bleeding: Secondary | ICD-10-CM | POA: Diagnosis not present

## 2021-06-20 DIAGNOSIS — Z90710 Acquired absence of both cervix and uterus: Secondary | ICD-10-CM

## 2021-06-20 DIAGNOSIS — R319 Hematuria, unspecified: Secondary | ICD-10-CM | POA: Diagnosis present

## 2021-06-20 DIAGNOSIS — G4733 Obstructive sleep apnea (adult) (pediatric): Secondary | ICD-10-CM | POA: Diagnosis not present

## 2021-06-20 DIAGNOSIS — Z833 Family history of diabetes mellitus: Secondary | ICD-10-CM | POA: Diagnosis not present

## 2021-06-20 DIAGNOSIS — I10 Essential (primary) hypertension: Secondary | ICD-10-CM | POA: Diagnosis present

## 2021-06-20 DIAGNOSIS — E538 Deficiency of other specified B group vitamins: Secondary | ICD-10-CM | POA: Diagnosis present

## 2021-06-20 DIAGNOSIS — Z8616 Personal history of COVID-19: Secondary | ICD-10-CM | POA: Diagnosis not present

## 2021-06-20 DIAGNOSIS — N136 Pyonephrosis: Secondary | ICD-10-CM | POA: Diagnosis not present

## 2021-06-20 DIAGNOSIS — Z66 Do not resuscitate: Secondary | ICD-10-CM | POA: Diagnosis present

## 2021-06-20 DIAGNOSIS — K439 Ventral hernia without obstruction or gangrene: Secondary | ICD-10-CM | POA: Diagnosis not present

## 2021-06-20 DIAGNOSIS — Z7982 Long term (current) use of aspirin: Secondary | ICD-10-CM

## 2021-06-20 DIAGNOSIS — Z7984 Long term (current) use of oral hypoglycemic drugs: Secondary | ICD-10-CM | POA: Diagnosis not present

## 2021-06-20 DIAGNOSIS — R112 Nausea with vomiting, unspecified: Secondary | ICD-10-CM | POA: Diagnosis not present

## 2021-06-20 DIAGNOSIS — R42 Dizziness and giddiness: Secondary | ICD-10-CM | POA: Diagnosis not present

## 2021-06-20 DIAGNOSIS — Z1621 Resistance to vancomycin: Secondary | ICD-10-CM | POA: Diagnosis not present

## 2021-06-20 DIAGNOSIS — B952 Enterococcus as the cause of diseases classified elsewhere: Secondary | ICD-10-CM | POA: Diagnosis not present

## 2021-06-20 DIAGNOSIS — R531 Weakness: Secondary | ICD-10-CM | POA: Diagnosis not present

## 2021-06-20 DIAGNOSIS — N390 Urinary tract infection, site not specified: Secondary | ICD-10-CM | POA: Diagnosis not present

## 2021-06-20 DIAGNOSIS — Z743 Need for continuous supervision: Secondary | ICD-10-CM | POA: Diagnosis not present

## 2021-06-20 DIAGNOSIS — Z79890 Hormone replacement therapy (postmenopausal): Secondary | ICD-10-CM

## 2021-06-20 DIAGNOSIS — G9340 Encephalopathy, unspecified: Secondary | ICD-10-CM | POA: Diagnosis present

## 2021-06-20 DIAGNOSIS — E872 Acidosis: Secondary | ICD-10-CM | POA: Diagnosis not present

## 2021-06-20 DIAGNOSIS — I517 Cardiomegaly: Secondary | ICD-10-CM | POA: Diagnosis not present

## 2021-06-20 DIAGNOSIS — Z993 Dependence on wheelchair: Secondary | ICD-10-CM

## 2021-06-20 DIAGNOSIS — Z7901 Long term (current) use of anticoagulants: Secondary | ICD-10-CM | POA: Diagnosis not present

## 2021-06-20 DIAGNOSIS — L89152 Pressure ulcer of sacral region, stage 2: Secondary | ICD-10-CM | POA: Diagnosis not present

## 2021-06-20 DIAGNOSIS — Z9049 Acquired absence of other specified parts of digestive tract: Secondary | ICD-10-CM | POA: Diagnosis not present

## 2021-06-20 DIAGNOSIS — N3090 Cystitis, unspecified without hematuria: Secondary | ICD-10-CM | POA: Diagnosis not present

## 2021-06-20 DIAGNOSIS — I499 Cardiac arrhythmia, unspecified: Secondary | ICD-10-CM | POA: Diagnosis not present

## 2021-06-20 DIAGNOSIS — E876 Hypokalemia: Secondary | ICD-10-CM | POA: Diagnosis present

## 2021-06-20 DIAGNOSIS — R58 Hemorrhage, not elsewhere classified: Secondary | ICD-10-CM | POA: Diagnosis not present

## 2021-06-20 DIAGNOSIS — Z79899 Other long term (current) drug therapy: Secondary | ICD-10-CM | POA: Diagnosis not present

## 2021-06-20 LAB — COMPREHENSIVE METABOLIC PANEL
ALT: 22 U/L (ref 0–44)
AST: 18 U/L (ref 15–41)
Albumin: 2.2 g/dL — ABNORMAL LOW (ref 3.5–5.0)
Alkaline Phosphatase: 92 U/L (ref 38–126)
Anion gap: 13 (ref 5–15)
BUN: 19 mg/dL (ref 8–23)
CO2: 21 mmol/L — ABNORMAL LOW (ref 22–32)
Calcium: 8.6 mg/dL — ABNORMAL LOW (ref 8.9–10.3)
Chloride: 101 mmol/L (ref 98–111)
Creatinine, Ser: 1.11 mg/dL — ABNORMAL HIGH (ref 0.44–1.00)
GFR, Estimated: 50 mL/min — ABNORMAL LOW (ref >60)
Glucose, Bld: 151 mg/dL — ABNORMAL HIGH (ref 70–99)
Potassium: 3.2 mmol/L — ABNORMAL LOW (ref 3.5–5.1)
Sodium: 135 mmol/L (ref 135–145)
Total Bilirubin: 0.4 mg/dL (ref 0.3–1.2)
Total Protein: 6.2 g/dL — ABNORMAL LOW (ref 6.5–8.1)

## 2021-06-20 LAB — PROTIME-INR
INR: 1.7 — ABNORMAL HIGH (ref 0.8–1.2)
Prothrombin Time: 19.5 seconds — ABNORMAL HIGH (ref 11.4–15.2)

## 2021-06-20 LAB — CBC
HCT: 32.8 % — ABNORMAL LOW (ref 36.0–46.0)
Hemoglobin: 10.4 g/dL — ABNORMAL LOW (ref 12.0–15.0)
MCH: 27.7 pg (ref 26.0–34.0)
MCHC: 31.7 g/dL (ref 30.0–36.0)
MCV: 87.2 fL (ref 80.0–100.0)
Platelets: 238 10*3/uL (ref 150–400)
RBC: 3.76 MIL/uL — ABNORMAL LOW (ref 3.87–5.11)
RDW: 17.8 % — ABNORMAL HIGH (ref 11.5–15.5)
WBC: 13.3 10*3/uL — ABNORMAL HIGH (ref 4.0–10.5)
nRBC: 0 % (ref 0.0–0.2)

## 2021-06-20 LAB — APTT: aPTT: 40 seconds — ABNORMAL HIGH (ref 24–36)

## 2021-06-20 NOTE — ED Triage Notes (Signed)
Pt came from [facility] via EMS. Dx with UTI x 3 days ago; prescribed rocephin and been taking it x 3 days. Per staff at [facility], pt has been passing "huge" blood clots vaginally x 3 days. Staff also reports that patient has had tea colored urine x 3 days. Pt alert to self only. Pt is newly taking Eliquis but staff is unsure of when she started taking it.  101 bpm 176 CBG 100/68 BP 94%

## 2021-06-20 NOTE — ED Provider Notes (Signed)
Cindy Gordon DEPT Provider Note   CSN: [id number] Arrival date & time: 06/20/21  2121     History Chief Complaint  Patient presents with   Vaginal Bleeding    Cindy Gordon is a 80 y.o. female with a history of diabetes mellitus, chronic back pain who presents to the emergency department by EMS from [facility] facility.  History is provided by Cindy Schwartz, LPN, at [facility].  The patient was diagnosed with COVID-19 within the last few weeks.  As part is the facility's protocol, residents were started on Eliquis.  Her first dose of Eliquis was on July 24.  Prior to COVID-19, patient was alert and oriented x3.  However, staff states that since her diagnosis that she has been fatigued, sleeping most of the time, and has been more confused.  Now, she knows her name and knows she is in a facility and recognizes her family, but is otherwise not oriented.  Approximately 3 days ago, she was diagnosed with a UTI and was started on IM Rocephin.  She has received 3 doses of the medication.  Yesterday, the patient was noted to have some blood in her diaper that was more consistent with spotting.  Today, the bleeding intensified and her diapers were filled with large clots of blood, which prompted her visit to the ED.  Per staff, the patient is a full code.  Level 5 caveat secondary to altered mental status.  The history is provided by the patient and medical records. No language interpreter was used.      Past Medical History:  Diagnosis Date   Atrophy of thyroid (acquired)    Carpal tunnel syndrome    Chronic back pain    Diabetes mellitus without complication (HCC)    Fibromyalgia    Gastro-esophageal reflux disease without esophagitis    Hyperlipidemia    Hypertension    Irritable bowel syndrome    Localized edema    Localized swelling, mass and lump, neck    Neuromuscular dysfunction of bladder, unspecified    Obstructive sleep apnea (adult)  (pediatric)    Other obesity    Other specified hypothyroidism    Sciatica, right side    Urge incontinence    Vitamin B 12 deficiency    Vitamin D deficiency     Patient Active Problem List   Diagnosis Date Noted   Urge incontinence 07/23/2020   Mixed hyperlipidemia 03/15/2020   Controlled type 2 diabetes mellitus with diabetic polyneuropathy, without long-term current use of insulin ([place]) 03/15/2020   Atrophy of thyroid 03/15/2020   Fibromyalgia 03/15/2020   Gastroesophageal reflux disease without esophagitis 03/15/2020   OSA (obstructive sleep apnea) 03/15/2020   Class 1 obesity due to excess calories with serious comorbidity and body mass index (BMI) of 32.0 to 32.9 in adult 03/15/2020   Essential hypertension, benign 01/23/2020   Acute cystitis without hematuria 01/23/2020   Vertigo 01/23/2020   Fall on same level 01/23/2020   Abrasion of right arm 01/23/2020    Past Surgical History:  Procedure Laterality Date   APPENDECTOMY     BLADDER SUSPENSION     BREAST BIOPSY     right breast: benign   carpel tunnel repair Left 08/2014   CATARACT EXTRACTION  2019   right    CHOLECYSTECTOMY OPEN  1980   acute panceatitis:requiring removal of stones from pancreatitic duct   dobutamine nuclear stress test     12/2010   HYSTERECTOMY ABDOMINAL WITH SALPINGO-OOPHORECTOMY  LUMBAR LAMINECTOMY     RECTOCELE REPAIR  02/2011   REVERSE TOTAL SHOULDER ARTHROPLASTY  07/2017   TOTAL SHOULDER REPLACEMENT  2002   secondary to staph infection   VEIN LIGATION AND STRIPPING       OB History   No obstetric history on file.     Family History  Problem Relation Age of Onset   Alzheimer's disease Mother    CVA Mother    Alzheimer's disease Sister    Diabetes Sister    Hypertension Sister    Hypothyroidism Sister    Congenital heart disease Sister    Prostate cancer Son    Coronary artery disease Daughter    Peptic Ulcer Other    Diabetes type II Other    Cystic fibrosis  Grandson     Social History   Tobacco Use   Smoking status: Never   Smokeless tobacco: Never  Vaping Use   Vaping Use: Never used  Substance Use Topics   Alcohol use: Never   Drug use: Never    Home Medications Prior to Admission medications   Medication Sig Start Date End Date Taking? Authorizing Provider  acetaminophen (TYLENOL) 325 MG tablet Take 650 mg by mouth every 4 (four) hours as needed.    [provider]  amLODipine (NORVASC) 2.5 MG tablet TAKE ONE TABLET BY MOUTH EVERY DAY 09/03/20   Cox, Elnita Maxwell, MD  aspirin EC 81 MG tablet Take 81 mg by mouth daily. Swallow whole.    [provider]  Biotin 1000 MCG tablet Take 1,000 mcg by mouth daily.    [provider]  Blood Glucose Monitoring Suppl (ONE TOUCH ULTRA MINI) w/Device KIT Use daily to check blood sugar levels. E11.42 06/20/20   CoxElnita Maxwell, MD  calcium-vitamin D (OSCAL WITH D) 500-200 MG-UNIT tablet Take 2 tablets by mouth daily. For osteoporosis. Patient not taking: Reported on 12/12/2020    [provider]  cefdinir (OMNICEF) 300 MG capsule Take 1 capsule (300 mg total) by mouth 2 (two) times daily. 12/19/20   Cox, Elnita Maxwell, MD  celecoxib (CELEBREX) 200 MG capsule TAKE ONE CAPSULE BY MOUTH EVERY DAY 09/03/20   Cox, Elnita Maxwell, MD  cholecalciferol (VITAMIN D3) 25 MCG (1000 UNIT) tablet Take 2,000 Units by mouth daily.    [provider]  diclofenac Sodium (VOLTAREN) 1 % GEL Apply 1 application topically 2 (two) times daily.    [provider]  dicyclomine (BENTYL) 10 MG capsule Take 10 mg by mouth 3 (three) times daily as needed.    [provider]  furosemide (LASIX) 40 MG tablet Take 40 mg by mouth daily as needed for edema. Patient not taking: Reported on 12/12/2020    [provider]  glucose blood (ONETOUCH ULTRA) test strip CHECK BLOOD SUGAR EVERY DAY 12/17/20   Cox, Elnita Maxwell, MD  hydrocortisone (ANUSOL-HC) 2.5 % rectal cream Place 1 application rectally  every 6 (six) hours as needed for hemorrhoids or anal itching. Patient not taking: Reported on 12/12/2020    [provider]  levothyroxine (SYNTHROID) 50 MCG tablet TAKE ONE TABLET BY MOUTH EVERY MORNING 30 MIN-1 HOUR BEFORE EATING WITH WATER 12/19/20   Cox, Kirsten, MD  losartan (COZAAR) 50 MG tablet Take 1 tablet (50 mg total) by mouth daily. Patient taking differently: Take 50 mg by mouth at bedtime. 10/31/20   Rip Harbour, NP  meclizine (ANTIVERT) 25 MG tablet Take 1 tablet by mouth 3 (three) times daily as needed for dizziness. 01/06/20  [provider]  metFORMIN (GLUCOPHAGE) 500 MG tablet Take 1 tablet (500 mg total) by mouth daily with breakfast. 07/23/20 10/21/20  CoxElnita Maxwell, MD  Milnacipran HCl (SAVELLA) 100 MG TABS tablet Take 1 tablet (100 mg total) by mouth 2 (two) times daily. 07/23/20   CoxElnita Maxwell, MD  Multiple Vitamin (MULTIVITAMIN) tablet Take 1 tablet by mouth daily.    [provider]  nitrofurantoin, macrocrystal-monohydrate, (MACROBID) 100 MG capsule Take 1 capsule (100 mg total) by mouth 2 (two) times daily. 12/24/20   CoxElnita Maxwell, MD  Omega-3 Fatty Acids (FISH OIL PO) Take 1 capsule by mouth daily. For cholesterol    [provider]  pantoprazole (PROTONIX) 40 MG tablet Take 1 tablet (40 mg total) by mouth daily. 12/17/20   Cox, Elnita Maxwell, MD  polyethylene glycol (MIRALAX / GLYCOLAX) 17 g packet Take 17 g by mouth daily. Patient taking differently: Take 17 g by mouth daily as needed. 07/23/20   Cox, Elnita Maxwell, MD  pregabalin (LYRICA) 300 MG capsule TAKE ONE CAPSULE BY MOUTH 2 TIMES A DAY 09/20/20   Cox, Kirsten, MD  rosuvastatin (CRESTOR) 5 MG tablet TAKE ONE TABLET BY MOUTH EVERY DAY 12/05/20   Cox, Elnita Maxwell, MD  solifenacin (VESICARE) 10 MG tablet TAKE ONE TABLET BY MOUTH EVERY DAY 01/02/21   Cox, Elnita Maxwell, MD  traMADol (ULTRAM) 50 MG tablet Take 50 mg by mouth every 12 (twelve) hours as needed.    [provider]  vitamin B-12  (CYANOCOBALAMIN) 1000 MCG tablet Take 1,000 mcg by mouth daily.    [provider]    Allergies    Contrast media [iodinated diagnostic agents], Hydrocodone, Codeine, Crestor [rosuvastatin], Lisinopril, Naproxen, Niaspan [niacin], and Penicillins  Review of Systems   Review of Systems  Unable to perform ROS: Mental status change   Physical Exam Updated Vital Signs BP (!) 111/55 (BP Location: Left Arm)   Pulse 94   Temp 98.1 F (36.7 C) (Oral)   Resp 11   Ht _0  (1.575 m)   Wt 78 kg   SpO2 100%   BMI 31.45 kg/m   Physical Exam Vitals and nursing note reviewed.  Constitutional:      General: She is not in acute distress.    Appearance: She is ill-appearing.  HENT:     Head: Normocephalic.  Eyes:     Conjunctiva/sclera: Conjunctivae normal.  Cardiovascular:     Rate and Rhythm: Normal rate and regular rhythm.     Heart sounds: No murmur heard.   No friction rub. No gallop.  Pulmonary:     Effort: Pulmonary effort is normal. No respiratory distress.     Breath sounds: No stridor. Wheezing present. No rhonchi or rales.  Abdominal:     General: There is no distension.     Palpations: Abdomen is soft. There is no mass.     Tenderness: There is no abdominal tenderness. There is no right CVA tenderness, left CVA tenderness, guarding or rebound.     Hernia: No hernia is present.     Comments: Abdomen is soft, nontender, nondistended.  Musculoskeletal:     Cervical back: Neck supple.     Right lower leg: No edema.     Left lower leg: No edema.  Skin:    Coloration: Skin is pale.     Findings: No rash.     Comments: Multiple areas of ecchymosis noted to the left forearm.  Neurological:     Mental Status: She is alert.  Comments: Oriented to self.  She is confused.  Somnolent, but will intermittently respond slowly to voice.  She is only able to intermittently answer questions.  Psychiatric:        Behavior: Behavior normal.    ED Results / Procedures /  Treatments   Labs (all labs ordered are listed, but only abnormal results are displayed) Labs Reviewed - No data to display  EKG None  Radiology No results found.  Procedures .Critical Care  Date/Time: 06/21/2021 3:14 AM Performed by: Joanne Gavel, PA-C Authorized by: Joanne Gavel, PA-C   Critical care provider statement:    Critical care time (minutes):  65   Critical care time was exclusive of:  Separately billable procedures and treating other patients and teaching time   Critical care was necessary to treat or prevent imminent or life-threatening deterioration of the following conditions:  Circulatory failure and sepsis   Critical care was time spent personally by me on the following activities:  Ordering and performing treatments and interventions, ordering and review of laboratory studies, ordering and review of radiographic studies, pulse oximetry, re-evaluation of patient's condition, review of old charts, obtaining history from patient or surrogate, examination of patient, evaluation of patient's response to treatment and development of treatment plan with patient or surrogate   I assumed direction of critical care for this patient from another provider in my specialty: no     Care discussed with: admitting provider     Medications Ordered in ED Medications - No data to display  ED Course  I have reviewed the triage vital signs and the nursing notes.  Pertinent labs & imaging results that were available during my care of the patient were reviewed by me and considered in my medical decision making (see chart for details).  Clinical Course as of 06/21/21 1324  Fri Jun 21, 2021  0013 Spoke with the patient's son Hetty Blend, he states that the patient is a DNR.  However, he does report that if a blood transfusion was indicated that she would be agreeable to a transfusion.  [facility] preferred contact number was updated in the patient's chart. [MM]    Clinical Course  User Index [MM] Maevyn Riordan, Laymond Purser, PA-C   MDM Rules/Calculators/A&P                           80 year old female with a history of diabetes mellitus, chronic back pain who presents the emergency department from [facility] facility with bleeding.  Staff is noticed blood in her diaper since yesterday.  Staff is uncertain of etiology of blood.  She was recently diagnosed with UTI 3 days ago and started on IM Rocephin.  After her first dose of Rocephin, spotting was noted and bleeding worsened today.  Of note, patient was also diagnosed with COVID-19 several weeks ago.  As far as the facility's protocol, she was started on Eliquis.  No tachycardia.  Patient is normotensive.  No hypoxia, tachypnea.  She is afebrile.  On exam, she is oriented to self only.  She is ill-appearing.  She has pallor.  Labs and imaging of been reviewed and independently interpreted by me.  Regards to bleeding, on pelvic exam, she did have scant blood noted at the cervix.  There was 1 small clot noted in the vaginal vault.  RN reports that when initial In-N-Out cath was being performed without a large clot was noted during the procedure and the  urethra.  Three-way Foley was placed and patient is having gross hematuria.  She has had a 3 g hemoglobin drop, but hemoglobin is currently 10.4.  We will continue to monitor.  INR is elevated at 1.7.  I discussed the patient with the pharmacy and since etiology of bleeding was unclear at this time they recommended giving the patient Eppie Gibson, which has been initiated.  CT abdomen pelvis without contrast with hemorrhagic cystitis.  The patient does have a leukocytosis today and mild lactic acidosis.  Since she was being treated for UTI at her facility, will obtain blood cultures, urine culture, and start her on Rocephin since previous cultures and sensitivities have been susceptible to Rocephin.  She has been given 1 L of LR as she did have 1 isolated soft blood pressure, but no  hypotension.  No indication for blood products at this time.  She did have approximately 400 cc of hematuria during in and out catheterization.  She has had approximately 150 cc since three-way Foley was placed.  We will continue to monitor I's and O's.  The patient has been discussed with Dr. Ralene Bathe, to the physician.  Consult to the hospitalist service and Dr. Hal Hope will accept the patient for admission for hemorrhagic cystitis.  Although there is concern for sepsis, will avoid 30 cc/kg bolus of fluids at this time as this will cause worsening delusionary anemia.  The patient appears reasonably stabilized for admission considering the current resources, flow, and capabilities available in the ED at this time, and I doubt any other [facility] requiring further screening and/or treatment in the ED prior to admission.     Final Clinical Impression(s) / ED Diagnoses Final diagnoses:  None    Rx / DC Orders ED Discharge Orders     None        Joanne Gavel, PA-C 06/21/21 0315    Quintella Reichert, MD 06/21/21 [id number]

## 2021-06-21 ENCOUNTER — Emergency Department (HOSPITAL_COMMUNITY): Payer: Medicare Other

## 2021-06-21 ENCOUNTER — Other Ambulatory Visit: Payer: Self-pay

## 2021-06-21 ENCOUNTER — Encounter (HOSPITAL_COMMUNITY): Payer: Self-pay | Admitting: Internal Medicine

## 2021-06-21 DIAGNOSIS — G9340 Encephalopathy, unspecified: Secondary | ICD-10-CM | POA: Diagnosis present

## 2021-06-21 DIAGNOSIS — R319 Hematuria, unspecified: Secondary | ICD-10-CM | POA: Diagnosis not present

## 2021-06-21 DIAGNOSIS — R310 Gross hematuria: Secondary | ICD-10-CM

## 2021-06-21 DIAGNOSIS — Z1621 Resistance to vancomycin: Secondary | ICD-10-CM | POA: Diagnosis present

## 2021-06-21 DIAGNOSIS — K219 Gastro-esophageal reflux disease without esophagitis: Secondary | ICD-10-CM | POA: Diagnosis present

## 2021-06-21 DIAGNOSIS — Z66 Do not resuscitate: Secondary | ICD-10-CM | POA: Diagnosis present

## 2021-06-21 DIAGNOSIS — E782 Mixed hyperlipidemia: Secondary | ICD-10-CM | POA: Diagnosis present

## 2021-06-21 DIAGNOSIS — B952 Enterococcus as the cause of diseases classified elsewhere: Secondary | ICD-10-CM | POA: Diagnosis present

## 2021-06-21 DIAGNOSIS — E1142 Type 2 diabetes mellitus with diabetic polyneuropathy: Secondary | ICD-10-CM

## 2021-06-21 DIAGNOSIS — L8990 Pressure ulcer of unspecified site, unspecified stage: Secondary | ICD-10-CM | POA: Insufficient documentation

## 2021-06-21 DIAGNOSIS — M797 Fibromyalgia: Secondary | ICD-10-CM | POA: Diagnosis present

## 2021-06-21 DIAGNOSIS — D62 Acute posthemorrhagic anemia: Secondary | ICD-10-CM | POA: Diagnosis present

## 2021-06-21 DIAGNOSIS — E872 Acidosis: Secondary | ICD-10-CM | POA: Diagnosis present

## 2021-06-21 DIAGNOSIS — E538 Deficiency of other specified B group vitamins: Secondary | ICD-10-CM | POA: Diagnosis present

## 2021-06-21 DIAGNOSIS — Z8249 Family history of ischemic heart disease and other diseases of the circulatory system: Secondary | ICD-10-CM | POA: Diagnosis not present

## 2021-06-21 DIAGNOSIS — E034 Atrophy of thyroid (acquired): Secondary | ICD-10-CM | POA: Diagnosis present

## 2021-06-21 DIAGNOSIS — Z8616 Personal history of COVID-19: Secondary | ICD-10-CM | POA: Diagnosis not present

## 2021-06-21 DIAGNOSIS — Z7982 Long term (current) use of aspirin: Secondary | ICD-10-CM | POA: Diagnosis not present

## 2021-06-21 DIAGNOSIS — N136 Pyonephrosis: Secondary | ICD-10-CM | POA: Diagnosis present

## 2021-06-21 DIAGNOSIS — Z833 Family history of diabetes mellitus: Secondary | ICD-10-CM | POA: Diagnosis not present

## 2021-06-21 DIAGNOSIS — Z90710 Acquired absence of both cervix and uterus: Secondary | ICD-10-CM | POA: Diagnosis not present

## 2021-06-21 DIAGNOSIS — Z9049 Acquired absence of other specified parts of digestive tract: Secondary | ICD-10-CM | POA: Diagnosis not present

## 2021-06-21 DIAGNOSIS — Z7984 Long term (current) use of oral hypoglycemic drugs: Secondary | ICD-10-CM | POA: Diagnosis not present

## 2021-06-21 DIAGNOSIS — L89152 Pressure ulcer of sacral region, stage 2: Secondary | ICD-10-CM | POA: Diagnosis present

## 2021-06-21 DIAGNOSIS — Z79890 Hormone replacement therapy (postmenopausal): Secondary | ICD-10-CM | POA: Diagnosis not present

## 2021-06-21 DIAGNOSIS — Z79899 Other long term (current) drug therapy: Secondary | ICD-10-CM | POA: Diagnosis not present

## 2021-06-21 DIAGNOSIS — E559 Vitamin D deficiency, unspecified: Secondary | ICD-10-CM | POA: Diagnosis present

## 2021-06-21 LAB — BASIC METABOLIC PANEL
Anion gap: 12 (ref 5–15)
BUN: 18 mg/dL (ref 8–23)
CO2: 21 mmol/L — ABNORMAL LOW (ref 22–32)
Calcium: 8.4 mg/dL — ABNORMAL LOW (ref 8.9–10.3)
Chloride: 102 mmol/L (ref 98–111)
Creatinine, Ser: 1.07 mg/dL — ABNORMAL HIGH (ref 0.44–1.00)
GFR, Estimated: 52 mL/min — ABNORMAL LOW (ref >60)
Glucose, Bld: 133 mg/dL — ABNORMAL HIGH (ref 70–99)
Potassium: 2.9 mmol/L — ABNORMAL LOW (ref 3.5–5.1)
Sodium: 135 mmol/L (ref 135–145)

## 2021-06-21 LAB — CBC
HCT: 26.8 % — ABNORMAL LOW (ref 36.0–46.0)
HCT: 26.6 % — ABNORMAL LOW (ref 36.0–46.0)
Hemoglobin: 8.4 g/dL — ABNORMAL LOW (ref 12.0–15.0)
Hemoglobin: 8.3 g/dL — ABNORMAL LOW (ref 12.0–15.0)
MCH: 27.5 pg (ref 26.0–34.0)
MCH: 27.4 pg (ref 26.0–34.0)
MCHC: 31.3 g/dL (ref 30.0–36.0)
MCHC: 31.2 g/dL (ref 30.0–36.0)
MCV: 87.6 fL (ref 80.0–100.0)
MCV: 87.8 fL (ref 80.0–100.0)
Platelets: 205 10*3/uL (ref 150–400)
Platelets: 205 10*3/uL (ref 150–400)
RBC: 3.06 MIL/uL — ABNORMAL LOW (ref 3.87–5.11)
RBC: 3.03 MIL/uL — ABNORMAL LOW (ref 3.87–5.11)
RDW: 17.5 % — ABNORMAL HIGH (ref 11.5–15.5)
RDW: 17.8 % — ABNORMAL HIGH (ref 11.5–15.5)
WBC: 14.7 10*3/uL — ABNORMAL HIGH (ref 4.0–10.5)
WBC: 15.2 10*3/uL — ABNORMAL HIGH (ref 4.0–10.5)
nRBC: 0 % (ref 0.0–0.2)
nRBC: 0 % (ref 0.0–0.2)

## 2021-06-21 LAB — RESP PANEL BY RT-PCR (FLU A&B, COVID) ARPGX2
Influenza A by PCR: NEGATIVE
Influenza B by PCR: NEGATIVE
SARS Coronavirus 2 by RT PCR: NEGATIVE

## 2021-06-21 LAB — URINALYSIS, ROUTINE W REFLEX MICROSCOPIC
Bacteria, UA: NONE SEEN
Specific Gravity, Urine: 1.015 (ref 1.005–1.030)

## 2021-06-21 LAB — GLUCOSE, CAPILLARY: Glucose-Capillary: 118 mg/dL — ABNORMAL HIGH (ref 70–99)

## 2021-06-21 LAB — CBG MONITORING, ED
Glucose-Capillary: 114 mg/dL — ABNORMAL HIGH (ref 70–99)
Glucose-Capillary: 123 mg/dL — ABNORMAL HIGH (ref 70–99)

## 2021-06-21 LAB — ABO/RH: ABO/RH(D): O POS

## 2021-06-21 LAB — LACTIC ACID, PLASMA
Lactic Acid, Venous: 2.3 mmol/L (ref 0.5–1.9)
Lactic Acid, Venous: 2.3 mmol/L (ref 0.5–1.9)

## 2021-06-21 MED ORDER — ACETAMINOPHEN 325 MG PO TABS
650.0000 mg | ORAL_TABLET | Freq: Four times a day (QID) | ORAL | Status: DC | PRN
  Administered 2021-06-23 (×2): 650 mg via ORAL
  Filled 2021-06-21 (×2): qty 2

## 2021-06-21 MED ORDER — ATORVASTATIN CALCIUM 10 MG PO TABS
10.0000 mg | ORAL_TABLET | Freq: Every day | ORAL | Status: DC
  Administered 2021-06-21 – 2021-06-25 (×5): 10 mg via ORAL
  Filled 2021-06-21 (×5): qty 1

## 2021-06-21 MED ORDER — AMLODIPINE BESYLATE 5 MG PO TABS
2.5000 mg | ORAL_TABLET | Freq: Every day | ORAL | Status: DC
  Administered 2021-06-21 – 2021-06-25 (×5): 2.5 mg via ORAL
  Filled 2021-06-21 (×5): qty 1

## 2021-06-21 MED ORDER — INSULIN ASPART 100 UNIT/ML IJ SOLN
0.0000 [IU] | Freq: Three times a day (TID) | INTRAMUSCULAR | Status: DC
  Administered 2021-06-22: 1 [IU] via SUBCUTANEOUS
  Administered 2021-06-23: 3 [IU] via SUBCUTANEOUS
  Administered 2021-06-23 – 2021-06-24 (×2): 1 [IU] via SUBCUTANEOUS
  Administered 2021-06-25: 3 [IU] via SUBCUTANEOUS
  Filled 2021-06-21: qty 0.09

## 2021-06-21 MED ORDER — ACETAMINOPHEN 650 MG RE SUPP: 650.0000 mg | Freq: Four times a day (QID) | RECTAL | Status: DC | PRN

## 2021-06-21 MED ORDER — SODIUM CHLORIDE 0.9 % IV SOLN: INTRAVENOUS | Status: DC

## 2021-06-21 MED ORDER — POTASSIUM CHLORIDE CRYS ER 20 MEQ PO TBCR
40.0000 meq | EXTENDED_RELEASE_TABLET | ORAL | Status: AC
  Administered 2021-06-21 (×2): 40 meq via ORAL
  Filled 2021-06-21 (×2): qty 2

## 2021-06-21 MED ORDER — LOSARTAN POTASSIUM 25 MG PO TABS: 50.0000 mg | ORAL_TABLET | Freq: Every day | ORAL | Status: DC

## 2021-06-21 MED ORDER — PROTHROMBIN COMPLEX CONC HUMAN 500 UNITS IV KIT
3894.0000 [IU] | PACK | INTRAVENOUS | Status: AC
  Administered 2021-06-21: 3894 [IU] via INTRAVENOUS
  Filled 2021-06-21: qty 3894

## 2021-06-21 MED ORDER — SODIUM CHLORIDE 0.9 % IR SOLN
3000.0000 mL | Status: DC
  Administered 2021-06-21 – 2021-06-22 (×5): 3000 mL

## 2021-06-21 MED ORDER — HYDRALAZINE HCL 20 MG/ML IJ SOLN: 10.0000 mg | INTRAMUSCULAR | Status: DC | PRN

## 2021-06-21 MED ORDER — SODIUM CHLORIDE 0.9 % IV SOLN
1.0000 g | INTRAVENOUS | Status: DC
  Administered 2021-06-21 – 2021-06-22 (×2): 1 g via INTRAVENOUS
  Filled 2021-06-21: qty 1
  Filled 2021-06-21: qty 10

## 2021-06-21 MED ORDER — CHLORHEXIDINE GLUCONATE CLOTH 2 % EX PADS
6.0000 | MEDICATED_PAD | Freq: Every day | CUTANEOUS | Status: DC
  Administered 2021-06-22 – 2021-06-25 (×4): 6 via TOPICAL

## 2021-06-21 MED ORDER — LACTATED RINGERS IV BOLUS
1000.0000 mL | Freq: Once | INTRAVENOUS | Status: AC
  Administered 2021-06-21: 1000 mL via INTRAVENOUS

## 2021-06-21 MED ORDER — PANTOPRAZOLE SODIUM 40 MG PO TBEC
40.0000 mg | DELAYED_RELEASE_TABLET | Freq: Every day | ORAL | Status: DC
  Administered 2021-06-21 – 2021-06-25 (×5): 40 mg via ORAL
  Filled 2021-06-21 (×5): qty 1

## 2021-06-21 MED ORDER — LEVOTHYROXINE SODIUM 75 MCG PO TABS
75.0000 ug | ORAL_TABLET | Freq: Every day | ORAL | Status: DC
  Administered 2021-06-21 – 2021-06-25 (×5): 75 ug via ORAL
  Filled 2021-06-21 (×5): qty 1

## 2021-06-21 NOTE — ED Notes (Signed)
Notified provider of K of 2.9

## 2021-06-21 NOTE — H&P (Signed)
History and Physical    Leeba Barbe NWG:[id number] DOB: 1941/01/06 DOA: 06/20/2021  PCP: Rochel Brome, MD  Patient coming from: [facility].  Chief Complaint: Bleeding.  HPI: Cindy Gordon is a 80 y.o. female with history of hypertension, diabetes mellitus, hypothyroidism per report was recently diagnosed about 3 weeks ago with COVID infection at that time per the nursing home protocol was placed on apixaban.  3 days ago patient was diagnosed with urinary tract infection was placed on antibiotics.  Patient was noticed to have increasing bleeding from the vaginal area.  Patient was brought to the ER.  ED Course: In the ER blood work show mildly elevated creatinine from baseline also hemoglobin dropped by 3 g from recent past.  CT head was unremarkable.  CT abdomen pelvis done shows features concerning for clot in the urinary bladder and also interstitial cystitis findings and ascending infection.  Patient was treated antibiotics and patient also was given Kcentra.  Patient admitted for further management of hematuria and possible developing urinary tract infection.  COVID test was negative.  Patient on exam is mildly confused.  Review of Systems: As per HPI, rest all negative.   Past Medical History:  Diagnosis Date   Atrophy of thyroid (acquired)    Carpal tunnel syndrome    Chronic back pain    Diabetes mellitus without complication (HCC)    Fibromyalgia    Gastro-esophageal reflux disease without esophagitis    Hyperlipidemia    Hypertension    Irritable bowel syndrome    Localized edema    Localized swelling, mass and lump, neck    Neuromuscular dysfunction of bladder, unspecified    Obstructive sleep apnea (adult) (pediatric)    Other obesity    Other specified hypothyroidism    Sciatica, right side    Urge incontinence    Vitamin B 12 deficiency    Vitamin D deficiency     Past Surgical History:  Procedure Laterality Date   APPENDECTOMY     BLADDER  SUSPENSION     BREAST BIOPSY     right breast: benign   carpel tunnel repair Left 08/2014   CATARACT EXTRACTION  2019   right    CHOLECYSTECTOMY OPEN  1980   acute panceatitis:requiring removal of stones from pancreatitic duct   dobutamine nuclear stress test     12/2010   HYSTERECTOMY ABDOMINAL WITH SALPINGO-OOPHORECTOMY     LUMBAR LAMINECTOMY     RECTOCELE REPAIR  02/2011   REVERSE TOTAL SHOULDER ARTHROPLASTY  07/2017   TOTAL SHOULDER REPLACEMENT  2002   secondary to staph infection   VEIN LIGATION AND STRIPPING       reports that she has never smoked. She has never used smokeless tobacco. She reports that she does not drink alcohol and does not use drugs.  Allergies  Allergen Reactions   Contrast Media [Iodinated Diagnostic Agents] Other (See Comments)    Cardiac arrest   Hydrocodone Other (See Comments)    Hallucinations   Codeine    Crestor [Rosuvastatin] Other (See Comments)    Myalgia    Lisinopril    Naproxen     constipation   Niaspan [Niacin] Other (See Comments)    flushing   Penicillins     Itchy.    Family History  Problem Relation Age of Onset   Alzheimer's disease Mother    CVA Mother    Alzheimer's disease Sister    Diabetes Sister    Hypertension Sister    Hypothyroidism  Sister    Congenital heart disease Sister    Prostate cancer Son    Coronary artery disease Daughter    Peptic Ulcer Other    Diabetes type II Other    Cystic fibrosis Grandson     Prior to Admission medications   Medication Sig Start Date End Date Taking? Authorizing Provider  acetaminophen (TYLENOL) 325 MG tablet Take 650 mg by mouth every 4 (four) hours as needed.    [provider]  amLODipine (NORVASC) 2.5 MG tablet TAKE ONE TABLET BY MOUTH EVERY DAY 09/03/20   Cox, Elnita Maxwell, MD  aspirin EC 81 MG tablet Take 81 mg by mouth daily. Swallow whole.    [provider]  Biotin 1000 MCG tablet Take 1,000 mcg by mouth daily.    [provider]   Blood Glucose Monitoring Suppl (ONE TOUCH ULTRA MINI) w/Device KIT Use daily to check blood sugar levels. E11.42 06/20/20   CoxElnita Maxwell, MD  calcium-vitamin D (OSCAL WITH D) 500-200 MG-UNIT tablet Take 2 tablets by mouth daily. For osteoporosis. Patient not taking: Reported on 12/12/2020    [provider]  cefdinir (OMNICEF) 300 MG capsule Take 1 capsule (300 mg total) by mouth 2 (two) times daily. 12/19/20   Cox, Elnita Maxwell, MD  celecoxib (CELEBREX) 200 MG capsule TAKE ONE CAPSULE BY MOUTH EVERY DAY 09/03/20   Cox, Elnita Maxwell, MD  cholecalciferol (VITAMIN D3) 25 MCG (1000 UNIT) tablet Take 2,000 Units by mouth daily.    [provider]  diclofenac Sodium (VOLTAREN) 1 % GEL Apply 1 application topically 2 (two) times daily.    [provider]  dicyclomine (BENTYL) 10 MG capsule Take 10 mg by mouth 3 (three) times daily as needed.    [provider]  furosemide (LASIX) 40 MG tablet Take 40 mg by mouth daily as needed for edema. Patient not taking: Reported on 12/12/2020    [provider]  glucose blood (ONETOUCH ULTRA) test strip CHECK BLOOD SUGAR EVERY DAY 12/17/20   Cox, Elnita Maxwell, MD  hydrocortisone (ANUSOL-HC) 2.5 % rectal cream Place 1 application rectally every 6 (six) hours as needed for hemorrhoids or anal itching. Patient not taking: Reported on 12/12/2020    [provider]  levothyroxine (SYNTHROID) 50 MCG tablet TAKE ONE TABLET BY MOUTH EVERY MORNING 30 MIN-1 HOUR BEFORE EATING WITH WATER 12/19/20   Cox, Kirsten, MD  losartan (COZAAR) 50 MG tablet Take 1 tablet (50 mg total) by mouth daily. Patient taking differently: Take 50 mg by mouth at bedtime. 10/31/20   Rip Harbour, NP  meclizine (ANTIVERT) 25 MG tablet Take 1 tablet by mouth 3 (three) times daily as needed for dizziness. 01/06/20   [provider]  metFORMIN (GLUCOPHAGE) 500 MG tablet Take 1 tablet (500 mg total) by mouth daily with breakfast. 07/23/20 10/21/20  CoxElnita Maxwell, MD   Milnacipran HCl (SAVELLA) 100 MG TABS tablet Take 1 tablet (100 mg total) by mouth 2 (two) times daily. 07/23/20   CoxElnita Maxwell, MD  Multiple Vitamin (MULTIVITAMIN) tablet Take 1 tablet by mouth daily.    [provider]  nitrofurantoin, macrocrystal-monohydrate, (MACROBID) 100 MG capsule Take 1 capsule (100 mg total) by mouth 2 (two) times daily. 12/24/20   CoxElnita Maxwell, MD  Omega-3 Fatty Acids (FISH OIL PO) Take 1 capsule by mouth daily. For cholesterol    [provider]  pantoprazole (PROTONIX) 40 MG tablet Take 1 tablet (40 mg total) by mouth daily. 12/17/20   Rochel Brome, MD  polyethylene  glycol (MIRALAX / GLYCOLAX) 17 g packet Take 17 g by mouth daily. Patient taking differently: Take 17 g by mouth daily as needed. 07/23/20   Cox, Elnita Maxwell, MD  pregabalin (LYRICA) 300 MG capsule TAKE ONE CAPSULE BY MOUTH 2 TIMES A DAY 09/20/20   Cox, Kirsten, MD  rosuvastatin (CRESTOR) 5 MG tablet TAKE ONE TABLET BY MOUTH EVERY DAY 12/05/20   Cox, Elnita Maxwell, MD  solifenacin (VESICARE) 10 MG tablet TAKE ONE TABLET BY MOUTH EVERY DAY 01/02/21   Cox, Elnita Maxwell, MD  traMADol (ULTRAM) 50 MG tablet Take 50 mg by mouth every 12 (twelve) hours as needed.    [provider]  vitamin B-12 (CYANOCOBALAMIN) 1000 MCG tablet Take 1,000 mcg by mouth daily.    [provider]    Physical Exam: Constitutional: Moderately built and nourished. Vitals:   06/21/21 0200 06/21/21 0230 06/21/21 0300 06/21/21 0330  BP: (!) 123/56 131/74 (!) 106/54 (!) 113/54  Pulse: 73 69 70 72  Resp: '13 15 20 16  ' Temp:      TempSrc:      SpO2: 100% 99% 99% 97%  Weight:      Height:       Eyes: Anicteric no pallor. ENMT: No discharge from the ears eyes nose and mouth. Neck: No mass felt.  No neck rigidity. Respiratory: No rhonchi or crepitations. Cardiovascular: S1-S2 heard. Abdomen: Soft nontender bowel sound present. Musculoskeletal: No edema. Skin: No rash. Neurologic: Alert awake oriented to her name.   Moving all extremities. Psychiatric: Appears normal.   Labs on Admission: I have personally reviewed following labs and imaging studies  CBC: Recent Labs  Lab 06/20/21 2208  WBC 13.3*  HGB 10.4*  HCT 32.8*  MCV 87.2  PLT 893   Basic Metabolic Panel: Recent Labs  Lab 06/20/21 2240  NA 135  K 3.2*  CL 101  CO2 21*  GLUCOSE 151*  BUN 19  CREATININE 1.11*  CALCIUM 8.6*   GFR: Estimated Creatinine Clearance: 37.8 mL/min (A) (by C-G formula based on SCr of 1.11 mg/dL (H)). Liver Function Tests: Recent Labs  Lab 06/20/21 2240  AST 18  ALT 22  ALKPHOS 92  BILITOT 0.4  PROT 6.2*  ALBUMIN 2.2*   No results for input(s): LIPASE, AMYLASE in the last 168 hours. No results for input(s): AMMONIA in the last 168 hours. Coagulation Profile: Recent Labs  Lab 06/20/21 2208  INR 1.7*   Cardiac Enzymes: No results for input(s): CKTOTAL, CKMB, CKMBINDEX, TROPONINI in the last 168 hours. BNP (last 3 results) No results for input(s): PROBNP in the last 8760 hours. HbA1C: No results for input(s): HGBA1C in the last 72 hours. CBG: No results for input(s): GLUCAP in the last 168 hours. Lipid Profile: No results for input(s): CHOL, HDL, LDLCALC, TRIG, CHOLHDL, LDLDIRECT in the last 72 hours. Thyroid Function Tests: No results for input(s): TSH, T4TOTAL, FREET4, T3FREE, THYROIDAB in the last 72 hours. Anemia Panel: No results for input(s): VITAMINB12, FOLATE, FERRITIN, TIBC, IRON, RETICCTPCT in the last 72 hours. Urine analysis:    Component Value Date/Time   COLORURINE RED (A) 06/20/2021 2350   APPEARANCEUR (A) 06/20/2021 2350    TEST NOT REPORTED DUE TO COLOR INTERFERENCE OF URINE PIGMENT   LABSPEC 1.015 06/20/2021 2350   PHURINE  06/20/2021 2350    TEST NOT REPORTED DUE TO COLOR INTERFERENCE OF URINE PIGMENT   GLUCOSEU (A) 06/20/2021 2350    TEST NOT REPORTED DUE TO COLOR INTERFERENCE OF URINE PIGMENT   HGBUR (A)  06/20/2021 2350    TEST NOT REPORTED DUE TO COLOR  INTERFERENCE OF URINE PIGMENT   BILIRUBINUR (A) 06/20/2021 2350    TEST NOT REPORTED DUE TO COLOR INTERFERENCE OF URINE PIGMENT   BILIRUBINUR negative 12/24/2020 1008   BILIRUBINUR neg 07/23/2020 1152   KETONESUR (A) 06/20/2021 2350    TEST NOT REPORTED DUE TO COLOR INTERFERENCE OF URINE PIGMENT   PROTEINUR (A) 06/20/2021 2350    TEST NOT REPORTED DUE TO COLOR INTERFERENCE OF URINE PIGMENT   UROBILINOGEN 0.2 12/24/2020 1008   NITRITE (A) 06/20/2021 2350    TEST NOT REPORTED DUE TO COLOR INTERFERENCE OF URINE PIGMENT   LEUKOCYTESUR (A) 06/20/2021 2350    TEST NOT REPORTED DUE TO COLOR INTERFERENCE OF URINE PIGMENT   Sepsis Labs: '@LABRCNTIP' (procalcitonin:4,lacticidven:4) ) Recent Results (from the past 240 hour(s))  Resp Panel by RT-PCR (Flu A&B, Covid) Nasopharyngeal Swab     Status: None   Collection Time: 06/21/21 12:56 AM   Specimen: Nasopharyngeal Swab; Nasopharyngeal(NP) swabs in vial transport medium  Result Value Ref Range Status   SARS Coronavirus 2 by RT PCR NEGATIVE NEGATIVE Final    Comment: (NOTE) SARS-CoV-2 target nucleic acids are NOT DETECTED.  The SARS-CoV-2 RNA is generally detectable in upper respiratory specimens during the acute phase of infection. The lowest concentration of SARS-CoV-2 viral copies this assay can detect is 138 copies/mL. A negative result does not preclude SARS-Cov-2 infection and should not be used as the sole basis for treatment or other patient management decisions. A negative result may occur with  improper specimen collection/handling, submission of specimen other than nasopharyngeal swab, presence of viral mutation(s) within the areas targeted by this assay, and inadequate number of viral copies(<138 copies/mL). A negative result must be combined with clinical observations, patient history, and epidemiological information. The expected result is Negative.  Fact Sheet for Patients:  EntrepreneurPulse.com.au  Fact  Sheet for Healthcare Providers:  IncredibleEmployment.be  This test is no t yet approved or cleared by the [place] FDA and  has been authorized for detection and/or diagnosis of SARS-CoV-2 by FDA under an Emergency Use Authorization (EUA). This EUA will remain  in effect (meaning this test can be used) for the duration of the COVID-19 declaration under Section 564(b)(1) of the Act, 21 U.S.C.section 360bbb-3(b)(1), unless the authorization is terminated  or revoked sooner.       Influenza A by PCR NEGATIVE NEGATIVE Final   Influenza B by PCR NEGATIVE NEGATIVE Final    Comment: (NOTE) The Xpert Xpress SARS-CoV-2/FLU/RSV plus assay is intended as an aid in the diagnosis of influenza from Nasopharyngeal swab specimens and should not be used as a sole basis for treatment. Nasal washings and aspirates are unacceptable for Xpert Xpress SARS-CoV-2/FLU/RSV testing.  Fact Sheet for Patients: EntrepreneurPulse.com.au  Fact Sheet for Healthcare Providers: IncredibleEmployment.be  This test is not yet approved or cleared by the [place] FDA and has been authorized for detection and/or diagnosis of SARS-CoV-2 by FDA under an Emergency Use Authorization (EUA). This EUA will remain in effect (meaning this test can be used) for the duration of the COVID-19 declaration under Section 564(b)(1) of the Act, 21 U.S.C. section 360bbb-3(b)(1), unless the authorization is terminated or revoked.  Performed at [facility], [place] [address] [place], [place] [address]      Radiological Exams on Admission: CT ABDOMEN PELVIS WO CONTRAST  Result Date: 06/21/2021 CLINICAL DATA:  Vaginal bleeding, possibly UTI, passage of vaginal clot EXAM: CT ABDOMEN AND PELVIS  WITHOUT CONTRAST TECHNIQUE: Multidetector CT imaging of the abdomen and pelvis was performed following the standard protocol without IV contrast. COMPARISON:   03/23/2021 FINDINGS: Lower chest: Bibasilar atelectasis and/or scarring. Lung bases are clear. Normal heart size. No pericardial effusion. Coronary artery atherosclerosis. Abundant mediastinal and epicardial fat. Atherosclerotic calcification of the thoracic aorta. Hepatobiliary: Pneumobilia and surgical changes noted in the region of the gallbladder fossa. No concerning focal parenchymal lesion. Normal hepatic attenuation. Smooth liver surface contour. No visible calcified gallstones. Pancreas: Pancreatic atrophy is similar to prior. No pancreatic ductal dilatation or surrounding inflammatory changes. Spleen: Normal in size. No concerning splenic lesions. Adrenals/Urinary Tract: Normal adrenal glands. Mild bilateral renal atrophy. Faint bilateral perinephric stranding and mild hydroureteronephrosis, left greater than right, with bilateral extrarenal pelves. No obstructive urolithiasis. Circumferential thickening of the urinary bladder. Heterogeneous material noted within the bladder lumen, suspect hemorrhagic products. Stomach/Bowel: Distal esophagus, stomach and duodenal sweep are unremarkable. No small bowel wall thickening or dilatation. No evidence of obstruction. Appendix is not visualized. No focal inflammation the vicinity of the cecum to suggest an occult appendicitis. No colonic dilatation or wall thickening. Scattered sigmoid colonic diverticula without focal inflammation to suggest diverticulitis. Vascular/Lymphatic: Atherosclerotic calcifications within the abdominal aorta and branch vessels. No aneurysm or ectasia. No enlarged abdominopelvic lymph nodes. Reproductive: Hysterectomy. No gross abnormality of the hysterectomy cuff. No worrisome adnexal lesion. Other: Fat containing ventral hernia with small surgical clip. Additional small umbilical and infraumbilical fat containing hernias as well. Postsurgical changes the anterior abdominal wall. Abdominal wall laxity and rectus diastasis. No free  abdominopelvic air or fluid. Musculoskeletal: Multilevel degenerative changes are present in the imaged portions of the spine. No acute osseous abnormality or suspicious osseous lesion. IMPRESSION: Circumferential thickening of the urinary bladder containing intermediate attenuation material, likely hemorrhagic products which would explain the patient's transvaginal passage of blood clots. Some mild hydroureteronephrosis is noted as well with faint perinephric stranding. Can reflect hemorrhagic cystitis with ascending tract infection. Correlate with urinalysis. Prior hysterectomy.  No gross abnormality of the vaginal cuff. Colonic diverticulosis without evidence of acute diverticulitis. Prior cholecystectomy and likely biliary manipulation with chronic pneumobilia. Aortic Atherosclerosis (ICD10-I70.0). Coronary artery atherosclerosis. Stable ventral fat containing hernias. Electronically Signed   By: Lovena Le M.D.   On: 06/21/2021 00:37   CT HEAD WO CONTRAST (5MM)  Result Date: 06/20/2021 CLINICAL DATA:  Altered mental status EXAM: CT HEAD WITHOUT CONTRAST TECHNIQUE: Contiguous axial images were obtained from the base of the skull through the vertex without intravenous contrast. COMPARISON:  03/23/2021 FINDINGS: Brain: No evidence of acute infarction, hemorrhage, hydrocephalus, extra-axial collection or mass lesion/mass effect. Subcortical white matter and periventricular small vessel ischemic changes. Vascular: No hyperdense vessel or unexpected calcification. Skull: Normal. Negative for fracture or focal lesion. Sinuses/Orbits: The visualized paranasal sinuses are essentially clear. The mastoid air cells are unopacified. Other: None. IMPRESSION: No evidence of acute intracranial abnormality. Small vessel ischemic changes. Electronically Signed   By: Julian Hy M.D.   On: 06/20/2021 23:28   DG Chest Portable 1 View  Result Date: 06/20/2021 CLINICAL DATA:  Shortness of breath EXAM: PORTABLE CHEST  1 VIEW COMPARISON:  03/23/2021 FINDINGS: Cardiomegaly. Aortic atherosclerosis. Left basilar atelectasis or infiltrate. No visible effusions or acute bony abnormality. IMPRESSION: Left base atelectasis or infiltrate. Electronically Signed   By: Rolm Baptise M.D.   On: 06/20/2021 22:52    EKG: Independently reviewed.  Normal sinus rhythm.  Assessment/Plan Principal Problem:   Hematuria Active Problems:   Essential hypertension, benign  Controlled type 2 diabetes mellitus with diabetic polyneuropathy, without long-term current use of insulin (HCC)   ABLA (acute blood loss anemia)    Hematuria in the setting of patient being on apixaban and urinary tract infection.  Patient was given Kcentra for reversal.  Patient is placed on Foley catheter and will continue with antibiotics.  Check serial CBC.  Will consult urologist if hematuria persists. Acute blood loss anemia hemoglobin has dropped by 3 g from the recent past.  Check serial CBCs. UTI on ceftriaxone.  Follow cultures. Hypertension we will hold Cozaar due to worsening creatinine from baseline.  Continue amlodipine and will keep patient as needed IV hydralazine. Diabetes mellitus type 2 on sliding scale coverage. Hypothyroidism on Synthroid. Acute encephalopathy -need to reassess in the morning.  CT head is unremarkable appears nonfocal.  Could be from urinary tract infection.   DVT prophylaxis: SCDs. Code Status: DNR as confirmed by ER physician with patient's family. Family Communication: Will need to discuss with family.  ER physician already discussed. Disposition Plan: Back to facility when stable. Consults called: We will need to consult urology if hematuria persists. Admission status: Observation.   Rise Patience MD [facility] Pager [phone].  If 7PM-7AM, please contact night-coverage [email or web address] Password [facility]  06/21/2021, 5:15 AM

## 2021-06-21 NOTE — Consult Note (Signed)
Urology Consult Note   Requesting Attending Physician:  Dessa Phi, DO Service Providing Consult: Urology  Consulting Attending: Link Snuffer, MD   Reason for Consult: Gross hematuria with clot retention  HPI: Cindy Gordon is seen in consultation for reasons noted above at the request of Dessa Phi, DO for evaluation of gross hematuria with clot retention.  This is a 80 y.o. female with past medical history of hypothyroidism, diabetes, fibromyalgia, GERD, hypertension who was recently hospitalized with COVID and discharged on Eliquis.  This hospitalization was about 3 weeks ago.  Became symptomatic with presumed UTI and was placed on antibiotics.  Since then has had hematuria and has come into the emergency room.  Upon my evaluation the emergency room had already placed a 22 French three-way Foley catheter and CBI had been started at a moderate drip with thin cherry urine output.  There was minimal red blood clot in a specimen at the patient's bedside.  Upon my evaluation the patient was hemodynamically stable.  Her hemoglobin is 8.3 today and was 10.4 yesterday evening.  Urine culture is in process.  Luminary blood cultures no growth.  Her white blood cell count is 14.7.  She is hypokalemic to 2.9.  Her creatinine is baseline at 1.07.  CT scan was obtained revealing material in the bladder concerning for blood clot as well as mild bilateral hydronephrosis down to the level of the bladder.  Patient does not have a significant urologic history that she was able to report at least.  No known history of pelvic radiation she has had a hysterectomy in the past.   Past Medical History: Past Medical History:  Diagnosis Date   Atrophy of thyroid (acquired)    Carpal tunnel syndrome    Chronic back pain    Diabetes mellitus without complication (HCC)    Fibromyalgia    Gastro-esophageal reflux disease without esophagitis    Hyperlipidemia    Hypertension    Irritable bowel syndrome     Localized edema    Localized swelling, mass and lump, neck    Neuromuscular dysfunction of bladder, unspecified    Obstructive sleep apnea (adult) (pediatric)    Other obesity    Other specified hypothyroidism    Sciatica, right side    Urge incontinence    Vitamin B 12 deficiency    Vitamin D deficiency     Past Surgical History:  Past Surgical History:  Procedure Laterality Date   APPENDECTOMY     BLADDER SUSPENSION     BREAST BIOPSY     right breast: benign   carpel tunnel repair Left 08/2014   CATARACT EXTRACTION  2019   right    CHOLECYSTECTOMY OPEN  1980   acute panceatitis:requiring removal of stones from pancreatitic duct   dobutamine nuclear stress test     12/2010   HYSTERECTOMY ABDOMINAL WITH SALPINGO-OOPHORECTOMY     LUMBAR LAMINECTOMY     RECTOCELE REPAIR  02/2011   REVERSE TOTAL SHOULDER ARTHROPLASTY  07/2017   TOTAL SHOULDER REPLACEMENT  2002   secondary to staph infection   VEIN LIGATION AND STRIPPING      Medication: Current Facility-Administered Medications  Medication Dose Route Frequency Provider Last Rate Last Admin   0.9 %  sodium chloride infusion   Intravenous Continuous Rise Patience, MD 75 mL/hr at 06/21/21 0555 New Bag at 06/21/21 0555   acetaminophen (TYLENOL) tablet 650 mg  650 mg Oral Q6H PRN Rise Patience, MD  Or   acetaminophen (TYLENOL) suppository 650 mg  650 mg Rectal Q6H PRN Rise Patience, MD       amLODipine (NORVASC) tablet 2.5 mg  2.5 mg Oral Daily Rise Patience, MD   2.5 mg at 06/21/21 1005   atorvastatin (LIPITOR) tablet 10 mg  10 mg Oral Daily Rise Patience, MD   10 mg at 06/21/21 1005   cefTRIAXone (ROCEPHIN) 1 g in sodium chloride 0.9 % 100 mL IVPB  1 g Intravenous Q24H Rise Patience, MD   Stopped at 06/21/21 0248   hydrALAZINE (APRESOLINE) injection 10 mg  10 mg Intravenous Q4H PRN Rise Patience, MD       insulin aspart (novoLOG) injection 0-9 Units  0-9 Units Subcutaneous  TID WC Rise Patience, MD       levothyroxine (SYNTHROID) tablet 75 mcg  75 mcg Oral Q0600 Rise Patience, MD   75 mcg at 06/21/21 0756   pantoprazole (PROTONIX) EC tablet 40 mg  40 mg Oral Daily Rise Patience, MD   40 mg at 06/21/21 1005   potassium chloride SA (KLOR-CON) CR tablet 40 mEq  40 mEq Oral Q4H Dessa Phi, DO   40 mEq at 06/21/21 1005   sodium chloride irrigation 0.9 % 3,000 mL  3,000 mL Irrigation Continuous Rise Patience, MD   3,000 mL at 06/21/21 4782   Current Outpatient Medications  Medication Sig Dispense Refill   amLODipine (NORVASC) 2.5 MG tablet TAKE ONE TABLET BY MOUTH EVERY DAY 90 tablet 1   aspirin EC 81 MG tablet Take 81 mg by mouth daily. Swallow whole.     atorvastatin (LIPITOR) 10 MG tablet Take 10 mg by mouth daily.     Biotin 1000 MCG tablet Take 1,000 mcg by mouth daily.     cefTRIAXone (ROCEPHIN) IVPB Inject 1 g into the vein daily. For 5 days for UTI     Cholecalciferol ([employer] VITAMIN D3) 250 MCG (10000 UT) CAPS Take 2 capsules by mouth daily. For 30 days for COVID     cholecalciferol (VITAMIN D3) 25 MCG (1000 UNIT) tablet Take 2,000 Units by mouth daily.     dexamethasone (DECADRON) 6 MG tablet Take 6 mg by mouth daily.     ELIQUIS 2.5 MG TABS tablet Take 2.5 mg by mouth 2 (two) times daily.     guaiFENesin (MUCINEX) 600 MG 12 hr tablet Take 600 mg by mouth 2 (two) times daily.     levothyroxine (SYNTHROID) 75 MCG tablet Take 75 mcg by mouth daily.     Milnacipran HCl (SAVELLA) 100 MG TABS tablet Take 1 tablet (100 mg total) by mouth 2 (two) times daily. 180 tablet 3   Multiple Vitamin (MULTIVITAMIN) tablet Take 1 tablet by mouth daily.     ondansetron (ZOFRAN) 4 MG tablet Take 4 mg by mouth every 6 (six) hours as needed for nausea or vomiting.     OxyCODONE HCl, Abuse Deter, (OXAYDO) 5 MG TABA Take 1 tablet by mouth every 6 (six) hours as needed (PAIN).     pantoprazole (PROTONIX) 40 MG tablet Take 1 tablet (40 mg total) by mouth  daily. 90 tablet 0   pregabalin (LYRICA) 300 MG capsule TAKE ONE CAPSULE BY MOUTH 2 TIMES A DAY 180 capsule 1   traMADol (ULTRAM) 50 MG tablet Take 50 mg by mouth every 12 (twelve) hours as needed.     vitamin B-12 (CYANOCOBALAMIN) 1000 MCG tablet Take 1,000 mcg by mouth daily.  vitamin C (ASCORBIC ACID) 500 MG tablet Take 500 mg by mouth 2 (two) times daily.     Blood Glucose Monitoring Suppl (ONE TOUCH ULTRA MINI) w/Device KIT Use daily to check blood sugar levels. E11.42 1 kit 0   glucose blood (ONETOUCH ULTRA) test strip CHECK BLOOD SUGAR EVERY DAY 100 each 3    Allergies: Allergies  Allergen Reactions   Contrast Media [Iodinated Diagnostic Agents] Other (See Comments)    Cardiac arrest   Hydrocodone Other (See Comments)    Hallucinations   Codeine    Crestor [Rosuvastatin] Other (See Comments)    Myalgia    Lisinopril    Naproxen     constipation   Niaspan [Niacin] Other (See Comments)    flushing   Penicillins     Itchy.    Social History: Social History   Tobacco Use   Smoking status: Never   Smokeless tobacco: Never  Vaping Use   Vaping Use: Never used  Substance Use Topics   Alcohol use: Never   Drug use: Never    Family History Family History  Problem Relation Age of Onset   Alzheimer's disease Mother    CVA Mother    Alzheimer's disease Sister    Diabetes Sister    Hypertension Sister    Hypothyroidism Sister    Congenital heart disease Sister    Prostate cancer Son    Coronary artery disease Daughter    Peptic Ulcer Other    Diabetes type II Other    Cystic fibrosis Grandson     Review of Systems 10 systems were reviewed and are negative except as noted specifically in the HPI.  Objective   Vital signs in last 24 hours: BP 118/64   Pulse 73   Temp 98.1 F (36.7 C) (Oral)   Resp 17   Ht _0  (1.575 m)   Wt 78 kg   SpO2 98%   BMI 31.45 kg/m   Physical Exam General: NAD, A&O, resting, appropriate HEENT: [place]/AT, EOMI,  MMM Pulmonary: Normal work of breathing Cardiovascular: HDS, adequate peripheral perfusion Abdomen: Soft, NTTP, nondistended. GU: Three-way Foley catheter in place draining light thin cherry urine output on a moderate drip CBI Extremities: warm and well perfused Neuro: Appropriate, no focal neurological deficits  Most Recent Labs: Lab Results  Component Value Date   WBC 15.2 (H) 06/21/2021   HGB 8.3 (L) 06/21/2021   HCT 26.6 (L) 06/21/2021   PLT 205 06/21/2021    Lab Results  Component Value Date   NA 135 06/21/2021   K 2.9 (L) 06/21/2021   CL 102 06/21/2021   CO2 21 (L) 06/21/2021   BUN 18 06/21/2021   CREATININE 1.07 (H) 06/21/2021   CALCIUM 8.4 (L) 06/21/2021    Lab Results  Component Value Date   INR 1.7 (H) 06/20/2021   APTT 40 (H) 06/20/2021     Urine Culture: _1 (laburin,org,r9620,r9621)@   IMAGING: CT ABDOMEN PELVIS WO CONTRAST  Result Date: 06/21/2021 CLINICAL DATA:  Vaginal bleeding, possibly UTI, passage of vaginal clot EXAM: CT ABDOMEN AND PELVIS WITHOUT CONTRAST TECHNIQUE: Multidetector CT imaging of the abdomen and pelvis was performed following the standard protocol without IV contrast. COMPARISON:  03/23/2021 FINDINGS: Lower chest: Bibasilar atelectasis and/or scarring. Lung bases are clear. Normal heart size. No pericardial effusion. Coronary artery atherosclerosis. Abundant mediastinal and epicardial fat. Atherosclerotic calcification of the thoracic aorta. Hepatobiliary: Pneumobilia and surgical changes noted in the region of the gallbladder fossa. No concerning focal parenchymal lesion. Normal  hepatic attenuation. Smooth liver surface contour. No visible calcified gallstones. Pancreas: Pancreatic atrophy is similar to prior. No pancreatic ductal dilatation or surrounding inflammatory changes. Spleen: Normal in size. No concerning splenic lesions. Adrenals/Urinary Tract: Normal adrenal glands. Mild bilateral renal atrophy. Faint bilateral  perinephric stranding and mild hydroureteronephrosis, left greater than right, with bilateral extrarenal pelves. No obstructive urolithiasis. Circumferential thickening of the urinary bladder. Heterogeneous material noted within the bladder lumen, suspect hemorrhagic products. Stomach/Bowel: Distal esophagus, stomach and duodenal sweep are unremarkable. No small bowel wall thickening or dilatation. No evidence of obstruction. Appendix is not visualized. No focal inflammation the vicinity of the cecum to suggest an occult appendicitis. No colonic dilatation or wall thickening. Scattered sigmoid colonic diverticula without focal inflammation to suggest diverticulitis. Vascular/Lymphatic: Atherosclerotic calcifications within the abdominal aorta and branch vessels. No aneurysm or ectasia. No enlarged abdominopelvic lymph nodes. Reproductive: Hysterectomy. No gross abnormality of the hysterectomy cuff. No worrisome adnexal lesion. Other: Fat containing ventral hernia with small surgical clip. Additional small umbilical and infraumbilical fat containing hernias as well. Postsurgical changes the anterior abdominal wall. Abdominal wall laxity and rectus diastasis. No free abdominopelvic air or fluid. Musculoskeletal: Multilevel degenerative changes are present in the imaged portions of the spine. No acute osseous abnormality or suspicious osseous lesion. IMPRESSION: Circumferential thickening of the urinary bladder containing intermediate attenuation material, likely hemorrhagic products which would explain the patient's transvaginal passage of blood clots. Some mild hydroureteronephrosis is noted as well with faint perinephric stranding. Can reflect hemorrhagic cystitis with ascending tract infection. Correlate with urinalysis. Prior hysterectomy.  No gross abnormality of the vaginal cuff. Colonic diverticulosis without evidence of acute diverticulitis. Prior cholecystectomy and likely biliary manipulation with chronic  pneumobilia. Aortic Atherosclerosis (ICD10-I70.0). Coronary artery atherosclerosis. Stable ventral fat containing hernias. Electronically Signed   By: Lovena Le M.D.   On: 06/21/2021 00:37   CT HEAD WO CONTRAST (5MM)  Result Date: 06/20/2021 CLINICAL DATA:  Altered mental status EXAM: CT HEAD WITHOUT CONTRAST TECHNIQUE: Contiguous axial images were obtained from the base of the skull through the vertex without intravenous contrast. COMPARISON:  03/23/2021 FINDINGS: Brain: No evidence of acute infarction, hemorrhage, hydrocephalus, extra-axial collection or mass lesion/mass effect. Subcortical white matter and periventricular small vessel ischemic changes. Vascular: No hyperdense vessel or unexpected calcification. Skull: Normal. Negative for fracture or focal lesion. Sinuses/Orbits: The visualized paranasal sinuses are essentially clear. The mastoid air cells are unopacified. Other: None. IMPRESSION: No evidence of acute intracranial abnormality. Small vessel ischemic changes. Electronically Signed   By: Julian Hy M.D.   On: 06/20/2021 23:28   DG Chest Portable 1 View  Result Date: 06/20/2021 CLINICAL DATA:  Shortness of breath EXAM: PORTABLE CHEST 1 VIEW COMPARISON:  03/23/2021 FINDINGS: Cardiomegaly. Aortic atherosclerosis. Left basilar atelectasis or infiltrate. No visible effusions or acute bony abnormality. IMPRESSION: Left base atelectasis or infiltrate. Electronically Signed   By: Rolm Baptise M.D.   On: 06/20/2021 22:52    ------  Assessment:  80 y.o. female with past medical history of hypothyroidism, fibromyalgia, hyperlipidemia hypertension who was recently hospitalized with COVID and started on anticoagulation with Eliquis.  Presumed recent UTI which may have exacerbated gross hematuria for which she presented to the emergency room.  She is currently hemodynamically stable.  Her hemoglobin has down trended over the course of the past day from 10.4-8.3.  I manually irrigated the  catheter at the bedside achieving about 150 cc of blood clot return.  CBI was restarted on a moderate drip with thin  light cherry output.  We recommend for continuation of continuous bladder irrigation with intermittent manual irrigation.  Weaning of CBI to achieve light pink output.  Recommend holding anticoagulation at this point as the risk of bleeding acutely and injury to the urinary system outweigh the benefit of clot prophylaxis.  Also recommend continuing antibiotics and weaning per her urine culture data.  Continue to trend hemoglobins and please make the patient n.p.o. at midnight in the event that the bleeding worsens and the patient requires a procedure.  Hopefully with ceasing anticoagulation and irrigating the clot, the hematuria will abate.   Recommendations: -Continue CBI and wean to light pink urine output. -If the catheter stops draining acutely, please stop the CBI and manually flush the catheter with 60 cc of sterile saline with a [place] syringe.  If this does not result in clearing the catheter to drain, please page the urology pager. -Continue to trend hemoglobins -Treat UTI with antibiotics tailored per her culture data for complicated UTI course -Please make the patient n.p.o. at midnight just in case a procedure is warranted. -We will continue to evaluate and follow along -Once hematuria is cleared, will discuss with patient and family proceeding with gross hematuria work-up likely as an outpatient.  Thank you for this consult. Please contact the urology consult pager with any further questions/concerns.

## 2021-06-21 NOTE — ED Notes (Signed)
Urinary cathter is drainage blood-tinged urine, dark red in color. Patients was incontinent of stool and had some vaginal bleeding. Linens and diaper were changed. Patient is alert to herself and able to answer some questions. She is soft spoken.

## 2021-06-21 NOTE — ED Notes (Signed)
Continuous bladder irrigation started. Urine is dark red, blood tinged. Foley was irrigated to remove clots.

## 2021-06-21 NOTE — Progress Notes (Signed)
  PROGRESS NOTE  Patient admitted earlier this morning. See H&P.   Cindy Gordon is a 80 y.o. female with history of hypertension, diabetes mellitus, hypothyroidism per report was recently diagnosed about 3 weeks ago with COVID infection at that time per the nursing home protocol was placed on apixaban. She was brought to the emergency department with concerns for hematuria. CT abdomen pelvis done shows features concerning for clot in the urinary bladder and also interstitial cystitis findings and ascending infection. Patient was treated antibiotics and patient also was given Kcentra, started on continuous bladder irrigation.  On examination, patient is resting comfortably in bed with continuous bladder irrigation ongoing with Foley catheter in place, Foley bag with bright red hematuria.  Hold Eliquis Urology consulted today, spoke with Dr. Gloriann Loan Continue to monitor CBC Continue Rocephin Replace potassium   Status is: Observation  The patient will require care spanning > 2 midnights and should be moved to inpatient because: Inpatient level of care appropriate due to severity of illness  Dispo: The patient is from: SNF              Anticipated d/c is to: SNF              Patient currently is not medically stable to d/c.   Difficult to place patient No       Dessa Phi, DO [facility] 06/21/2021, 10:32 AM  Available via Epic secure chat 7am-7pm After these hours, please refer to coverage provider listed on [email or web address]

## 2021-06-22 LAB — BASIC METABOLIC PANEL
Anion gap: 11 (ref 5–15)
BUN: 16 mg/dL (ref 8–23)
CO2: 19 mmol/L — ABNORMAL LOW (ref 22–32)
Calcium: 8 mg/dL — ABNORMAL LOW (ref 8.9–10.3)
Chloride: 106 mmol/L (ref 98–111)
Creatinine, Ser: 0.9 mg/dL (ref 0.44–1.00)
GFR, Estimated: >60 mL/min (ref >60)
Glucose, Bld: 100 mg/dL — ABNORMAL HIGH (ref 70–99)
Potassium: 3.2 mmol/L — ABNORMAL LOW (ref 3.5–5.1)
Sodium: 136 mmol/L (ref 135–145)

## 2021-06-22 LAB — CBC
HCT: 26.8 % — ABNORMAL LOW (ref 36.0–46.0)
Hemoglobin: 8.5 g/dL — ABNORMAL LOW (ref 12.0–15.0)
MCH: 28.1 pg (ref 26.0–34.0)
MCHC: 31.7 g/dL (ref 30.0–36.0)
MCV: 88.7 fL (ref 80.0–100.0)
Platelets: 195 10*3/uL (ref 150–400)
RBC: 3.02 MIL/uL — ABNORMAL LOW (ref 3.87–5.11)
RDW: 18.2 % — ABNORMAL HIGH (ref 11.5–15.5)
WBC: 19.5 10*3/uL — ABNORMAL HIGH (ref 4.0–10.5)
nRBC: 0 % (ref 0.0–0.2)

## 2021-06-22 LAB — GLUCOSE, CAPILLARY
Glucose-Capillary: 99 mg/dL (ref 70–99)
Glucose-Capillary: 87 mg/dL (ref 70–99)
Glucose-Capillary: 124 mg/dL — ABNORMAL HIGH (ref 70–99)
Glucose-Capillary: 96 mg/dL (ref 70–99)

## 2021-06-22 MED ORDER — POTASSIUM CHLORIDE CRYS ER 20 MEQ PO TBCR
40.0000 meq | EXTENDED_RELEASE_TABLET | ORAL | Status: AC
  Administered 2021-06-22 (×2): 40 meq via ORAL
  Filled 2021-06-22 (×2): qty 2

## 2021-06-22 MED ORDER — AMOXICILLIN 250 MG PO CAPS
500.0000 mg | ORAL_CAPSULE | Freq: Three times a day (TID) | ORAL | Status: DC
  Administered 2021-06-22 – 2021-06-25 (×10): 500 mg via ORAL
  Filled 2021-06-22 (×10): qty 2

## 2021-06-22 MED ORDER — DIPHENHYDRAMINE HCL 50 MG/ML IJ SOLN: 12.5000 mg | Freq: Three times a day (TID) | INTRAMUSCULAR | Status: DC | PRN

## 2021-06-22 NOTE — NC FL2 (Signed)
[place] LEVEL OF CARE SCREENING TOOL     IDENTIFICATION  Patient Name: Cindy Gordon Birthdate: 10-Jul-1941 Sex: female Admission Date (Current Location): 06/20/2021  [facility] and [place] Number:  Herbalist and Address:  [facility],  [place], [place]      Provider Number: [id number]  Attending Physician Name and Address:  Dessa Phi, DO  Relative Name and Phone Number:  CANDUS, BRAUD [phone]  [phone]    Current Level of Care: Hospital Recommended Level of Care: [place] Prior Approval Number:    Date Approved/Denied:   PASRR Number: [id number]  Discharge Plan: SNF    Current Diagnoses: Patient Active Problem List   Diagnosis Date Noted   Hematuria 06/21/2021   ABLA (acute blood loss anemia) 06/21/2021   Pressure injury of skin 06/21/2021   Urge incontinence 07/23/2020   Mixed hyperlipidemia 03/15/2020   Controlled type 2 diabetes mellitus with diabetic polyneuropathy, without long-term current use of insulin ([place]) 03/15/2020   Atrophy of thyroid 03/15/2020   Fibromyalgia 03/15/2020   Gastroesophageal reflux disease without esophagitis 03/15/2020   OSA (obstructive sleep apnea) 03/15/2020   Class 1 obesity due to excess calories with serious comorbidity and body mass index (BMI) of 32.0 to 32.9 in adult 03/15/2020   Essential hypertension, benign 01/23/2020   Acute cystitis without hematuria 01/23/2020   Vertigo 01/23/2020   Fall on same level 01/23/2020   Abrasion of right arm 01/23/2020    Orientation RESPIRATION BLADDER Height & Weight     Self, Place  Normal Incontinent Weight: 78 kg Height:  5\' 2"  (157.5 cm)  BEHAVIORAL SYMPTOMS/MOOD NEUROLOGICAL BOWEL NUTRITION STATUS      Incontinent Diet (Carb Modified)  AMBULATORY STATUS COMMUNICATION OF NEEDS Skin   Total Care Verbally Other (Comment) (stage 2 on sacrum)                       Personal Care  Assistance Level of Assistance  Bathing, Feeding, Dressing, Total care Bathing Assistance: Maximum assistance Feeding assistance: Maximum assistance Dressing Assistance: Maximum assistance Total Care Assistance: Maximum assistance   Functional Limitations Info  Sight, Hearing, Speech Sight Info: Adequate Hearing Info: Adequate Speech Info: Adequate    SPECIAL CARE FACTORS FREQUENCY  PT (By licensed PT), OT (By licensed OT)     PT Frequency: Eval and Treat OT Frequency: Eval and Treat            Contractures Contractures Info: Not present    Additional Factors Info  Code Status Code Status Info: DNR             Current Medications (06/22/2021):  This is the current hospital active medication list Current Facility-Administered Medications  Medication Dose Route Frequency Provider Last Rate Last Admin   acetaminophen (TYLENOL) tablet 650 mg  650 mg Oral Q6H PRN Rise Patience, MD       Or   acetaminophen (TYLENOL) suppository 650 mg  650 mg Rectal Q6H PRN Rise Patience, MD       amLODipine (NORVASC) tablet 2.5 mg  2.5 mg Oral Daily Rise Patience, MD   2.5 mg at 06/22/21 8676   amoxicillin (AMOXIL) capsule 500 mg  500 mg Oral Q8H Dessa Phi, DO       atorvastatin (LIPITOR) tablet 10 mg  10 mg Oral Daily Rise Patience, MD   10 mg at 06/22/21 0827   Chlorhexidine Gluconate Cloth 2 % PADS 6  each  6 each Topical Daily Dessa Phi, DO   6 each at 06/22/21 2536   diphenhydrAMINE (BENADRYL) injection 12.5 mg  12.5 mg Intravenous Q8H PRN Dessa Phi, DO       hydrALAZINE (APRESOLINE) injection 10 mg  10 mg Intravenous Q4H PRN Rise Patience, MD       insulin aspart (novoLOG) injection 0-9 Units  0-9 Units Subcutaneous TID WC Rise Patience, MD       levothyroxine (SYNTHROID) tablet 75 mcg  75 mcg Oral Q0600 Rise Patience, MD   75 mcg at 06/22/21 0537   pantoprazole (PROTONIX) EC tablet 40 mg  40 mg Oral Daily Rise Patience, MD   40 mg at 06/22/21 0827   potassium chloride SA (KLOR-CON) CR tablet 40 mEq  40 mEq Oral Q4H Dessa Phi, DO   40 mEq at 06/22/21 6440   sodium chloride irrigation 0.9 % 3,000 mL  3,000 mL Irrigation Continuous Rise Patience, MD   3,000 mL at 06/22/21 3474     Discharge Medications: Please see discharge summary for a list of discharge medications.  Relevant Imaging Results:  Relevant Lab Results:   Additional Information SS#[id number]  Purcell Mouton, RN

## 2021-06-22 NOTE — Progress Notes (Addendum)
Urology Progress Note   80 y.o. female with past medical history of hypothyroidism, diabetes, fibromyalgia, GERD, hypertension who was recently hospitalized with COVID and discharged on Eliquis.  Presents with clot retention, gross hematuria.  Subjective: NAEON.  CBI was weaned to a very slow drip in her urine has remained clear thin and without clots.  Hemoglobin stable at 8.5 this morning.  Creatinine 0.9.  She does have an uptrending white count to 19.5.  Objective: Vital signs in last 24 hours: Temp:  [97.4 F (36.3 C)-97.7 F (36.5 C)] 97.5 F (36.4 C) (08/13 0602) Pulse Rate:  [73-84] 79 (08/13 0832) Resp:  [9-18] 18 (08/13 0602) BP: (100-140)/(50-76) 114/50 (08/13 0832) SpO2:  [98 %-100 %] 100 % (08/13 0602)  Intake/Output from previous day: 08/12 0701 - 08/13 0700 In: 7928 [P.O.:120; I.V.:1708; IV Piggyback:100] Out: 9850 [Urine:6250] Intake/Output this shift: Total I/O In: 3000 [Other:3000] Out: 2500 [Urine:2500]  Physical Exam:  General: Alert and oriented CV: Regular rate Lungs: No increased work of breathing Abdomen: Soft, nontender nondistended GU: Foley in place draining clear light pink urine with CBI clamped. Ext: NT, No erythema  Lab Results: Recent Labs    06/21/21 0552 06/21/21 0939 06/22/21 0553  HGB 8.4* 8.3* 8.5*  HCT 26.8* 26.6* 26.8*   Recent Labs    06/21/21 0552 06/22/21 0553  NA 135 136  K 2.9* 3.2*  CL 102 106  CO2 21* 19*  GLUCOSE 133* 100*  BUN 18 16  CREATININE 1.07* 0.90  CALCIUM 8.4* 8.0*    Studies/Results: CT ABDOMEN PELVIS WO CONTRAST  Result Date: 06/21/2021 CLINICAL DATA:  Vaginal bleeding, possibly UTI, passage of vaginal clot EXAM: CT ABDOMEN AND PELVIS WITHOUT CONTRAST TECHNIQUE: Multidetector CT imaging of the abdomen and pelvis was performed following the standard protocol without IV contrast. COMPARISON:  03/23/2021 FINDINGS: Lower chest: Bibasilar atelectasis and/or scarring. Lung bases are clear. Normal heart  size. No pericardial effusion. Coronary artery atherosclerosis. Abundant mediastinal and epicardial fat. Atherosclerotic calcification of the thoracic aorta. Hepatobiliary: Pneumobilia and surgical changes noted in the region of the gallbladder fossa. No concerning focal parenchymal lesion. Normal hepatic attenuation. Smooth liver surface contour. No visible calcified gallstones. Pancreas: Pancreatic atrophy is similar to prior. No pancreatic ductal dilatation or surrounding inflammatory changes. Spleen: Normal in size. No concerning splenic lesions. Adrenals/Urinary Tract: Normal adrenal glands. Mild bilateral renal atrophy. Faint bilateral perinephric stranding and mild hydroureteronephrosis, left greater than right, with bilateral extrarenal pelves. No obstructive urolithiasis. Circumferential thickening of the urinary bladder. Heterogeneous material noted within the bladder lumen, suspect hemorrhagic products. Stomach/Bowel: Distal esophagus, stomach and duodenal sweep are unremarkable. No small bowel wall thickening or dilatation. No evidence of obstruction. Appendix is not visualized. No focal inflammation the vicinity of the cecum to suggest an occult appendicitis. No colonic dilatation or wall thickening. Scattered sigmoid colonic diverticula without focal inflammation to suggest diverticulitis. Vascular/Lymphatic: Atherosclerotic calcifications within the abdominal aorta and branch vessels. No aneurysm or ectasia. No enlarged abdominopelvic lymph nodes. Reproductive: Hysterectomy. No gross abnormality of the hysterectomy cuff. No worrisome adnexal lesion. Other: Fat containing ventral hernia with small surgical clip. Additional small umbilical and infraumbilical fat containing hernias as well. Postsurgical changes the anterior abdominal wall. Abdominal wall laxity and rectus diastasis. No free abdominopelvic air or fluid. Musculoskeletal: Multilevel degenerative changes are present in the imaged portions of  the spine. No acute osseous abnormality or suspicious osseous lesion. IMPRESSION: Circumferential thickening of the urinary bladder containing intermediate attenuation material, likely hemorrhagic products which would  explain the patient's transvaginal passage of blood clots. Some mild hydroureteronephrosis is noted as well with faint perinephric stranding. Can reflect hemorrhagic cystitis with ascending tract infection. Correlate with urinalysis. Prior hysterectomy.  No gross abnormality of the vaginal cuff. Colonic diverticulosis without evidence of acute diverticulitis. Prior cholecystectomy and likely biliary manipulation with chronic pneumobilia. Aortic Atherosclerosis (ICD10-I70.0). Coronary artery atherosclerosis. Stable ventral fat containing hernias. Electronically Signed   By: Lovena Le M.D.   On: 06/21/2021 00:37   CT HEAD WO CONTRAST (5MM)  Result Date: 06/20/2021 CLINICAL DATA:  Altered mental status EXAM: CT HEAD WITHOUT CONTRAST TECHNIQUE: Contiguous axial images were obtained from the base of the skull through the vertex without intravenous contrast. COMPARISON:  03/23/2021 FINDINGS: Brain: No evidence of acute infarction, hemorrhage, hydrocephalus, extra-axial collection or mass lesion/mass effect. Subcortical white matter and periventricular small vessel ischemic changes. Vascular: No hyperdense vessel or unexpected calcification. Skull: Normal. Negative for fracture or focal lesion. Sinuses/Orbits: The visualized paranasal sinuses are essentially clear. The mastoid air cells are unopacified. Other: None. IMPRESSION: No evidence of acute intracranial abnormality. Small vessel ischemic changes. Electronically Signed   By: Julian Hy M.D.   On: 06/20/2021 23:28   DG Chest Portable 1 View  Result Date: 06/20/2021 CLINICAL DATA:  Shortness of breath EXAM: PORTABLE CHEST 1 VIEW COMPARISON:  03/23/2021 FINDINGS: Cardiomegaly. Aortic atherosclerosis. Left basilar atelectasis or  infiltrate. No visible effusions or acute bony abnormality. IMPRESSION: Left base atelectasis or infiltrate. Electronically Signed   By: Rolm Baptise M.D.   On: 06/20/2021 22:52    Assessment/Plan:  80 y.o. female with past medical history of hypothyroidism, diabetes, fibromyalgia, GERD, hypertension who was recently hospitalized with COVID and discharged on Eliquis.  Presents with clot retention, gross hematuria.  CBI has been clamped at the bedside this morning.  Her urine output is very light pink, without clots.  We will keep the CBI clamped.  I do recommend treating her empirically for UTI and following up per urine culture data.  Especially in the setting of her uptrending white blood cell count however this could be related to significant lower urinary tract manipulation.  Recommend keeping the Foley catheter in place for 1 week in the setting of bladder stretch injury.  We will arrange for outpatient trial of void and gross hematuria work-up including cystoscopy.  -CBI clamped at bedside.  Keep CBI clamped. -Continue Foley catheter to drainage -Continue empiric antibiotics tailored per urine culture data, currently on ceftriaxone -Appreciate primary team's care for this patient -We will arrange for outpatient follow-up for voiding trial in about 1 week.  She will need cystoscopy on an outpatient basis.  Dispo: Per primary team   LOS: 1 day

## 2021-06-22 NOTE — Evaluation (Signed)
Clinical/Bedside Swallow Evaluation Patient Details  Name: Cindy Gordon MRN: [id number] Date of Birth: [birth year]  Today's Date: 06/22/2021 Time: SLP Start Time (ACUTE ONLY): 5397 SLP Stop Time (ACUTE ONLY): 6734 SLP Time Calculation (min) (ACUTE ONLY): 15 min  Past Medical History:  Past Medical History:  Diagnosis Date   Atrophy of thyroid (acquired)    Carpal tunnel syndrome    Chronic back pain    Diabetes mellitus without complication (HCC)    Fibromyalgia    Gastro-esophageal reflux disease without esophagitis    Hyperlipidemia    Hypertension    Irritable bowel syndrome    Localized edema    Localized swelling, mass and lump, neck    Neuromuscular dysfunction of bladder, unspecified    Obstructive sleep apnea (adult) (pediatric)    Other obesity    Other specified hypothyroidism    Sciatica, right side    Urge incontinence    Vitamin B 12 deficiency    Vitamin D deficiency    Past Surgical History:  Past Surgical History:  Procedure Laterality Date   APPENDECTOMY     BLADDER SUSPENSION     BREAST BIOPSY     right breast: benign   carpel tunnel repair Left 08/2014   CATARACT EXTRACTION  2019   right    CHOLECYSTECTOMY OPEN  1980   acute panceatitis:requiring removal of stones from pancreatitic duct   dobutamine nuclear stress test     12/2010   HYSTERECTOMY ABDOMINAL WITH SALPINGO-OOPHORECTOMY     LUMBAR LAMINECTOMY     RECTOCELE REPAIR  02/2011   REVERSE TOTAL SHOULDER ARTHROPLASTY  07/2017   TOTAL SHOULDER REPLACEMENT  2002   secondary to staph infection   VEIN LIGATION AND STRIPPING     HPI:  Patient is an 80 y.o. female with PMH:  hypertension, diabetes mellitus, hypothyroidism per report was recently diagnosed about 3 weeks ago with COVID infection at that time per the nursing home protocol was placed on apixaban. She presented to ER with increased vaginal bleeding. In ER, CT head was unremarkable, CT abdomen pelvis done shows features concerning for  clot in the urinary bladder and also interstitial cystitis findings and ascending infection. COVID test negative. Patient admitted for further management of hematuria and possible developing UTI. BSE ordered due to patient observed with difficulty with liquids and solids.   Assessment / Plan / Recommendation Clinical Impression  Patient presents with a mild-moderate oropharyngeal dysphagia with likely impact from current state of lethargy and fatigue. She exhibited slow oral motor movements, leading to oral transit delay of liquids and solids, and exhibited suspected swallow initiation delay. No overt aspiration or penetration observed. SLP recommended downgrading solid textures from regular to Dys 2 solids. SLP Visit Diagnosis: Dysphagia, unspecified (R13.10)    Aspiration Risk  Mild aspiration risk    Diet Recommendation Dysphagia 2 (Fine chop);Thin liquid   Liquid Administration via: Straw;Cup Medication Administration: Whole meds with puree Supervision: Full supervision/cueing for compensatory strategies;Staff to assist with self feeding Compensations: Minimize environmental distractions;Small sips/bites;Slow rate Postural Changes: Seated upright at 90 degrees    Other  Recommendations Oral Care Recommendations: Oral care BID;Staff/trained caregiver to provide oral care   Follow up Recommendations        Frequency and Duration min 1 x/week  1 week       Prognosis Prognosis for Safe Diet Advancement: Good      Swallow Study   General Date of Onset: 06/21/21 HPI: Patient is an 81 y.o. female with PMH:  hypertension, diabetes mellitus, hypothyroidism per report was recently diagnosed about 3 weeks ago with COVID infection at that time per the nursing home protocol was placed on apixaban. She presented to ER with increased vaginal bleeding. In ER, CT head was unremarkable, CT abdomen pelvis done shows features concerning for clot in the urinary bladder and also interstitial cystitis  findings and ascending infection. COVID test negative. Patient admitted for further management of hematuria and possible developing UTI. BSE ordered due to patient observed with difficulty with liquids and solids. Type of Study: Bedside Swallow Evaluation Previous Swallow Assessment: none found Diet Prior to this Study: Regular;Thin liquids Temperature Spikes Noted: No Respiratory Status: Room air History of Recent Intubation: No Behavior/Cognition: Lethargic/Drowsy;Alert;Cooperative Oral Cavity Assessment: Within Functional Limits Oral Care Completed by SLP: Yes Oral Cavity - Dentition: Missing dentition Self-Feeding Abilities: Needs assist;Total assist Patient Positioning: Upright in bed Baseline Vocal Quality: Low vocal intensity Volitional Cough: Weak Volitional Swallow: Unable to elicit    Oral/Motor/Sensory Function Overall Oral Motor/Sensory Function: Generalized oral weakness (patient fatigued/lethargic with slow oral motor movements) Facial ROM: Within Functional Limits Facial Symmetry: Within Functional Limits Lingual ROM: Within Functional Limits Lingual Symmetry: Within Functional Limits   Ice Chips     Thin Liquid Thin Liquid: Impaired Presentation: Straw Oral Phase Functional Implications: Prolonged oral transit Pharyngeal  Phase Impairments: Suspected delayed Swallow    Nectar Thick     Honey Thick     Puree Puree: Impaired Oral Phase Functional Implications: Prolonged oral transit Pharyngeal Phase Impairments: Suspected delayed Swallow   Solid     Solid: Not tested      Sonia Baller, MA, CCC-SLP Speech Therapy

## 2021-06-22 NOTE — Progress Notes (Addendum)
PROGRESS NOTE    Cindy Gordon  JQZ:[id number] DOB: October 13, 1941 DOA: 06/20/2021 PCP: Rochel Brome, MD     Brief Narrative:  Cindy Gordon is a 80 y.o. female with history of hypertension, diabetes mellitus, hypothyroidism per report was recently diagnosed about 3 weeks ago with COVID infection at that time per the nursing home protocol was placed on apixaban. She was brought to the emergency department with concerns for hematuria. CT abdomen pelvis done shows features concerning for clot in the urinary bladder and also interstitial cystitis findings and ascending infection. Patient was treated antibiotics and patient also was given Kcentra, started on continuous bladder irrigation. Urology was consulted.    New events last 24 hours / Subjective: Patient complains of being thirsty, wants something to drink.  No other new complaints.  Foley catheter is in place with light pink tinged urine  Assessment & Plan:   Principal Problem:   Hematuria Active Problems:   Essential hypertension, benign   Controlled type 2 diabetes mellitus with diabetic polyneuropathy, without long-term current use of insulin (HCC)   ABLA (acute blood loss anemia)   Pressure injury of skin   Hematuria -Holding Eliquis, doubt she needs to resume this.  Eliquis was started as outpatient for VTE prophylaxis in setting of COVID infection several weeks ago -Status post Kcentra -CBI clamped today -Appreciate urology -Foley catheter to be kept in for 1 week and follow-up outpatient for voiding trial, will need cystoscopy as outpatient  UTI, present on admission -Urine culture showing Enterococcus, sensitivity pending -Rocephin --> Amoxicillin per pharmacy. I asked patient regarding her allergy listed for pcn "itching". She cannot recall when she had this reaction, denies anaphylactic reaction. She is agreeable to trying amoxicillin   Hypokalemia -Replace, trend  Hypertension -Continue  Norvasc  Hyperlipidemia -Continue Lipitor  Hypothyroidism -Continue Synthroid  Diabetes mellitus -Continue sliding scale insulin  GERD -Continue PPI   In agreement with assessment of the pressure ulcer as below:  Pressure Injury 06/21/21 Coccyx Mid Stage 2 -  Partial thickness loss of dermis presenting as a shallow open injury with a red, pink wound bed without slough. (Active)  06/21/21 2047  Location: Coccyx  Location Orientation: Mid  Staging: Stage 2 -  Partial thickness loss of dermis presenting as a shallow open injury with a red, pink wound bed without slough.  Wound Description (Comments):   Present on Admission: Yes         DVT prophylaxis:  SCDs Start: 06/21/21 0514  Code Status:     Code Status Orders  (From admission, onward)           Start     Ordered   06/21/21 0515  Do not attempt resuscitation (DNR)  Continuous       Question Answer Comment  In the event of cardiac or respiratory ARREST Do not call a "code blue"   In the event of cardiac or respiratory ARREST Do not perform Intubation, CPR, defibrillation or ACLS   In the event of cardiac or respiratory ARREST Use medication by any route, position, wound care, and other measures to relive pain and suffering. May use oxygen, suction and manual treatment of airway obstruction as needed for comfort.      06/21/21 0514           Code Status History     Date Active Date Inactive Code Status Order ID Comments User Context   06/21/2021 0141 06/21/2021 0515 DNR [id number]  Joanne Gavel, PA-C ED  Family Communication: None at bedside Disposition Plan:  Status is: Inpatient  Remains inpatient appropriate because:IV treatments appropriate due to intensity of illness or inability to take PO and Inpatient level of care appropriate due to severity of illness  Dispo: The patient is from: SNF              Anticipated d/c is to: SNF              Patient currently is not medically stable to  d/c.   Difficult to place patient No      Consultants:  Urology  Procedures:  None   Antimicrobials:  Anti-infectives (From admission, onward)    Start     Dose/Rate Route Frequency Ordered Stop   06/21/21 0130  cefTRIAXone (ROCEPHIN) 1 g in sodium chloride 0.9 % 100 mL IVPB        1 g 200 mL/hr over 30 Minutes Intravenous Every 24 hours 06/21/21 0118          Objective: Vitals:   06/22/21 0226 06/22/21 0602 06/22/21 0828 06/22/21 0832  BP: 127/62 (!) 104/52 (!) 114/50 (!) 114/50  Pulse: 76 79  79  Resp: 16 18    Temp: (!) 97.4 F (36.3 C) (!) 97.5 F (36.4 C)    TempSrc: Oral Oral    SpO2: 99% 100%    Weight:      Height:        Intake/Output Summary (Last 24 hours) at 06/22/2021 1157 Last data filed at 06/22/2021 0740 Gross per 24 hour  Intake 10927.98 ml  Output 10350 ml  Net 577.98 ml   Filed Weights   06/20/21 2155  Weight: 78 kg    Examination: General exam: Appears calm and comfortable  Respiratory system: Clear to auscultation. Respiratory effort normal. No respiratory distress. No conversational dyspnea.  Cardiovascular system: S1 & S2 heard, RRR. No murmurs. No pedal edema. Gastrointestinal system: Abdomen is nondistended, soft and nontender. Normal bowel sounds heard. Central nervous system: Alert and oriented. No focal neurological deficits. Speech clear.  Extremities: Symmetric in appearance  Skin: No rashes, lesions or ulcers on exposed skin  Psychiatry: Judgement and insight appear normal. Mood & affect appropriate.   Data Reviewed: I have personally reviewed following labs and imaging studies  CBC: Recent Labs  Lab 06/20/21 2208 06/21/21 0552 06/21/21 0939 06/22/21 0553  WBC 13.3* 14.7* 15.2* 19.5*  HGB 10.4* 8.4* 8.3* 8.5*  HCT 32.8* 26.8* 26.6* 26.8*  MCV 87.2 87.6 87.8 88.7  PLT 238 205 205 903   Basic Metabolic Panel: Recent Labs  Lab 06/20/21 2240 06/21/21 0552 06/22/21 0553  NA 135 135 136  K 3.2* 2.9* 3.2*  CL  101 102 106  CO2 21* 21* 19*  GLUCOSE 151* 133* 100*  BUN 19 18 16   CREATININE 1.11* 1.07* 0.90  CALCIUM 8.6* 8.4* 8.0*   GFR: Estimated Creatinine Clearance: 46.6 mL/min (by C-G formula based on SCr of 0.9 mg/dL). Liver Function Tests: Recent Labs  Lab 06/20/21 2240  AST 18  ALT 22  ALKPHOS 92  BILITOT 0.4  PROT 6.2*  ALBUMIN 2.2*   No results for input(s): LIPASE, AMYLASE in the last 168 hours. No results for input(s): AMMONIA in the last 168 hours. Coagulation Profile: Recent Labs  Lab 06/20/21 2208  INR 1.7*   Cardiac Enzymes: No results for input(s): CKTOTAL, CKMB, CKMBINDEX, TROPONINI in the last 168 hours. BNP (last 3 results) No results for input(s): PROBNP in the last 8760 hours. HbA1C: No  results for input(s): HGBA1C in the last 72 hours. CBG: Recent Labs  Lab 06/21/21 0754 06/21/21 1128 06/21/21 2130 06/22/21 0740 06/22/21 1155  GLUCAP 114* 123* 118* 99 87   Lipid Profile: No results for input(s): CHOL, HDL, LDLCALC, TRIG, CHOLHDL, LDLDIRECT in the last 72 hours. Thyroid Function Tests: No results for input(s): TSH, T4TOTAL, FREET4, T3FREE, THYROIDAB in the last 72 hours. Anemia Panel: No results for input(s): VITAMINB12, FOLATE, FERRITIN, TIBC, IRON, RETICCTPCT in the last 72 hours. Sepsis Labs: Recent Labs  Lab 06/20/21 2240 06/21/21 0040  LATICACIDVEN 2.3* 2.3*    Recent Results (from the past 240 hour(s))  Blood culture (routine x 2)     Status: None (Preliminary result)   Collection Time: 06/20/21 10:40 PM   Specimen: BLOOD  Result Value Ref Range Status   Specimen Description   Final    BLOOD RIGHT ANTECUBITAL Performed at [facility] [address], [place], [place] [address]    Special Requests   Final    BOTTLES DRAWN AEROBIC AND ANAEROBIC Blood Culture adequate volume Performed at [facility] [address] [place], [place] [address]    Culture   Final    NO GROWTH 1 DAY Performed  at [facility], [place] [address], [place],  [address]    Report Status PENDING  Incomplete  Resp Panel by RT-PCR (Flu A&B, Covid) Nasopharyngeal Swab     Status: None   Collection Time: 06/21/21 12:56 AM   Specimen: Nasopharyngeal Swab; Nasopharyngeal(NP) swabs in vial transport medium  Result Value Ref Range Status   SARS Coronavirus 2 by RT PCR NEGATIVE NEGATIVE Final    Comment: (NOTE) SARS-CoV-2 target nucleic acids are NOT DETECTED.  The SARS-CoV-2 RNA is generally detectable in upper respiratory specimens during the acute phase of infection. The lowest concentration of SARS-CoV-2 viral copies this assay can detect is 138 copies/mL. A negative result does not preclude SARS-Cov-2 infection and should not be used as the sole basis for treatment or other patient management decisions. A negative result may occur with  improper specimen collection/handling, submission of specimen other than nasopharyngeal swab, presence of viral mutation(s) within the areas targeted by this assay, and inadequate number of viral copies(<138 copies/mL). A negative result must be combined with clinical observations, patient history, and epidemiological information. The expected result is Negative.  Fact Sheet for Patients:  EntrepreneurPulse.com.au  Fact Sheet for Healthcare Providers:  IncredibleEmployment.be  This test is no t yet approved or cleared by the [place] FDA and  has been authorized for detection and/or diagnosis of SARS-CoV-2 by FDA under an Emergency Use Authorization (EUA). This EUA will remain  in effect (meaning this test can be used) for the duration of the COVID-19 declaration under Section 564(b)(1) of the Act, 21 U.S.C.section 360bbb-3(b)(1), unless the authorization is terminated  or revoked sooner.       Influenza A by PCR NEGATIVE NEGATIVE Final   Influenza B by PCR NEGATIVE NEGATIVE Final    Comment: (NOTE) The  Xpert Xpress SARS-CoV-2/FLU/RSV plus assay is intended as an aid in the diagnosis of influenza from Nasopharyngeal swab specimens and should not be used as a sole basis for treatment. Nasal washings and aspirates are unacceptable for Xpert Xpress SARS-CoV-2/FLU/RSV testing.  Fact Sheet for Patients: EntrepreneurPulse.com.au  Fact Sheet for Healthcare Providers: IncredibleEmployment.be  This test is not yet approved or cleared by the [place] FDA and has been authorized for detection and/or diagnosis of SARS-CoV-2  by FDA under an Emergency Use Authorization (EUA). This EUA will remain in effect (meaning this test can be used) for the duration of the COVID-19 declaration under Section 564(b)(1) of the Act, 21 U.S.C. section 360bbb-3(b)(1), unless the authorization is terminated or revoked.  Performed at [facility], [place] [address] [place], [place] [address]   Urine Culture     Status: Abnormal (Preliminary result)   Collection Time: 06/21/21  1:13 AM   Specimen: Urine, Catheterized  Result Value Ref Range Status   Specimen Description   Final    URINE, CATHETERIZED Performed at [facility] [address], [place], [place] [address]    Special Requests   Final    NONE Performed at [facility], [place] [address] [place], [place] [address]    Culture >=100,000 COLONIES/mL ENTEROCOCCUS FAECALIS (A)  Final   Report Status PENDING  Incomplete  Blood culture (routine x 2)     Status: None (Preliminary result)   Collection Time: 06/21/21  1:28 AM   Specimen: BLOOD  Result Value Ref Range Status   Specimen Description   Final    BLOOD BLOOD RIGHT FOREARM Performed at [facility] [address] [place], [place] [address]    Special Requests   Final    BOTTLES DRAWN AEROBIC AND ANAEROBIC Blood Culture adequate volume Performed at [facility] [address], [place], [place] [address]    Culture   Final    NO GROWTH 1 DAY Performed at [facility], [place] [address] [place], [place] [address]    Report Status PENDING  Incomplete      Radiology Studies: CT ABDOMEN PELVIS WO CONTRAST  Result Date: 06/21/2021 CLINICAL DATA:  Vaginal bleeding, possibly UTI, passage of vaginal clot EXAM: CT ABDOMEN AND PELVIS WITHOUT CONTRAST TECHNIQUE: Multidetector CT imaging of the abdomen and pelvis was performed following the standard protocol without IV contrast. COMPARISON:  03/23/2021 FINDINGS: Lower chest: Bibasilar atelectasis and/or scarring. Lung bases are clear. Normal heart size. No pericardial effusion. Coronary artery atherosclerosis. Abundant mediastinal and epicardial fat. Atherosclerotic calcification of the thoracic aorta. Hepatobiliary: Pneumobilia and surgical changes noted in the region of the gallbladder fossa. No concerning focal parenchymal lesion. Normal hepatic attenuation. Smooth liver surface contour. No visible calcified gallstones. Pancreas: Pancreatic atrophy is similar to prior. No pancreatic ductal dilatation or surrounding inflammatory changes. Spleen: Normal in size. No concerning splenic lesions. Adrenals/Urinary Tract: Normal adrenal glands. Mild bilateral renal atrophy. Faint bilateral perinephric stranding and mild hydroureteronephrosis, left greater than right, with bilateral extrarenal pelves. No obstructive urolithiasis. Circumferential thickening of the urinary bladder. Heterogeneous material noted within the bladder lumen, suspect hemorrhagic products. Stomach/Bowel: Distal esophagus, stomach and duodenal sweep are unremarkable. No small bowel wall thickening or dilatation. No evidence of obstruction. Appendix is not visualized. No focal inflammation the vicinity of the cecum to suggest an occult appendicitis. No colonic dilatation or wall thickening. Scattered sigmoid colonic diverticula without focal  inflammation to suggest diverticulitis. Vascular/Lymphatic: Atherosclerotic calcifications within the abdominal aorta and branch vessels. No aneurysm or ectasia. No enlarged abdominopelvic lymph nodes. Reproductive: Hysterectomy. No gross abnormality of the hysterectomy cuff. No worrisome adnexal lesion. Other: Fat containing ventral hernia with small surgical clip. Additional small umbilical and infraumbilical fat containing hernias as well. Postsurgical changes the anterior abdominal wall. Abdominal wall laxity and rectus diastasis. No free abdominopelvic air or fluid. Musculoskeletal: Multilevel degenerative changes are present in the imaged portions of  the spine. No acute osseous abnormality or suspicious osseous lesion. IMPRESSION: Circumferential thickening of the urinary bladder containing intermediate attenuation material, likely hemorrhagic products which would explain the patient's transvaginal passage of blood clots. Some mild hydroureteronephrosis is noted as well with faint perinephric stranding. Can reflect hemorrhagic cystitis with ascending tract infection. Correlate with urinalysis. Prior hysterectomy.  No gross abnormality of the vaginal cuff. Colonic diverticulosis without evidence of acute diverticulitis. Prior cholecystectomy and likely biliary manipulation with chronic pneumobilia. Aortic Atherosclerosis (ICD10-I70.0). Coronary artery atherosclerosis. Stable ventral fat containing hernias. Electronically Signed   By: Lovena Le M.D.   On: 06/21/2021 00:37   CT HEAD WO CONTRAST (5MM)  Result Date: 06/20/2021 CLINICAL DATA:  Altered mental status EXAM: CT HEAD WITHOUT CONTRAST TECHNIQUE: Contiguous axial images were obtained from the base of the skull through the vertex without intravenous contrast. COMPARISON:  03/23/2021 FINDINGS: Brain: No evidence of acute infarction, hemorrhage, hydrocephalus, extra-axial collection or mass lesion/mass effect. Subcortical white matter and  periventricular small vessel ischemic changes. Vascular: No hyperdense vessel or unexpected calcification. Skull: Normal. Negative for fracture or focal lesion. Sinuses/Orbits: The visualized paranasal sinuses are essentially clear. The mastoid air cells are unopacified. Other: None. IMPRESSION: No evidence of acute intracranial abnormality. Small vessel ischemic changes. Electronically Signed   By: Julian Hy M.D.   On: 06/20/2021 23:28   DG Chest Portable 1 View  Result Date: 06/20/2021 CLINICAL DATA:  Shortness of breath EXAM: PORTABLE CHEST 1 VIEW COMPARISON:  03/23/2021 FINDINGS: Cardiomegaly. Aortic atherosclerosis. Left basilar atelectasis or infiltrate. No visible effusions or acute bony abnormality. IMPRESSION: Left base atelectasis or infiltrate. Electronically Signed   By: Rolm Baptise M.D.   On: 06/20/2021 22:52      Scheduled Meds:  amLODipine  2.5 mg Oral Daily   atorvastatin  10 mg Oral Daily   Chlorhexidine Gluconate Cloth  6 each Topical Daily   insulin aspart  0-9 Units Subcutaneous TID WC   levothyroxine  75 mcg Oral Q0600   pantoprazole  40 mg Oral Daily   potassium chloride  40 mEq Oral Q4H   Continuous Infusions:  cefTRIAXone (ROCEPHIN)  IV 1 g (06/22/21 0117)   sodium chloride irrigation       LOS: 1 day      Time spent: 25 minutes   Dessa Phi, DO [facility] 06/22/2021, 11:57 AM   Available via Epic secure chat 7am-7pm After these hours, please refer to coverage provider listed on [email or web address]

## 2021-06-23 LAB — CBC
HCT: 22.2 % — ABNORMAL LOW (ref 36.0–46.0)
Hemoglobin: 6.9 g/dL — CL (ref 12.0–15.0)
MCH: 28 pg (ref 26.0–34.0)
MCHC: 31.1 g/dL (ref 30.0–36.0)
MCV: 90.2 fL (ref 80.0–100.0)
Platelets: 191 10*3/uL (ref 150–400)
RBC: 2.46 MIL/uL — ABNORMAL LOW (ref 3.87–5.11)
RDW: 19.1 % — ABNORMAL HIGH (ref 11.5–15.5)
WBC: 11.4 10*3/uL — ABNORMAL HIGH (ref 4.0–10.5)
nRBC: 0.2 % (ref 0.0–0.2)

## 2021-06-23 LAB — BASIC METABOLIC PANEL WITH GFR
Anion gap: 6 (ref 5–15)
BUN: 13 mg/dL (ref 8–23)
CO2: 22 mmol/L (ref 22–32)
Calcium: 8.2 mg/dL — ABNORMAL LOW (ref 8.9–10.3)
Chloride: 111 mmol/L (ref 98–111)
Creatinine, Ser: 0.94 mg/dL (ref 0.44–1.00)
GFR, Estimated: >60 mL/min
Glucose, Bld: 105 mg/dL — ABNORMAL HIGH (ref 70–99)
Potassium: 4.1 mmol/L (ref 3.5–5.1)
Sodium: 139 mmol/L (ref 135–145)

## 2021-06-23 LAB — URINE CULTURE: Culture: >=100000 — AB

## 2021-06-23 LAB — MAGNESIUM: Magnesium: 1.7 mg/dL (ref 1.7–2.4)

## 2021-06-23 LAB — HEMOGLOBIN AND HEMATOCRIT, BLOOD
HCT: 21.3 % — ABNORMAL LOW (ref 36.0–46.0)
HCT: 28.7 % — ABNORMAL LOW (ref 36.0–46.0)
Hemoglobin: 6.7 g/dL — CL (ref 12.0–15.0)
Hemoglobin: 9 g/dL — ABNORMAL LOW (ref 12.0–15.0)

## 2021-06-23 LAB — GLUCOSE, CAPILLARY
Glucose-Capillary: 131 mg/dL — ABNORMAL HIGH (ref 70–99)
Glucose-Capillary: 93 mg/dL (ref 70–99)
Glucose-Capillary: 248 mg/dL — ABNORMAL HIGH (ref 70–99)

## 2021-06-23 LAB — PREPARE RBC (CROSSMATCH)

## 2021-06-23 MED ORDER — SODIUM CHLORIDE 0.9% IV SOLUTION: Freq: Once | INTRAVENOUS | Status: DC

## 2021-06-23 NOTE — Progress Notes (Signed)
Verbal consent via phone obtained for blood transfusion. From Carrolyn Leigh son

## 2021-06-23 NOTE — Evaluation (Signed)
Occupational Therapy Evaluation Patient Details Name: Cindy Gordon MRN: [id number] DOB: 11/05/1941 Today's Date: 06/23/2021    History of Present Illness Patient is an 80 year old female with recent COVID infection, presents to ED with hematuria. CT abdomen pelvis shows clot in the urinary bladder and also interstitial cystitis findings and ascending infection. Patient treated antibiotics, given Kcentra, started on continuous bladder irrigation. PMH includes B shoulder replacements with staph infection, hypertension, diabetes mellitus, hypothyroidism   Clinical Impression   Limited to bed level evaluation as patient ~50% through blood transfusion with BP in semi supine reading 108/44 and patient already feeling fatigued/lethargic. Patient's sons arrive at end of session, able to obtain PLOF info. Patient has not been able to stand with walker for ~68yr, has to have assist from staff or son to transfer into wheelchair. Son states "when they help" patient needing assist to complete ADL tasks such as bathing/dressing. Son often having to feed patient as she has limited shoulder ROM and strength from replacements. Patient needed total A x2 to roll in bed for repositioning and improve posture in preparation for bed level ADLs. Patient able to wash ~50% of her face and take 1 bite of ice cream with set up assist in bed taking maximal effort/increased time. Patient reports feeling exhausted after feeding herself 1 bite of food and declined to have OT assist for any further bites. Unfortunately patient appears at baseline needing 24/7 supervision + assistance to perform ADL tasks. Patient's son reports hopeful to get patient into LTC bed at [facility]. Patient does not demonstrate skilled OT potential at this time, will sign off.     Follow Up Recommendations  Other (comment) (patient's son's hopeful for D/C to different SNF)    Equipment Recommendations  None recommended by OT       Precautions /  Restrictions Precautions Precautions: Fall Restrictions Weight Bearing Restrictions: No      Mobility Bed Mobility Overal bed mobility: Needs Assistance Bed Mobility: Rolling Rolling: Total assist;+2 for physical assistance         General bed mobility comments: significant weakness needing total A x2 to roll onto R side for respositioning in bed    Transfers                 General transfer comment: deferred, patient receiving first blood transfusion and BP reading 108/44 at bed level        ADL either performed or assessed with clinical judgement   ADL Overall ADL's : At baseline                                       General ADL Comments: per son patient has been needing help to feed herself, bath, transfer to wheelchair. After repositioned in bed for better posture took patient maximial effort to bring spoon to mouth to take 1 bite of ice cream.      Pertinent Vitals/Pain Pain Assessment: No/denies pain     Hand Dominance Right   Extremity/Trunk Assessment Upper Extremity Assessment Upper Extremity Assessment: Generalized weakness;RUE deficits/detail;LUE deficits/detail RUE Deficits / Details: son reports patient with limited B shoulder AROM due to history of replacements. Patient with weak bilateral grip/forearm strength taking maximial effort to hold onto ice cream to feed herself   Lower Extremity Assessment Lower Extremity Assessment: Defer to PT evaluation       Communication Communication Communication: Expressive difficulties (low volume)  Cognition Arousal/Alertness: Lethargic Behavior During Therapy: Flat affect Overall Cognitive Status: History of cognitive impairments - at baseline                                 General Comments: patient knows shes in hospital but states [place], not oriented to time. will follow cues with increased time, reports feeling very tired              Home Living  Family/patient expects to be discharged to:: Skilled nursing facility                                        Prior Functioning/Environment          Comments: spoke with patient's son, reports patient has not ambulated for ~year due to "no cartilage" in knee and hx of dog attack to L LE. reprots "when they helped" at facility patient needed assistance for functional transfers to wheelchair, bathing, toileting and feeding. Son would frequently have to feed patient himself and would advocate to get patient bathed. Son is hoping to get patient into different nursing facility.        OT Problem List: Obesity;Decreased strength;Decreased activity tolerance      OT Treatment/Interventions:      OT Goals(Current goals can be found in the care plan section) Acute Rehab OT Goals Patient Stated Goal: go to [facility] facility OT Goal Formulation: With patient/family  OT Frequency:      AM-PAC OT "6 Clicks" Daily Activity     Outcome Measure Help from another person eating meals?: Total Help from another person taking care of personal grooming?: Total Help from another person toileting, which includes using toliet, bedpan, or urinal?: Total Help from another person bathing (including washing, rinsing, drying)?: Total Help from another person to put on and taking off regular upper body clothing?: Total Help from another person to put on and taking off regular lower body clothing?: Total 6 Click Score: 6   End of Session Nurse Communication: Mobility status  Activity Tolerance: Patient limited by fatigue;Patient limited by lethargy Patient left: in bed;with call bell/phone within reach;with family/visitor present;with bed alarm set  OT Visit Diagnosis: Muscle weakness (generalized) (M62.81)                Time: 9169-4503 OT Time Calculation (min): 23 min Charges:  OT General Charges $OT Visit: 1 Visit OT Evaluation $OT Eval Low Complexity: 1 Low  Delbert Phenix OT OT  pager: [phone]  Rosemary Holms 06/23/2021, 2:08 PM

## 2021-06-23 NOTE — Progress Notes (Signed)
OT Cancellation Note  Patient Details Name: Cindy Gordon MRN: [id number] DOB: Apr 01, 1941   Cancelled Treatment:    Reason Eval/Treat Not Completed: Medical issues which prohibited therapy. Patient with critical hemoglobin, waiting to receive blood transfusion. Will check back as schedule permits.  Delbert Phenix OT OT pager: [place] 06/23/2021, 10:03 AM

## 2021-06-23 NOTE — Progress Notes (Signed)
PROGRESS NOTE    Cindy Gordon  TKZ:[id number] DOB: August 14, 1941 DOA: 06/20/2021 PCP: Rochel Brome, MD     Brief Narrative:  Cindy Gordon is a 80 y.o. female with history of hypertension, diabetes mellitus, hypothyroidism per report was recently diagnosed about 3 weeks ago with COVID infection at that time per the nursing home protocol was placed on apixaban. She was brought to the emergency department with concerns for hematuria. CT abdomen pelvis done shows features concerning for clot in the urinary bladder and also interstitial cystitis findings and ascending infection. Patient was treated antibiotics and patient also was given Kcentra, started on continuous bladder irrigation. Urology was consulted.    New events last 24 hours / Subjective: Patient without any new complaints today.  CBI remains clamped.  She states that she has been at a nursing facility for about less than a year, is largely wheelchair-bound.  Assessment & Plan:   Principal Problem:   Hematuria Active Problems:   Essential hypertension, benign   Controlled type 2 diabetes mellitus with diabetic polyneuropathy, without long-term current use of insulin (HCC)   ABLA (acute blood loss anemia)   Pressure injury of skin   Hematuria -Holding Eliquis, doubt she needs to resume this.  Eliquis was started as outpatient for VTE prophylaxis in setting of COVID infection several weeks ago -Status post Kcentra -CBI clamped  -Appreciate urology -Foley catheter to be kept in for 1 week and follow-up outpatient for voiding trial, will need cystoscopy as outpatient  UTI, present on admission -Urine culture showing Enterococcus, VRE -Rocephin --> Amoxicillin per pharmacy. I asked patient regarding her allergy listed for pcn "itching". She cannot recall when she had this reaction, denies anaphylactic reaction. She is agreeable to trying amoxicillin   Acute blood loss anemia -Baseline hemoglobin around 13, admitted with  hemoglobin 10.4 and trended down to 6.7 today -Transfuse 1 unit packed red blood cell today and monitor CBC and monitor for other source of blood loss  Hypertension -Continue Norvasc  Hyperlipidemia -Continue Lipitor  Hypothyroidism -Continue Synthroid  Diabetes mellitus -Continue sliding scale insulin  GERD -Continue PPI   In agreement with assessment of the pressure ulcer as below:  Pressure Injury 06/21/21 Coccyx Mid Stage 2 -  Partial thickness loss of dermis presenting as a shallow open injury with a red, pink wound bed without slough. (Active)  06/21/21 2047  Location: Coccyx  Location Orientation: Mid  Staging: Stage 2 -  Partial thickness loss of dermis presenting as a shallow open injury with a red, pink wound bed without slough.  Wound Description (Comments):   Present on Admission: Yes         DVT prophylaxis:  SCDs Start: 06/21/21 0514  Code Status:     Code Status Orders  (From admission, onward)           Start     Ordered   06/21/21 0515  Do not attempt resuscitation (DNR)  Continuous       Question Answer Comment  In the event of cardiac or respiratory ARREST Do not call a "code blue"   In the event of cardiac or respiratory ARREST Do not perform Intubation, CPR, defibrillation or ACLS   In the event of cardiac or respiratory ARREST Use medication by any route, position, wound care, and other measures to relive pain and suffering. May use oxygen, suction and manual treatment of airway obstruction as needed for comfort.      06/21/21 5732  Code Status History     Date Active Date Inactive Code Status Order ID Comments User Context   06/21/2021 0141 06/21/2021 0515 DNR [id number]  Joanne Gavel, PA-C ED      Family Communication: None at bedside Disposition Plan:  Status is: Inpatient  Remains inpatient appropriate because:IV treatments appropriate due to intensity of illness or inability to take PO and Inpatient level of  care appropriate due to severity of illness  Dispo: The patient is from: SNF              Anticipated d/c is to: SNF              Patient currently is not medically stable to d/c.   Difficult to place patient No      Consultants:  Urology  Procedures:  None   Antimicrobials:  Anti-infectives (From admission, onward)    Start     Dose/Rate Route Frequency Ordered Stop   06/22/21 1600  amoxicillin (AMOXIL) capsule 500 mg        500 mg Oral Every 8 hours 06/22/21 1359     06/21/21 0130  cefTRIAXone (ROCEPHIN) 1 g in sodium chloride 0.9 % 100 mL IVPB  Status:  Discontinued        1 g 200 mL/hr over 30 Minutes Intravenous Every 24 hours 06/21/21 0118 06/22/21 1359        Objective: Vitals:   06/22/21 1158 06/22/21 2038 06/23/21 0504 06/23/21 0835  BP: (!) 115/53 (!) 132/49 (!) 123/58 (!) 116/55  Pulse: 82 85 90   Resp: 12 16 17    Temp: 98.7 F (37.1 C) (!) 97.4 F (36.3 C) 98.6 F (37 C)   TempSrc:  Oral Oral   SpO2:  100% 98%   Weight:      Height:        Intake/Output Summary (Last 24 hours) at 06/23/2021 1030 Last data filed at 06/23/2021 0500 Gross per 24 hour  Intake 310 ml  Output 450 ml  Net -140 ml    Filed Weights   06/20/21 2155  Weight: 78 kg    Examination: General exam: Appears calm and comfortable  Respiratory system: Clear to auscultation. Respiratory effort normal. Cardiovascular system: S1 & S2 heard, RRR. No pedal edema. Gastrointestinal system: Abdomen is nondistended, soft and nontender. Normal bowel sounds heard. Central nervous system: Alert and oriented. Non focal exam. Speech clear  Extremities: Symmetric in appearance bilaterally  Skin: No rashes, lesions or ulcers on exposed skin  Psychiatry: Judgement and insight appear stable. Mood & affect appropriate.    Data Reviewed: I have personally reviewed following labs and imaging studies  CBC: Recent Labs  Lab 06/20/21 2208 06/21/21 0552 06/21/21 0939 06/22/21 0553  06/23/21 0511 06/23/21 0804  WBC 13.3* 14.7* 15.2* 19.5* 11.4*  --   HGB 10.4* 8.4* 8.3* 8.5* 6.9* 6.7*  HCT 32.8* 26.8* 26.6* 26.8* 22.2* 21.3*  MCV 87.2 87.6 87.8 88.7 90.2  --   PLT 238 205 205 195 191  --     Basic Metabolic Panel: Recent Labs  Lab 06/20/21 2240 06/21/21 0552 06/22/21 0553 06/23/21 0511  NA 135 135 136 139  K 3.2* 2.9* 3.2* 4.1  CL 101 102 106 111  CO2 21* 21* 19* 22  GLUCOSE 151* 133* 100* 105*  BUN 19 18 16 13   CREATININE 1.11* 1.07* 0.90 0.94  CALCIUM 8.6* 8.4* 8.0* 8.2*  MG  --   --   --  1.7    GFR:  Estimated Creatinine Clearance: 44.7 mL/min (by C-G formula based on SCr of 0.94 mg/dL). Liver Function Tests: Recent Labs  Lab 06/20/21 2240  AST 18  ALT 22  ALKPHOS 92  BILITOT 0.4  PROT 6.2*  ALBUMIN 2.2*    No results for input(s): LIPASE, AMYLASE in the last 168 hours. No results for input(s): AMMONIA in the last 168 hours. Coagulation Profile: Recent Labs  Lab 06/20/21 2208  INR 1.7*    Cardiac Enzymes: No results for input(s): CKTOTAL, CKMB, CKMBINDEX, TROPONINI in the last 168 hours. BNP (last 3 results) No results for input(s): PROBNP in the last 8760 hours. HbA1C: No results for input(s): HGBA1C in the last 72 hours. CBG: Recent Labs  Lab 06/22/21 0740 06/22/21 1155 06/22/21 1703 06/22/21 2052 06/23/21 0740  GLUCAP 99 87 124* 96 131*    Lipid Profile: No results for input(s): CHOL, HDL, LDLCALC, TRIG, CHOLHDL, LDLDIRECT in the last 72 hours. Thyroid Function Tests: No results for input(s): TSH, T4TOTAL, FREET4, T3FREE, THYROIDAB in the last 72 hours. Anemia Panel: No results for input(s): VITAMINB12, FOLATE, FERRITIN, TIBC, IRON, RETICCTPCT in the last 72 hours. Sepsis Labs: Recent Labs  Lab 06/20/21 2240 06/21/21 0040  LATICACIDVEN 2.3* 2.3*     Recent Results (from the past 240 hour(s))  Blood culture (routine x 2)     Status: None (Preliminary result)   Collection Time: 06/20/21 10:40 PM    Specimen: BLOOD  Result Value Ref Range Status   Specimen Description   Final    BLOOD RIGHT ANTECUBITAL Performed at [facility] [address], [place], [place] [address]    Special Requests   Final    BOTTLES DRAWN AEROBIC AND ANAEROBIC Blood Culture adequate volume Performed at [facility] [address], [place], [place] [address]    Culture   Final    NO GROWTH 2 DAYS Performed at [facility] [address], [place], [place] [address]    Report Status PENDING  Incomplete  Resp Panel by RT-PCR (Flu A&B, Covid) Nasopharyngeal Swab     Status: None   Collection Time: 06/21/21 12:56 AM   Specimen: Nasopharyngeal Swab; Nasopharyngeal(NP) swabs in vial transport medium  Result Value Ref Range Status   SARS Coronavirus 2 by RT PCR NEGATIVE NEGATIVE Final    Comment: (NOTE) SARS-CoV-2 target nucleic acids are NOT DETECTED.  The SARS-CoV-2 RNA is generally detectable in upper respiratory specimens during the acute phase of infection. The lowest concentration of SARS-CoV-2 viral copies this assay can detect is 138 copies/mL. A negative result does not preclude SARS-Cov-2 infection and should not be used as the sole basis for treatment or other patient management decisions. A negative result may occur with  improper specimen collection/handling, submission of specimen other than nasopharyngeal swab, presence of viral mutation(s) within the areas targeted by this assay, and inadequate number of viral copies(<138 copies/mL). A negative result must be combined with clinical observations, patient history, and epidemiological information. The expected result is Negative.  Fact Sheet for Patients:  EntrepreneurPulse.com.au  Fact Sheet for Healthcare Providers:  IncredibleEmployment.be  This test is no t yet approved or cleared by the [place] FDA and  has been authorized for detection and/or  diagnosis of SARS-CoV-2 by FDA under an Emergency Use Authorization (EUA). This EUA will remain  in effect (meaning this test can be used) for the duration of the COVID-19 declaration under Section 564(b)(1) of the Act, 21 U.S.C.section 360bbb-3(b)(1), unless the authorization  is terminated  or revoked sooner.       Influenza A by PCR NEGATIVE NEGATIVE Final   Influenza B by PCR NEGATIVE NEGATIVE Final    Comment: (NOTE) The Xpert Xpress SARS-CoV-2/FLU/RSV plus assay is intended as an aid in the diagnosis of influenza from Nasopharyngeal swab specimens and should not be used as a sole basis for treatment. Nasal washings and aspirates are unacceptable for Xpert Xpress SARS-CoV-2/FLU/RSV testing.  Fact Sheet for Patients: EntrepreneurPulse.com.au  Fact Sheet for Healthcare Providers: IncredibleEmployment.be  This test is not yet approved or cleared by the [place] FDA and has been authorized for detection and/or diagnosis of SARS-CoV-2 by FDA under an Emergency Use Authorization (EUA). This EUA will remain in effect (meaning this test can be used) for the duration of the COVID-19 declaration under Section 564(b)(1) of the Act, 21 U.S.C. section 360bbb-3(b)(1), unless the authorization is terminated or revoked.  Performed at [facility], [place] [address], [place], [place] [address]   Urine Culture     Status: Abnormal   Collection Time: 06/21/21  1:13 AM   Specimen: Urine, Catheterized  Result Value Ref Range Status   Specimen Description   Final    URINE, CATHETERIZED Performed at [facility] [address] [place], [place] [address]    Special Requests   Final    NONE Performed at [facility], [place] [address] [place], [place] [address]    Culture (A)  Final    >=100,000 COLONIES/mL VANCOMYCIN RESISTANT ENTEROCOCCUS ISOLATED   Report Status 06/23/2021 FINAL  Final    Organism ID, Bacteria VANCOMYCIN RESISTANT ENTEROCOCCUS ISOLATED (A)  Final      Susceptibility   Vancomycin resistant enterococcus isolated - MIC*    AMPICILLIN <=2 SENSITIVE Sensitive     NITROFURANTOIN <=16 SENSITIVE Sensitive     VANCOMYCIN >=32 RESISTANT Resistant     LINEZOLID 2 SENSITIVE Sensitive     * >=100,000 COLONIES/mL VANCOMYCIN RESISTANT ENTEROCOCCUS ISOLATED  Blood culture (routine x 2)     Status: None (Preliminary result)   Collection Time: 06/21/21  1:28 AM   Specimen: BLOOD  Result Value Ref Range Status   Specimen Description   Final    BLOOD BLOOD RIGHT FOREARM Performed at [facility] [address], [place], [place] [address]    Special Requests   Final    BOTTLES DRAWN AEROBIC AND ANAEROBIC Blood Culture adequate volume Performed at [facility] [address] [place], [place] [address]    Culture   Final    NO GROWTH 2 DAYS Performed at [facility] [address], [place], [place] [address]    Report Status PENDING  Incomplete       Radiology Studies: No results found.    Scheduled Meds:  sodium chloride   Intravenous Once   amLODipine  2.5 mg Oral Daily   amoxicillin  500 mg Oral Q8H   atorvastatin  10 mg Oral Daily   Chlorhexidine Gluconate Cloth  6 each Topical Daily   insulin aspart  0-9 Units Subcutaneous TID WC   levothyroxine  75 mcg Oral Q0600   pantoprazole  40 mg Oral Daily   Continuous Infusions:     LOS: 2 days      Time spent: 25 minutes   Dessa Phi, DO [facility] 06/23/2021, 10:30 AM   Available via Epic secure chat 7am-7pm After these hours, please refer to coverage provider listed on  [email or web address]

## 2021-06-23 NOTE — TOC Progression Note (Signed)
Transition of Care [facility]) - Progression Note    Patient Details  Name: Cindy Gordon MRN: [id number] Date of Birth: 1941/01/23  Transition of Care [facility]) CM/SW [place], [place] Phone Number: [phone] 06/23/2021, 10:11 AM  Clinical Narrative:     Increased SNF search.    Expected Discharge Plan: Skilled Nursing Facility Barriers to Discharge: No Barriers Identified  Expected Discharge Plan and Services Expected Discharge Plan: [place]                                               Social Determinants of Health (SDOH) Interventions    Readmission Risk Interventions No flowsheet data found.

## 2021-06-23 NOTE — Progress Notes (Addendum)
Urology Progress Note   80 y.o. female with past medical history of hypothyroidism, diabetes, fibromyalgia, GERD, hypertension who was recently hospitalized with COVID and discharged on Eliquis.  Presents with clot retention, gross hematuria.  Subjective: NAEON.  With CBI clamped her urine has remained clear yellow.  Her hemoglobin did downtrend to 6.9.  She does not appear to be actively bleeding from the urinary tract.  5 L of urine output.  Creatinine stable at 0.9.  Objective: Vital signs in last 24 hours: Temp:  [97.4 F (36.3 C)-98.7 F (37.1 C)] 98.6 F (37 C) (08/14 0504) Pulse Rate:  [82-90] 90 (08/14 0504) Resp:  [12-17] 17 (08/14 0504) BP: (115-132)/(49-58) 116/55 (08/14 0835) SpO2:  [98 %-100 %] 98 % (08/14 0504)  Intake/Output from previous day: 08/13 0701 - 08/14 0700 In: 3310 [P.O.:260] Out: 2950 [Urine:2950] Intake/Output this shift: No intake/output data recorded.  Physical Exam:  General: Alert and oriented CV: Regular rate Lungs: No increased work of breathing Abdomen: Soft, nontender nondistended GU: Foley in place draining clear yellow urine with CBI clamped. Ext: NT, No erythema  Lab Results: Recent Labs    06/22/21 0553 06/23/21 0511 06/23/21 0804  HGB 8.5* 6.9* 6.7*  HCT 26.8* 22.2* 21.3*    Recent Labs    06/22/21 0553 06/23/21 0511  NA 136 139  K 3.2* 4.1  CL 106 111  CO2 19* 22  GLUCOSE 100* 105*  BUN 16 13  CREATININE 0.90 0.94  CALCIUM 8.0* 8.2*     Studies/Results: No results found.  Assessment/Plan:  80 y.o. female with past medical history of hypothyroidism, diabetes, fibromyalgia, GERD, hypertension who was recently hospitalized with COVID and discharged on Eliquis.  Presents with clot retention, gross hematuria.  CBI has been clamped at the bedside this morning.  Her urine output is very light pink, without clots.  We will keep the CBI clamped.  I do recommend treating her empirically for UTI and following up per urine  culture data.  Especially in the setting of her uptrending white blood cell count however this could be related to significant lower urinary tract manipulation.  Recommend keeping the Foley catheter in place for 1 week in the setting of bladder stretch injury.  We will arrange for outpatient trial of void and gross hematuria work-up including cystoscopy.  -Continue Foley catheter to drainage -Continue UTI treatment for complicated UTI course.  Currently on amoxicillin. -Appreciate primary team's care for this patient -We will arrange for outpatient follow-up for voiding trial in about 1 week.  Has been requested.  She will need cystoscopy on an outpatient basis.  Dispo: Per primary team   LOS: 2 days

## 2021-06-23 NOTE — Progress Notes (Signed)
Critical Hemoglobin 6.9. PCP was notified. Awaiting new orders.

## 2021-06-24 DIAGNOSIS — L89152 Pressure ulcer of sacral region, stage 2: Secondary | ICD-10-CM | POA: Diagnosis not present

## 2021-06-24 LAB — BASIC METABOLIC PANEL
Anion gap: 4 — ABNORMAL LOW (ref 5–15)
BUN: 12 mg/dL (ref 8–23)
CO2: 21 mmol/L — ABNORMAL LOW (ref 22–32)
Calcium: 8.1 mg/dL — ABNORMAL LOW (ref 8.9–10.3)
Chloride: 112 mmol/L — ABNORMAL HIGH (ref 98–111)
Creatinine, Ser: 0.9 mg/dL (ref 0.44–1.00)
GFR, Estimated: >60 mL/min (ref >60)
Glucose, Bld: 89 mg/dL (ref 70–99)
Potassium: 3.6 mmol/L (ref 3.5–5.1)
Sodium: 137 mmol/L (ref 135–145)

## 2021-06-24 LAB — GLUCOSE, CAPILLARY
Glucose-Capillary: 96 mg/dL (ref 70–99)
Glucose-Capillary: 102 mg/dL — ABNORMAL HIGH (ref 70–99)
Glucose-Capillary: 128 mg/dL — ABNORMAL HIGH (ref 70–99)
Glucose-Capillary: 90 mg/dL (ref 70–99)
Glucose-Capillary: 90 mg/dL (ref 70–99)

## 2021-06-24 LAB — TYPE AND SCREEN
ABO/RH(D): O POS
Antibody Screen: NEGATIVE
Unit division: 0

## 2021-06-24 LAB — BPAM RBC
Blood Product Expiration Date: 202209142359
ISSUE DATE / TIME: 202208141109
Unit Type and Rh: 5100

## 2021-06-24 LAB — CBC
HCT: 28.4 % — ABNORMAL LOW (ref 36.0–46.0)
Hemoglobin: 8.8 g/dL — ABNORMAL LOW (ref 12.0–15.0)
MCH: 29 pg (ref 26.0–34.0)
MCHC: 31 g/dL (ref 30.0–36.0)
MCV: 93.7 fL (ref 80.0–100.0)
Platelets: 151 10*3/uL (ref 150–400)
RBC: 3.03 MIL/uL — ABNORMAL LOW (ref 3.87–5.11)
RDW: 18.6 % — ABNORMAL HIGH (ref 11.5–15.5)
WBC: 7.3 10*3/uL (ref 4.0–10.5)
nRBC: 0 % (ref 0.0–0.2)

## 2021-06-24 LAB — SARS CORONAVIRUS 2 (TAT 6-24 HRS): SARS Coronavirus 2: NEGATIVE

## 2021-06-24 MED ORDER — BOOST / RESOURCE BREEZE PO LIQD CUSTOM: 1.0000 | ORAL | Status: DC

## 2021-06-24 MED ORDER — ADULT MULTIVITAMIN W/MINERALS CH
1.0000 | ORAL_TABLET | Freq: Every day | ORAL | Status: DC
  Administered 2021-06-24 – 2021-06-25 (×2): 1 via ORAL
  Filled 2021-06-24 (×2): qty 1

## 2021-06-24 MED ORDER — ENSURE ENLIVE PO LIQD
237.0000 mL | ORAL | Status: DC
  Administered 2021-06-24: 237 mL via ORAL

## 2021-06-24 NOTE — TOC Progression Note (Signed)
Transition of Care [facility]) - Progression Note    Patient Details  Name: Cindy Gordon MRN: [id number] Date of Birth: 04-03-1941  Transition of [facility]) CM/SW Contact  Pailynn Vahey, Juliann Pulse, RN Phone Number: 06/24/2021, 4:45 PM  Clinical Narrative:  Pending [id number] Josem Kaufmann for [facility] from [facility].     Expected Discharge Plan: Skilled Nursing Facility Barriers to Discharge: Insurance Authorization  Expected Discharge Plan and Services Expected Discharge Plan: [place]                                               Social Determinants of Health (SDOH) Interventions    Readmission Risk Interventions No flowsheet data found.

## 2021-06-24 NOTE — Progress Notes (Addendum)
Initial Nutrition Assessment  DOCUMENTATION CODES:   Obesity unspecified  INTERVENTION:  - will order Boost Breeze once/day, each supplement provides 250 kcal and 9 grams of protein. - will order Ensure Plus once/day, each supplement provides 350 kcal and 13 grams of protein. -will order Magic Cup BID with meals, each supplement provides 290 kcal and 9 grams of protein. - will order 1 tablet multivitamin with minerals/day.    NUTRITION DIAGNOSIS:   Inadequate oral intake related to acute illness, lethargy/confusion as evidenced by meal completion < 50%.  GOAL:   Patient will meet greater than or equal to 90% of their needs  MONITOR:   PO intake, Supplement acceptance, Labs, Weight trends  REASON FOR ASSESSMENT:   Consult Assessment of nutrition requirement/status, Poor PO  ASSESSMENT:   80 y.o. female with medical history of HTN, DM, and hypothyroidism. She was diagnosed with COVID-19 ~3 weeks ago. She presented to the ED d/t concern for hematuria. CT abd/pelvis was concerning for a clot in the bladder and interstitial cystitis.  She is noted to be a/o to self only. She ate 25% of lunch today.   Patient has not been seen by a [place] RD at any time in the past.  Weight on 8/11 was 172 lb and weight has been fairly stable over the past 1 year. Mild pitting edema to BLE documented in the edema section of flow sheet.   Per notes: - hematuria - UTI - ABLA s/p 1 unit PRBCs on 8/14 - from SNF with likely plan for SNF at d/c     Labs reviewed; CBGs: 96, 102, 128 mg/dl, Cl: 112 mmol/l, Ca: 8.1 mg/dl.  Medications reviewed; sliding scale novolog, 75 mcg oral synthroid/day, 40 mg oral protonix/day.    Diet Order:   Diet Order             DIET DYS 2 Room service appropriate? Yes; Fluid consistency: Thin  Diet effective now                   EDUCATION NEEDS:   No education needs have been identified at this time  Skin:  Skin Assessment: Skin Integrity  Issues: Skin Integrity Issues:: Stage II Stage II: mid-coccyx  Last BM:  8/15 (type 4 x2)  Height:   Ht Readings from Last 1 Encounters:  06/20/21 5\' 2"  (1.575 m)    Weight:   Wt Readings from Last 1 Encounters:  06/20/21 78 kg      Estimated Nutritional Needs:  Kcal:  1575-1750 kcal Protein:  75-85 grams Fluid:  >/= 1.7 L/day     Jarome Matin, MS, RD, LDN, CNSC Inpatient Clinical Dietitian RD pager # available in [facility]  After hours/weekend pager # available in [facility]

## 2021-06-24 NOTE — Progress Notes (Signed)
  Speech Language Pathology Treatment: Dysphagia  Patient Details Name: Cindy Gordon MRN: [id number] DOB: 11/15/1940 Today's Date: 06/24/2021 Time: 1050-1105 SLP Time Calculation (min) (ACUTE ONLY): 15 min  Assessment / Plan / Recommendation Clinical Impression  Patient seen to address dysphagia goals and assess toleration of PO's. Of note, patient much more awake and alert and does report she is feeling better. Responses continues to be delayed but less so than initial evaluation. Patient pleasantly declined solids but did consume multiple, consecutive straw sips of thin liquids without overt s/s aspiration or penetration. Voice remained clear. Patient reported that she likes and is tolerating Dys 2 solids but difficult to determine accuracy of this response. SLP to continue to follow patient for diet toleration and potential for solid texture advancement.   HPI HPI: Patient is an 80 y.o. female with PMH:  hypertension, diabetes mellitus, hypothyroidism per report was recently diagnosed about 3 weeks ago with COVID infection at that time per the nursing home protocol was placed on apixaban. She presented to ER with increased vaginal bleeding. In ER, CT head was unremarkable, CT abdomen pelvis done shows features concerning for clot in the urinary bladder and also interstitial cystitis findings and ascending infection. COVID test negative. Patient admitted for further management of hematuria and possible developing UTI. BSE ordered due to patient observed with difficulty with liquids and solids.      SLP Plan  Continue with current plan of care       Recommendations  Diet recommendations: Dysphagia 2 (fine chop);Thin liquid Liquids provided via: Cup;Straw Medication Administration: Whole meds with puree Supervision: Patient able to self feed;Full supervision/cueing for compensatory strategies;Intermittent supervision to cue for compensatory strategies Compensations: Minimize environmental  distractions;Small sips/bites;Slow rate Postural Changes and/or Swallow Maneuvers: Seated upright 90 degrees                Oral Care Recommendations: Oral care BID;Staff/trained caregiver to provide oral care Follow up Recommendations: Skilled Nursing facility;24 hour supervision/assistance SLP Visit Diagnosis: Dysphagia, unspecified (R13.10) Plan: Continue with current plan of care       Sonia Baller, MA, CCC-SLP Speech Therapy

## 2021-06-24 NOTE — Progress Notes (Signed)
PROGRESS NOTE    Cindy Gordon  STM:[id number] DOB: 1941/03/01 DOA: 06/20/2021 PCP: Rochel Brome, MD     Brief Narrative:  Cindy Gordon is a 80 y.o. female with history of hypertension, diabetes mellitus, hypothyroidism per report was recently diagnosed about 3 weeks ago with COVID infection at that time per the nursing home protocol was placed on apixaban. She was brought to the emergency department with concerns for hematuria. CT abdomen pelvis done shows features concerning for clot in the urinary bladder and also interstitial cystitis findings and ascending infection. Patient was treated antibiotics and patient also was given Kcentra, started on continuous bladder irrigation. Urology was consulted.    New events last 24 hours / Subjective: Patient sitting in bed, about to eat breakfast.  She has no physical complaints on examination today.  Assessment & Plan:   Principal Problem:   Hematuria Active Problems:   Essential hypertension, benign   Controlled type 2 diabetes mellitus with diabetic polyneuropathy, without long-term current use of insulin (HCC)   ABLA (acute blood loss anemia)   Pressure injury of skin   Hematuria -Holding Eliquis, doubt she needs to resume this.  Eliquis was started as outpatient for VTE prophylaxis in setting of COVID infection several weeks ago -Status post Kcentra -CBI clamped  -Appreciate urology -Foley catheter to be kept in for 1 week and follow-up outpatient for voiding trial, will need cystoscopy as outpatient  UTI, present on admission -Urine culture showing Enterococcus, VRE -Rocephin --> Amoxicillin per pharmacy. I asked patient regarding her allergy listed for pcn "itching". She cannot recall when she had this reaction, denies anaphylactic reaction. She is agreeable to trying amoxicillin, no reaction noted  Acute blood loss anemia -Baseline hemoglobin around 13, admitted with hemoglobin 10.4 and trended down to 6.7 today -Status  post 1 unit packed red blood cell 8/14 -Hemoglobin stable today  Hypertension -Continue Norvasc  Hyperlipidemia -Continue Lipitor  Hypothyroidism -Continue Synthroid  Diabetes mellitus -Continue sliding scale insulin  GERD -Continue PPI   In agreement with assessment of the pressure ulcer as below:  Pressure Injury 06/21/21 Coccyx Mid Stage 2 -  Partial thickness loss of dermis presenting as a shallow open injury with a red, pink wound bed without slough. (Active)  06/21/21 2047  Location: Coccyx  Location Orientation: Mid  Staging: Stage 2 -  Partial thickness loss of dermis presenting as a shallow open injury with a red, pink wound bed without slough.  Wound Description (Comments):   Present on Admission: Yes         DVT prophylaxis:  SCDs Start: 06/21/21 0514  Code Status:     Code Status Orders  (From admission, onward)           Start     Ordered   06/21/21 0515  Do not attempt resuscitation (DNR)  Continuous       Question Answer Comment  In the event of cardiac or respiratory ARREST Do not call a "code blue"   In the event of cardiac or respiratory ARREST Do not perform Intubation, CPR, defibrillation or ACLS   In the event of cardiac or respiratory ARREST Use medication by any route, position, wound care, and other measures to relive pain and suffering. May use oxygen, suction and manual treatment of airway obstruction as needed for comfort.      06/21/21 0514           Code Status History     Date Active Date Inactive Code Status Order  ID Comments User Context   06/21/2021 0141 06/21/2021 0515 DNR [id number]  Joanne Gavel, PA-C ED      Family Communication: None at bedside Disposition Plan:  Status is: Inpatient  Remains inpatient appropriate because:Unsafe d/c plan  Dispo: The patient is from: SNF              Anticipated d/c is to: SNF              Patient currently is medically stable to d/c.  Awaiting SNF placement.   Difficult  to place patient No      Consultants:  Urology  Procedures:  None   Antimicrobials:  Anti-infectives (From admission, onward)    Start     Dose/Rate Route Frequency Ordered Stop   06/22/21 1600  amoxicillin (AMOXIL) capsule 500 mg        500 mg Oral Every 8 hours 06/22/21 1359     06/21/21 0130  cefTRIAXone (ROCEPHIN) 1 g in sodium chloride 0.9 % 100 mL IVPB  Status:  Discontinued        1 g 200 mL/hr over 30 Minutes Intravenous Every 24 hours 06/21/21 0118 06/22/21 1359        Objective: Vitals:   06/23/21 1445 06/23/21 2000 06/23/21 2049 06/24/21 0000  BP: (!) 111/48  (!) 92/43 (!) 102/46  Pulse: 77  78   Resp: 14  16   Temp: 98.2 F (36.8 C)  98.4 F (36.9 C)   TempSrc: Oral  Oral   SpO2: 96% 96%    Weight:      Height:        Intake/Output Summary (Last 24 hours) at 06/24/2021 1206 Last data filed at 06/24/2021 1116 Gross per 24 hour  Intake 795 ml  Output 2075 ml  Net -1280 ml    Filed Weights   06/20/21 2155  Weight: 78 kg    Examination: General exam: Appears calm and comfortable, frail appearing Respiratory system: Clear to auscultation. Respiratory effort normal. Cardiovascular system: S1 & S2 heard, RRR. No pedal edema. Gastrointestinal system: Abdomen is nondistended, soft and nontender. Normal bowel sounds heard. Central nervous system: Alert and oriented. Non focal exam Extremities: Symmetric in appearance bilaterally  Skin: No rashes, lesions or ulcers on exposed skin  Psychiatry: Stable   Data Reviewed: I have personally reviewed following labs and imaging studies  CBC: Recent Labs  Lab 06/21/21 0552 06/21/21 0939 06/22/21 0553 06/23/21 0511 06/23/21 0804 06/23/21 1850 06/24/21 0413  WBC 14.7* 15.2* 19.5* 11.4*  --   --  7.3  HGB 8.4* 8.3* 8.5* 6.9* 6.7* 9.0* 8.8*  HCT 26.8* 26.6* 26.8* 22.2* 21.3* 28.7* 28.4*  MCV 87.6 87.8 88.7 90.2  --   --  93.7  PLT 205 205 195 191  --   --  619    Basic Metabolic Panel: Recent  Labs  Lab 06/20/21 2240 06/21/21 0552 06/22/21 0553 06/23/21 0511 06/24/21 0413  NA 135 135 136 139 137  K 3.2* 2.9* 3.2* 4.1 3.6  CL 101 102 106 111 112*  CO2 21* 21* 19* 22 21*  GLUCOSE 151* 133* 100* 105* 89  BUN 19 18 16 13 12   CREATININE 1.11* 1.07* 0.90 0.94 0.90  CALCIUM 8.6* 8.4* 8.0* 8.2* 8.1*  MG  --   --   --  1.7  --     GFR: Estimated Creatinine Clearance: 46.6 mL/min (by C-G formula based on SCr of 0.9 mg/dL). Liver Function Tests: Recent Labs  Lab 06/20/21  2240  AST 18  ALT 22  ALKPHOS 92  BILITOT 0.4  PROT 6.2*  ALBUMIN 2.2*    No results for input(s): LIPASE, AMYLASE in the last 168 hours. No results for input(s): AMMONIA in the last 168 hours. Coagulation Profile: Recent Labs  Lab 06/20/21 2208  INR 1.7*    Cardiac Enzymes: No results for input(s): CKTOTAL, CKMB, CKMBINDEX, TROPONINI in the last 168 hours. BNP (last 3 results) No results for input(s): PROBNP in the last 8760 hours. HbA1C: No results for input(s): HGBA1C in the last 72 hours. CBG: Recent Labs  Lab 06/23/21 1129 06/23/21 1632 06/24/21 0555 06/24/21 0732 06/24/21 1113  GLUCAP 93 248* 96 102* 128*    Lipid Profile: No results for input(s): CHOL, HDL, LDLCALC, TRIG, CHOLHDL, LDLDIRECT in the last 72 hours. Thyroid Function Tests: No results for input(s): TSH, T4TOTAL, FREET4, T3FREE, THYROIDAB in the last 72 hours. Anemia Panel: No results for input(s): VITAMINB12, FOLATE, FERRITIN, TIBC, IRON, RETICCTPCT in the last 72 hours. Sepsis Labs: Recent Labs  Lab 06/20/21 2240 06/21/21 0040  LATICACIDVEN 2.3* 2.3*     Recent Results (from the past 240 hour(s))  Blood culture (routine x 2)     Status: None (Preliminary result)   Collection Time: 06/20/21 10:40 PM   Specimen: BLOOD  Result Value Ref Range Status   Specimen Description   Final    BLOOD RIGHT ANTECUBITAL Performed at [facility] [address] [place], [place] [address]     Special Requests   Final    BOTTLES DRAWN AEROBIC AND ANAEROBIC Blood Culture adequate volume Performed at [facility] [address] [place], [place] [address]    Culture   Final    NO GROWTH 3 DAYS Performed at [facility], [place] [address], [place], [place] [address]    Report Status PENDING  Incomplete  Resp Panel by RT-PCR (Flu A&B, Covid) Nasopharyngeal Swab     Status: None   Collection Time: 06/21/21 12:56 AM   Specimen: Nasopharyngeal Swab; Nasopharyngeal(NP) swabs in vial transport medium  Result Value Ref Range Status   SARS Coronavirus 2 by RT PCR NEGATIVE NEGATIVE Final    Comment: (NOTE) SARS-CoV-2 target nucleic acids are NOT DETECTED.  The SARS-CoV-2 RNA is generally detectable in upper respiratory specimens during the acute phase of infection. The lowest concentration of SARS-CoV-2 viral copies this assay can detect is 138 copies/mL. A negative result does not preclude SARS-Cov-2 infection and should not be used as the sole basis for treatment or other patient management decisions. A negative result may occur with  improper specimen collection/handling, submission of specimen other than nasopharyngeal swab, presence of viral mutation(s) within the areas targeted by this assay, and inadequate number of viral copies(<138 copies/mL). A negative result must be combined with clinical observations, patient history, and epidemiological information. The expected result is Negative.  Fact Sheet for Patients:  EntrepreneurPulse.com.au  Fact Sheet for Healthcare Providers:  IncredibleEmployment.be  This test is no t yet approved or cleared by the [place] FDA and  has been authorized for detection and/or diagnosis of SARS-CoV-2 by FDA under an Emergency Use Authorization (EUA). This EUA will remain  in effect (meaning this test can be used) for the duration of the COVID-19 declaration under Section  564(b)(1) of the Act, 21 U.S.C.section 360bbb-3(b)(1), unless the authorization is terminated  or revoked sooner.       Influenza A by PCR NEGATIVE NEGATIVE Final   Influenza  B by PCR NEGATIVE NEGATIVE Final    Comment: (NOTE) The Xpert Xpress SARS-CoV-2/FLU/RSV plus assay is intended as an aid in the diagnosis of influenza from Nasopharyngeal swab specimens and should not be used as a sole basis for treatment. Nasal washings and aspirates are unacceptable for Xpert Xpress SARS-CoV-2/FLU/RSV testing.  Fact Sheet for Patients: EntrepreneurPulse.com.au  Fact Sheet for Healthcare Providers: IncredibleEmployment.be  This test is not yet approved or cleared by the [place] FDA and has been authorized for detection and/or diagnosis of SARS-CoV-2 by FDA under an Emergency Use Authorization (EUA). This EUA will remain in effect (meaning this test can be used) for the duration of the COVID-19 declaration under Section 564(b)(1) of the Act, 21 U.S.C. section 360bbb-3(b)(1), unless the authorization is terminated or revoked.  Performed at [facility] [place] [address], [place], [place] [address]   Urine Culture     Status: Abnormal   Collection Time: 06/21/21  1:13 AM   Specimen: Urine, Catheterized  Result Value Ref Range Status   Specimen Description   Final    URINE, CATHETERIZED Performed at [facility] [address], [place], [place] [address]    Special Requests   Final    NONE Performed at [facility], [place] [address], [place], [place] [address]    Culture (A)  Final    >=100,000 COLONIES/mL VANCOMYCIN RESISTANT ENTEROCOCCUS ISOLATED   Report Status 06/23/2021 FINAL  Final   Organism ID, Bacteria VANCOMYCIN RESISTANT ENTEROCOCCUS ISOLATED (A)  Final      Susceptibility   Vancomycin resistant enterococcus isolated - MIC*    AMPICILLIN <=2 SENSITIVE Sensitive      NITROFURANTOIN <=16 SENSITIVE Sensitive     VANCOMYCIN >=32 RESISTANT Resistant     LINEZOLID 2 SENSITIVE Sensitive     * >=100,000 COLONIES/mL VANCOMYCIN RESISTANT ENTEROCOCCUS ISOLATED  Blood culture (routine x 2)     Status: None (Preliminary result)   Collection Time: 06/21/21  1:28 AM   Specimen: BLOOD  Result Value Ref Range Status   Specimen Description   Final    BLOOD BLOOD RIGHT FOREARM Performed at [facility] [address], [place], [place] [address]    Special Requests   Final    BOTTLES DRAWN AEROBIC AND ANAEROBIC Blood Culture adequate volume Performed at [facility] [address] [place], [place] [address]    Culture   Final    NO GROWTH 3 DAYS Performed at [facility], [place] [address], [place], [place] [address]    Report Status PENDING  Incomplete       Radiology Studies: No results found.    Scheduled Meds:  sodium chloride   Intravenous Once   amLODipine  2.5 mg Oral Daily   amoxicillin  500 mg Oral Q8H   atorvastatin  10 mg Oral Daily   Chlorhexidine Gluconate Cloth  6 each Topical Daily   insulin aspart  0-9 Units Subcutaneous TID WC   levothyroxine  75 mcg Oral Q0600   pantoprazole  40 mg Oral Daily   Continuous Infusions:     LOS: 3 days      Time spent: 25 minutes   Dessa Phi, DO [facility] 06/24/2021, 12:06 PM   Available via Epic secure chat 7am-7pm After these hours, please refer to coverage provider listed on [email or web address]

## 2021-06-24 NOTE — Care Management Important Message (Signed)
Important Message  Patient Details IM Letter placed in Patient's room. Name: Cindy Gordon MRN: [id number] Date of Birth: 21-Oct-1941   Medicare Important Message Given:  Yes     Kerin Salen 06/24/2021, 2:50 PM

## 2021-06-24 NOTE — Evaluation (Signed)
Physical Therapy Evaluation Patient Details Name: Cindy Gordon MRN: [id number] DOB: 1941/01/11 Today's Date: 06/24/2021   History of Present Illness  Patient is an 80 year old female with recent COVID infection, presents to ED with hematuria. CT abdomen pelvis shows clot in the urinary bladder and also interstitial cystitis findings and ascending infection. Patient treated antibiotics, given Kcentra, started on continuous bladder irrigation. PMH includes B shoulder replacements with staph infection, hypertension, diabetes mellitus, hypothyroidism  Clinical Impression  Pt admitted with above diagnosis. Pt currently with functional limitations due to the deficits listed below (see PT Problem List). Pt will benefit from skilled PT to increase their independence and safety with mobility to allow discharge to the venue listed below.  Pt appears to be mostly assisted with bed mobility and transfers at baseline (per reviewing chart).  Pt required total assist for bed mobility and required trunk support with sitting EOB.  Recommend pt return to SNF level care upon d/c.     Follow Up Recommendations SNF    Equipment Recommendations  None recommended by PT    Recommendations for Other Services       Precautions / Restrictions Precautions Precautions: Fall      Mobility  Bed Mobility Overal bed mobility: Needs Assistance Bed Mobility: Supine to Sit;Sit to Supine     Supine to sit: Total assist Sit to supine: Max assist   General bed mobility comments: pt initially LEs movement however requiring significant assist for both upper and lower body, pt fatigued quickly at EOB and assisted back to supine; repositioned pt with [place] bed to assist,  floated heels    Transfers                 General transfer comment: total assist for bed mobility and required trunk support for sitting EOB, deferred transfer today  Ambulation/Gait                Stairs             Wheelchair Mobility    Modified Rankin (Stroke Patients Only)       Balance Overall balance assessment: Needs assistance Sitting-balance support: Bilateral upper extremity supported;Feet supported Sitting balance-Leahy Scale: Zero                                       Pertinent Vitals/Pain Pain Assessment: No/denies pain Pain Intervention(s): Repositioned;Monitored during session    Home Living Family/patient expects to be discharged to:: Skilled nursing facility                      Prior Function Level of Independence: Needs assistance   Gait / Transfers Assistance Needed: pt reports not ambulating since "dog bite" one year ago; reports physical assist required for bed mobility and transfers to w/c     Comments: per OT: "spoke with patient's son, reports patient has not ambulated for ~year due to "no cartilage" in knee and hx of dog attack to L LE. reprots "when they helped" at facility patient needed assistance for functional transfers to wheelchair, bathing, toileting and feeding. Son would frequently have to feed patient himself and would advocate to get patient bathed. Son is hoping to get patient into different nursing facility."     Hand Dominance   Dominant Hand: Right    Extremity/Trunk Assessment        Lower Extremity Assessment Lower Extremity  Assessment: Generalized weakness (grossly observed only 2+/5 at best for bil LEs)       Communication   Communication: No difficulties (low volume)  Cognition Arousal/Alertness: Awake/alert Behavior During Therapy: Flat affect Overall Cognitive Status: History of cognitive impairments - at baseline                                 General Comments: patient knows shes in hospital but states [place]. will follow cues with increased time, reports feeling very tired      General Comments      Exercises     Assessment/Plan    PT Assessment Patient needs continued PT  services  PT Problem List Decreased balance;Decreased knowledge of use of DME;Decreased strength;Decreased mobility;Decreased activity tolerance       PT Treatment Interventions DME instruction;Balance training;Therapeutic exercise;Functional mobility training;Patient/family education;Therapeutic activities;Wheelchair mobility training    PT Goals (Current goals can be found in the Care Plan section)  Acute Rehab PT Goals PT Goal Formulation: With patient Time For Goal Achievement: 07/08/21 Potential to Achieve Goals: Fair    Frequency Min 1X/week   Barriers to discharge        Co-evaluation               AM-PAC PT "6 Clicks" Mobility  Outcome Measure Help needed turning from your back to your side while in a flat bed without using bedrails?: Total Help needed moving from lying on your back to sitting on the side of a flat bed without using bedrails?: Total Help needed moving to and from a bed to a chair (including a wheelchair)?: Total Help needed standing up from a chair using your arms (e.g., wheelchair or bedside chair)?: Total Help needed to walk in hospital room?: Total Help needed climbing 3-5 steps with a railing? : Total 6 Click Score: 6    End of Session Equipment Utilized During Treatment: Oxygen Activity Tolerance: Patient tolerated treatment well Patient left: with call bell/phone within reach;in bed;with bed alarm set Nurse Communication: Mobility status PT Visit Diagnosis: Muscle weakness (generalized) (M62.81);Other abnormalities of gait and mobility (R26.89)    Time: 0953-1006 PT Time Calculation (min) (ACUTE ONLY): 13 min   Charges:   PT Evaluation $PT Eval Low Complexity: 1 Low        Kati PT, DPT Acute Rehabilitation Services Pager: [phone] Office: [phone]   York Ram E 06/24/2021, 12:26 PM

## 2021-06-25 DIAGNOSIS — Z723 Lack of physical exercise: Secondary | ICD-10-CM | POA: Diagnosis not present

## 2021-06-25 DIAGNOSIS — N3091 Cystitis, unspecified with hematuria: Secondary | ICD-10-CM | POA: Diagnosis not present

## 2021-06-25 DIAGNOSIS — R310 Gross hematuria: Secondary | ICD-10-CM | POA: Diagnosis not present

## 2021-06-25 DIAGNOSIS — Z7901 Long term (current) use of anticoagulants: Secondary | ICD-10-CM | POA: Diagnosis not present

## 2021-06-25 DIAGNOSIS — R2681 Unsteadiness on feet: Secondary | ICD-10-CM | POA: Diagnosis not present

## 2021-06-25 DIAGNOSIS — R319 Hematuria, unspecified: Secondary | ICD-10-CM | POA: Diagnosis not present

## 2021-06-25 DIAGNOSIS — N390 Urinary tract infection, site not specified: Secondary | ICD-10-CM | POA: Diagnosis not present

## 2021-06-25 DIAGNOSIS — G4733 Obstructive sleep apnea (adult) (pediatric): Secondary | ICD-10-CM | POA: Diagnosis not present

## 2021-06-25 DIAGNOSIS — R112 Nausea with vomiting, unspecified: Secondary | ICD-10-CM | POA: Diagnosis not present

## 2021-06-25 DIAGNOSIS — E119 Type 2 diabetes mellitus without complications: Secondary | ICD-10-CM | POA: Diagnosis not present

## 2021-06-25 DIAGNOSIS — Z7401 Bed confinement status: Secondary | ICD-10-CM | POA: Diagnosis not present

## 2021-06-25 DIAGNOSIS — E1142 Type 2 diabetes mellitus with diabetic polyneuropathy: Secondary | ICD-10-CM | POA: Diagnosis not present

## 2021-06-25 DIAGNOSIS — K219 Gastro-esophageal reflux disease without esophagitis: Secondary | ICD-10-CM | POA: Diagnosis not present

## 2021-06-25 DIAGNOSIS — Z743 Need for continuous supervision: Secondary | ICD-10-CM | POA: Diagnosis not present

## 2021-06-25 DIAGNOSIS — I700 Atherosclerosis of aorta: Secondary | ICD-10-CM | POA: Diagnosis not present

## 2021-06-25 DIAGNOSIS — I2510 Atherosclerotic heart disease of native coronary artery without angina pectoris: Secondary | ICD-10-CM | POA: Diagnosis not present

## 2021-06-25 DIAGNOSIS — M797 Fibromyalgia: Secondary | ICD-10-CM | POA: Diagnosis not present

## 2021-06-25 DIAGNOSIS — R42 Dizziness and giddiness: Secondary | ICD-10-CM | POA: Diagnosis not present

## 2021-06-25 DIAGNOSIS — E782 Mixed hyperlipidemia: Secondary | ICD-10-CM | POA: Diagnosis not present

## 2021-06-25 DIAGNOSIS — E034 Atrophy of thyroid (acquired): Secondary | ICD-10-CM | POA: Diagnosis not present

## 2021-06-25 DIAGNOSIS — R6889 Other general symptoms and signs: Secondary | ICD-10-CM | POA: Diagnosis not present

## 2021-06-25 DIAGNOSIS — D62 Acute posthemorrhagic anemia: Secondary | ICD-10-CM | POA: Diagnosis not present

## 2021-06-25 DIAGNOSIS — R531 Weakness: Secondary | ICD-10-CM | POA: Diagnosis not present

## 2021-06-25 DIAGNOSIS — I10 Essential (primary) hypertension: Secondary | ICD-10-CM | POA: Diagnosis not present

## 2021-06-25 DIAGNOSIS — Z8616 Personal history of COVID-19: Secondary | ICD-10-CM | POA: Diagnosis not present

## 2021-06-25 DIAGNOSIS — L89152 Pressure ulcer of sacral region, stage 2: Secondary | ICD-10-CM | POA: Diagnosis not present

## 2021-06-25 LAB — GLUCOSE, CAPILLARY
Glucose-Capillary: 104 mg/dL — ABNORMAL HIGH (ref 70–99)
Glucose-Capillary: 88 mg/dL (ref 70–99)
Glucose-Capillary: 217 mg/dL — ABNORMAL HIGH (ref 70–99)

## 2021-06-25 LAB — CBC
HCT: 31.8 % — ABNORMAL LOW (ref 36.0–46.0)
Hemoglobin: 9.7 g/dL — ABNORMAL LOW (ref 12.0–15.0)
MCH: 29.5 pg (ref 26.0–34.0)
MCHC: 30.5 g/dL (ref 30.0–36.0)
MCV: 96.7 fL (ref 80.0–100.0)
Platelets: 156 10*3/uL (ref 150–400)
RBC: 3.29 MIL/uL — ABNORMAL LOW (ref 3.87–5.11)
RDW: 19 % — ABNORMAL HIGH (ref 11.5–15.5)
WBC: 7.1 10*3/uL (ref 4.0–10.5)
nRBC: 0 % (ref 0.0–0.2)

## 2021-06-25 MED ORDER — AMOXICILLIN 500 MG PO CAPS: 500.0000 mg | ORAL_CAPSULE | Freq: Three times a day (TID) | ORAL | 0 refills | Status: AC

## 2021-06-25 NOTE — TOC Progression Note (Signed)
Transition of Care [facility]) - Progression Note    Patient Details  Name: Cindy Gordon MRN: [id number] Date of Birth: May 23, 1941  Transition of Care [facility]) CM/SW Contact  Effrey Davidow, Juliann Pulse, RN Phone Number: 06/25/2021, 10:02 AM  Clinical Narrative:  Vedia Coffer Lavone Neri ID-[id number];[facility] ID-[id number]. [facility] rep Tracey following.    Expected Discharge Plan: Skilled Nursing Facility Barriers to Discharge: No Barriers Identified  Expected Discharge Plan and Services Expected Discharge Plan: [place]                                               Social Determinants of Health (SDOH) Interventions    Readmission Risk Interventions No flowsheet data found.

## 2021-06-25 NOTE — Progress Notes (Signed)
Report called to [place] at [facility] in [place].  Discharged with [facility]. Andre Lefort

## 2021-06-25 NOTE — TOC Transition Note (Signed)
Transition of Care [facility]) - CM/SW Discharge Note   Patient Details  Name: Waleska Buttery MRN: [id number] Date of Birth: Feb 21, 1941  Transition of [facility]) CM/SW Contact:  Ross Ludwig, LCSW Phone Number: 06/25/2021, 10:57 AM   Clinical Narrative:    Patient to be d/c'ed today to [facility].  Patient and family agreeable to plans will transport via ems RN to call report to [phone].  Tried to call son unable to leave message on voice mail.   Final next level of care: Skilled Nursing Facility Barriers to Discharge: Barriers Resolved   Patient Goals and CMS Choice Patient states their goals for this hospitalization and ongoing recovery are:: To go to SNF for rehab, then return back home. CMS Medicare.gov Compare Post Acute Care list provided to:: Patient Choice offered to / list presented to : Patient  Discharge Placement PASRR number recieved: 06/24/21            Patient chooses bed at: [facility] Patient to be transferred to facility by: [place] Name of family member notified: Tried to call son Shanon Brow unable to leave a message on voice mail. Patient and family notified of of transfer: 06/25/21  Discharge Plan and Services                                     Social Determinants of Health (SDOH) Interventions     Readmission Risk Interventions No flowsheet data found.

## 2021-06-25 NOTE — Plan of Care (Signed)

## 2021-06-25 NOTE — Discharge Summary (Signed)
Physician Discharge Summary  Cindy Gordon OTL:[id number] DOB: 04-Aug-1941 DOA: 06/20/2021  PCP: Rochel Brome, MD  Admit date: 06/20/2021 Discharge date: 06/25/2021  Admitted From: SNF Disposition:  SNF   Recommendations for Outpatient Follow-up:  Follow up with PCP in 1 week Follow up with Urology in 1 week. Foley catheter to be kept in for 1 week and follow-up outpatient for voiding trial, will need cystoscopy as outpatient  Discharge Condition: Stable CODE STATUS: DNR  Diet recommendation:  Diet Orders (From admission, onward)     Start     Ordered   06/22/21 1800  DIET DYS 2 Room service appropriate? Yes; Fluid consistency: Thin  Diet effective now       Comments: Carb Mod  Question Answer Comment  Room service appropriate? Yes   Fluid consistency: Thin      06/22/21 1800           Brief/Interim Summary: Cindy Gordon is a 80 y.o. female with history of hypertension, diabetes mellitus, hypothyroidism per report was recently diagnosed about 3 weeks ago with COVID infection at that time per the nursing home protocol was placed on apixaban. She was brought to the emergency department with concerns for hematuria. CT abdomen pelvis done shows features concerning for clot in the urinary bladder and also interstitial cystitis findings and ascending infection. Patient was treated antibiotics and patient also was given Kcentra, started on continuous bladder irrigation. Urology was consulted. They recommended Foley catheter to be kept in for 1 week and follow-up outpatient for voiding trial, will need cystoscopy as outpatient.  Urine culture was positive for Enterococcus, VRE.  Patient tolerated amoxicillin without allergic reaction noted.  Patient was given 1 unit packed red blood cell for blood loss anemia, with stabilization in her hemoglobin.  Patient was discharged to SNF.  Discharge Diagnoses:  Principal Problem:   Hematuria Active Problems:   Essential hypertension, benign    Controlled type 2 diabetes mellitus with diabetic polyneuropathy, without long-term current use of insulin (HCC)   ABLA (acute blood loss anemia)   Pressure injury of skin  Hematuria -Holding Eliquis, doubt she needs to resume this.  Eliquis was started as outpatient for VTE prophylaxis in setting of COVID infection several weeks ago -Status post [facility] -Foley catheter to be kept in for 1 week and follow-up outpatient for voiding trial, will need cystoscopy as outpatient -Foley catheter with yellow urine this morning  UTI, present on admission -Urine culture showing Enterococcus, VRE -Rocephin --> Amoxicillin per pharmacy. I asked patient regarding her allergy listed for pcn "itching". She cannot recall when she had this reaction, denies anaphylactic reaction. She is agreeable to trying amoxicillin, no reaction noted  Acute blood loss anemia -Baseline hemoglobin around 13, admitted with hemoglobin 10.4 and trended down to 6.7 today -Status post 1 unit packed red blood cell 8/14 -Hemoglobin stable today   Hypertension -Continue Norvasc  Hyperlipidemia -Continue Lipitor   Hypothyroidism -Continue Synthroid   Diabetes mellitus -Continue sliding scale insulin  GERD -Continue PPI   In agreement with assessment of the pressure ulcer as below:  Pressure Injury 06/21/21 Coccyx Mid Stage 2 -  Partial thickness loss of dermis presenting as a shallow open injury with a red, pink wound bed without slough. (Active)  06/21/21 2047  Location: Coccyx  Location Orientation: Mid  Staging: Stage 2 -  Partial thickness loss of dermis presenting as a shallow open injury with a red, pink wound bed without slough.  Wound Description (Comments):  Present on Admission: Yes    Nutrition Problem: Inadequate oral intake Etiology: acute illness, lethargy/confusion  Discharge Instructions  Discharge Instructions     Increase activity slowly   Complete by: As directed     No wound care   Complete by: As directed       Allergies as of 06/25/2021       Reactions   Contrast Media [iodinated Diagnostic Agents] Other (See Comments)   Cardiac arrest   Hydrocodone Other (See Comments)   Hallucinations   Codeine    Crestor [rosuvastatin] Other (See Comments)   Myalgia   Lisinopril    Naproxen    constipation   Niaspan [niacin] Other (See Comments)   flushing   Penicillins    Itchy.        Medication List     STOP taking these medications    cefTRIAXone  IVPB Commonly known as: ROCEPHIN   dexamethasone 6 MG tablet Commonly known as: DECADRON   OxyCODONE HCl (Abuse Deter) 5 MG Taba Commonly known as: OXAYDO   traMADol 50 MG tablet Commonly known as: ULTRAM       TAKE these medications    amLODipine 2.5 MG tablet Commonly known as: NORVASC TAKE ONE TABLET BY MOUTH EVERY DAY   amoxicillin 500 MG capsule Commonly known as: AMOXIL Take 1 capsule (500 mg total) by mouth every 8 (eight) hours for 9 days.   aspirin EC 81 MG tablet Take 81 mg by mouth daily. Swallow whole.   atorvastatin 10 MG tablet Commonly known as: LIPITOR Take 10 mg by mouth daily.   Biotin 1000 MCG tablet Take 1,000 mcg by mouth daily.   cholecalciferol 25 MCG (1000 UNIT) tablet Commonly known as: VITAMIN D3 Take 2,000 Units by mouth daily.   [employer] Vitamin D3 250 MCG (10000 UT) Caps Generic drug: Cholecalciferol Take 2 capsules by mouth daily. For 30 days for COVID   levothyroxine 75 MCG tablet Commonly known as: SYNTHROID Take 75 mcg by mouth daily.   Mucinex 600 MG 12 hr tablet Generic drug: guaiFENesin Take 600 mg by mouth 2 (two) times daily.   multivitamin tablet Take 1 tablet by mouth daily.   ondansetron 4 MG tablet Commonly known as: ZOFRAN Take 4 mg by mouth every 6 (six) hours as needed for nausea or vomiting.   ONE TOUCH ULTRA MINI w/Device Kit Use daily to check blood sugar levels. E11.42   OneTouch Ultra test strip Generic  drug: glucose blood CHECK BLOOD SUGAR EVERY DAY   pantoprazole 40 MG tablet Commonly known as: PROTONIX Take 1 tablet (40 mg total) by mouth daily.   pregabalin 300 MG capsule Commonly known as: LYRICA TAKE ONE CAPSULE BY MOUTH 2 TIMES A DAY   Savella 100 MG Tabs tablet Generic drug: Milnacipran HCl Take 1 tablet (100 mg total) by mouth 2 (two) times daily.   vitamin B-12 1000 MCG tablet Commonly known as: CYANOCOBALAMIN Take 1,000 mcg by mouth daily.   vitamin C 500 MG tablet Commonly known as: ASCORBIC ACID Take 500 mg by mouth 2 (two) times daily.        Follow-up Information     Cox, Kirsten, MD. Schedule an appointment as soon as possible for a visit in 1 week(s).   Specialties: Internal Medicine, Interventional Cardiology, Radiology, Anesthesiology Contact information: [place] [address] [address]. Schedule an appointment as soon as possible for a  visit in 1 week(s).   Why: Foley catheter to be kept in for 1 week and follow-up outpatient for voiding trial, will need cystoscopy as outpatient Contact information: [place] [address] [phone]               Allergies  Allergen Reactions   Contrast Media [Iodinated Diagnostic Agents] Other (See Comments)    Cardiac arrest   Hydrocodone Other (See Comments)    Hallucinations   Codeine    Crestor [Rosuvastatin] Other (See Comments)    Myalgia    Lisinopril    Naproxen     constipation   Niaspan [Niacin] Other (See Comments)    flushing   Penicillins     Itchy.    Consultations: Urology    Procedures/Studies: CT ABDOMEN PELVIS WO CONTRAST  Result Date: 06/21/2021 CLINICAL DATA:  Vaginal bleeding, possibly UTI, passage of vaginal clot EXAM: CT ABDOMEN AND PELVIS WITHOUT CONTRAST TECHNIQUE: Multidetector CT imaging of the abdomen and pelvis was performed following the standard protocol  without IV contrast. COMPARISON:  03/23/2021 FINDINGS: Lower chest: Bibasilar atelectasis and/or scarring. Lung bases are clear. Normal heart size. No pericardial effusion. Coronary artery atherosclerosis. Abundant mediastinal and epicardial fat. Atherosclerotic calcification of the thoracic aorta. Hepatobiliary: Pneumobilia and surgical changes noted in the region of the gallbladder fossa. No concerning focal parenchymal lesion. Normal hepatic attenuation. Smooth liver surface contour. No visible calcified gallstones. Pancreas: Pancreatic atrophy is similar to prior. No pancreatic ductal dilatation or surrounding inflammatory changes. Spleen: Normal in size. No concerning splenic lesions. Adrenals/Urinary Tract: Normal adrenal glands. Mild bilateral renal atrophy. Faint bilateral perinephric stranding and mild hydroureteronephrosis, left greater than right, with bilateral extrarenal pelves. No obstructive urolithiasis. Circumferential thickening of the urinary bladder. Heterogeneous material noted within the bladder lumen, suspect hemorrhagic products. Stomach/Bowel: Distal esophagus, stomach and duodenal sweep are unremarkable. No small bowel wall thickening or dilatation. No evidence of obstruction. Appendix is not visualized. No focal inflammation the vicinity of the cecum to suggest an occult appendicitis. No colonic dilatation or wall thickening. Scattered sigmoid colonic diverticula without focal inflammation to suggest diverticulitis. Vascular/Lymphatic: Atherosclerotic calcifications within the abdominal aorta and branch vessels. No aneurysm or ectasia. No enlarged abdominopelvic lymph nodes. Reproductive: Hysterectomy. No gross abnormality of the hysterectomy cuff. No worrisome adnexal lesion. Other: Fat containing ventral hernia with small surgical clip. Additional small umbilical and infraumbilical fat containing hernias as well. Postsurgical changes the anterior abdominal wall. Abdominal wall laxity and  rectus diastasis. No free abdominopelvic air or fluid. Musculoskeletal: Multilevel degenerative changes are present in the imaged portions of the spine. No acute osseous abnormality or suspicious osseous lesion. IMPRESSION: Circumferential thickening of the urinary bladder containing intermediate attenuation material, likely hemorrhagic products which would explain the patient's transvaginal passage of blood clots. Some mild hydroureteronephrosis is noted as well with faint perinephric stranding. Can reflect hemorrhagic cystitis with ascending tract infection. Correlate with urinalysis. Prior hysterectomy.  No gross abnormality of the vaginal cuff. Colonic diverticulosis without evidence of acute diverticulitis. Prior cholecystectomy and likely biliary manipulation with chronic pneumobilia. Aortic Atherosclerosis (ICD10-I70.0). Coronary artery atherosclerosis. Stable ventral fat containing hernias. Electronically Signed   By: Lovena Le M.D.   On: 06/21/2021 00:37   CT HEAD WO CONTRAST (5MM)  Result Date: 06/20/2021 CLINICAL DATA:  Altered mental status EXAM: CT HEAD WITHOUT CONTRAST TECHNIQUE: Contiguous axial images were obtained from the base of the skull through the vertex without intravenous contrast. COMPARISON:  03/23/2021 FINDINGS: Brain:  No evidence of acute infarction, hemorrhage, hydrocephalus, extra-axial collection or mass lesion/mass effect. Subcortical white matter and periventricular small vessel ischemic changes. Vascular: No hyperdense vessel or unexpected calcification. Skull: Normal. Negative for fracture or focal lesion. Sinuses/Orbits: The visualized paranasal sinuses are essentially clear. The mastoid air cells are unopacified. Other: None. IMPRESSION: No evidence of acute intracranial abnormality. Small vessel ischemic changes. Electronically Signed   By: Julian Hy M.D.   On: 06/20/2021 23:28   DG Chest Portable 1 View  Result Date: 06/20/2021 CLINICAL DATA:  Shortness of  breath EXAM: PORTABLE CHEST 1 VIEW COMPARISON:  03/23/2021 FINDINGS: Cardiomegaly. Aortic atherosclerosis. Left basilar atelectasis or infiltrate. No visible effusions or acute bony abnormality. IMPRESSION: Left base atelectasis or infiltrate. Electronically Signed   By: Rolm Baptise M.D.   On: 06/20/2021 22:52       Discharge Exam: Vitals:   06/24/21 2044 06/25/21 0455  BP: (!) 118/52 (!) 136/59  Pulse: 73 74  Resp: 18 18  Temp: 98.2 F (36.8 C) 98.4 F (36.9 C)  SpO2: 99% 100%    General: Pt is alert, awake, not in acute distress Cardiovascular: RRR, S1/S2 +, no edema Respiratory: CTA bilaterally, no wheezing, no rhonchi, no respiratory distress, no conversational dyspnea  Abdominal: Soft, NT, ND, bowel sounds + Extremities: no edema, no cyanosis Psych: Stable   The results of significant diagnostics from this hospitalization (including imaging, microbiology, ancillary and laboratory) are listed below for reference.     Microbiology: Recent Results (from the past 240 hour(s))  Blood culture (routine x 2)     Status: None (Preliminary result)   Collection Time: 06/20/21 10:40 PM   Specimen: BLOOD  Result Value Ref Range Status   Specimen Description   Final    BLOOD RIGHT ANTECUBITAL Performed at [facility] [address] [place], [place] [address]    Special Requests   Final    BOTTLES DRAWN AEROBIC AND ANAEROBIC Blood Culture adequate volume Performed at [facility] [address] [place], [place] [address]    Culture   Final    NO GROWTH 3 DAYS Performed at [facility], [place] [address], [place], [place] [address]    Report Status PENDING  Incomplete  Resp Panel by RT-PCR (Flu A&B, Covid) Nasopharyngeal Swab     Status: None   Collection Time: 06/21/21 12:56 AM   Specimen: Nasopharyngeal Swab; Nasopharyngeal(NP) swabs in vial transport medium  Result Value Ref Range Status   SARS Coronavirus 2 by RT PCR NEGATIVE  NEGATIVE Final    Comment: (NOTE) SARS-CoV-2 target nucleic acids are NOT DETECTED.  The SARS-CoV-2 RNA is generally detectable in upper respiratory specimens during the acute phase of infection. The lowest concentration of SARS-CoV-2 viral copies this assay can detect is 138 copies/mL. A negative result does not preclude SARS-Cov-2 infection and should not be used as the sole basis for treatment or other patient management decisions. A negative result may occur with  improper specimen collection/handling, submission of specimen other than nasopharyngeal swab, presence of viral mutation(s) within the areas targeted by this assay, and inadequate number of viral copies(<138 copies/mL). A negative result must be combined with clinical observations, patient history, and epidemiological information. The expected result is Negative.  Fact Sheet for Patients:  EntrepreneurPulse.com.au  Fact Sheet for Healthcare Providers:  IncredibleEmployment.be  This test is no t yet approved or cleared by the [place] FDA and  has been authorized for detection and/or diagnosis of  SARS-CoV-2 by FDA under an Emergency Use Authorization (EUA). This EUA will remain  in effect (meaning this test can be used) for the duration of the COVID-19 declaration under Section 564(b)(1) of the Act, 21 U.S.C.section 360bbb-3(b)(1), unless the authorization is terminated  or revoked sooner.       Influenza A by PCR NEGATIVE NEGATIVE Final   Influenza B by PCR NEGATIVE NEGATIVE Final    Comment: (NOTE) The Xpert Xpress SARS-CoV-2/FLU/RSV plus assay is intended as an aid in the diagnosis of influenza from Nasopharyngeal swab specimens and should not be used as a sole basis for treatment. Nasal washings and aspirates are unacceptable for Xpert Xpress SARS-CoV-2/FLU/RSV testing.  Fact Sheet for Patients: EntrepreneurPulse.com.au  Fact Sheet for Healthcare  Providers: IncredibleEmployment.be  This test is not yet approved or cleared by the [place] FDA and has been authorized for detection and/or diagnosis of SARS-CoV-2 by FDA under an Emergency Use Authorization (EUA). This EUA will remain in effect (meaning this test can be used) for the duration of the COVID-19 declaration under Section 564(b)(1) of the Act, 21 U.S.C. section 360bbb-3(b)(1), unless the authorization is terminated or revoked.  Performed at [facility], [place] [address] [place], [place] [address]   Urine Culture     Status: Abnormal   Collection Time: 06/21/21  1:13 AM   Specimen: Urine, Catheterized  Result Value Ref Range Status   Specimen Description   Final    URINE, CATHETERIZED Performed at [facility] [address] [place], [place] [address]    Special Requests   Final    NONE Performed at [facility], [place] [address], [place], [place] [address]    Culture (A)  Final    >=100,000 COLONIES/mL VANCOMYCIN RESISTANT ENTEROCOCCUS ISOLATED   Report Status 06/23/2021 FINAL  Final   Organism ID, Bacteria VANCOMYCIN RESISTANT ENTEROCOCCUS ISOLATED (A)  Final      Susceptibility   Vancomycin resistant enterococcus isolated - MIC*    AMPICILLIN <=2 SENSITIVE Sensitive     NITROFURANTOIN <=16 SENSITIVE Sensitive     VANCOMYCIN >=32 RESISTANT Resistant     LINEZOLID 2 SENSITIVE Sensitive     * >=100,000 COLONIES/mL VANCOMYCIN RESISTANT ENTEROCOCCUS ISOLATED  Blood culture (routine x 2)     Status: None (Preliminary result)   Collection Time: 06/21/21  1:28 AM   Specimen: BLOOD  Result Value Ref Range Status   Specimen Description   Final    BLOOD BLOOD RIGHT FOREARM Performed at [facility] [address], [place], [place] [address]    Special Requests   Final    BOTTLES DRAWN AEROBIC AND ANAEROBIC Blood Culture adequate volume Performed at [facility] [address], [place], [place] [address]    Culture   Final    NO GROWTH 3 DAYS Performed at [facility], [place] [address] [place], [place] [address]    Report Status PENDING  Incomplete  SARS CORONAVIRUS 2 (TAT 6-24 HRS) Nasopharyngeal Nasopharyngeal Swab     Status: None   Collection Time: 06/24/21 10:45 AM   Specimen: Nasopharyngeal Swab  Result Value Ref Range Status   SARS Coronavirus 2 NEGATIVE NEGATIVE Final    Comment: (NOTE) SARS-CoV-2 target nucleic acids are NOT DETECTED.  The SARS-CoV-2 RNA is generally detectable in upper and lower respiratory specimens during the acute phase of infection. Negative results do not preclude SARS-CoV-2 infection, do not rule out co-infections with other pathogens, and  should not be used as the sole basis for treatment or other patient management decisions. Negative results must be combined with clinical observations, patient history, and epidemiological information. The expected result is Negative.  Fact Sheet for Patients: SugarRoll.be  Fact Sheet for Healthcare Providers: https://www.woods-mathews.com/  This test is not yet approved or cleared by the [place] FDA and  has been authorized for detection and/or diagnosis of SARS-CoV-2 by FDA under an Emergency Use Authorization (EUA). This EUA will remain  in effect (meaning this test can be used) for the duration of the COVID-19 declaration under Se ction 564(b)(1) of the Act, 21 U.S.C. section 360bbb-3(b)(1), unless the authorization is terminated or revoked sooner.  Performed at [facility], [place] [address] [place], [place] [address]      Labs: BNP (last 3 results) No results for input(s): BNP in the last 8760 hours. Basic Metabolic Panel: Recent Labs  Lab 06/20/21 2240 06/21/21 0552 06/22/21 0553 06/23/21 0511 06/24/21 0413  NA 135 135 136 139 137  K 3.2* 2.9* 3.2* 4.1 3.6  CL 101 102  106 111 112*  CO2 21* 21* 19* 22 21*  GLUCOSE 151* 133* 100* 105* 89  BUN _0 CREATININE 1.11* 1.07* 0.90 0.94 0.90  CALCIUM 8.6* 8.4* 8.0* 8.2* 8.1*  MG  --   --   --  1.7  --    Liver Function Tests: Recent Labs  Lab 06/20/21 2240  AST 18  ALT 22  ALKPHOS 92  BILITOT 0.4  PROT 6.2*  ALBUMIN 2.2*   No results for input(s): LIPASE, AMYLASE in the last 168 hours. No results for input(s): AMMONIA in the last 168 hours. CBC: Recent Labs  Lab 06/22/21 0553 06/23/21 0511 06/23/21 0804 06/23/21 1850 06/24/21 0413 06/25/21 0411 06/25/21 0831  WBC 19.5* 11.4*  --   --  7.3 QUESTIONABLE RESULTS, RECOMMEND RECOLLECT TO VERIFY 7.1  HGB 8.5* 6.9* 6.7* 9.0* 8.8* QUESTIONABLE RESULTS, RECOMMEND RECOLLECT TO VERIFY 9.7*  HCT 26.8* 22.2* 21.3* 28.7* 28.4* QUESTIONABLE RESULTS, RECOMMEND RECOLLECT TO VERIFY 31.8*  MCV 88.7 90.2  --   --  93.7 QUESTIONABLE RESULTS, RECOMMEND RECOLLECT TO VERIFY 96.7  PLT 195 191  --   --  151 QUESTIONABLE RESULTS, RECOMMEND RECOLLECT TO VERIFY 156   Cardiac Enzymes: No results for input(s): CKTOTAL, CKMB, CKMBINDEX, TROPONINI in the last 168 hours. BNP: Invalid input(s): POCBNP CBG: Recent Labs  Lab 06/24/21 1113 06/24/21 1658 06/24/21 2035 06/25/21 0235 06/25/21 0734  GLUCAP 128* 90 90 104* 88   D-Dimer No results for input(s): DDIMER in the last 72 hours. Hgb A1c No results for input(s): HGBA1C in the last 72 hours. Lipid Profile No results for input(s): CHOL, HDL, LDLCALC, TRIG, CHOLHDL, LDLDIRECT in the last 72 hours. Thyroid function studies No results for input(s): TSH, T4TOTAL, T3FREE, THYROIDAB in the last 72 hours.  Invalid input(s): FREET3 Anemia work up No results for input(s): VITAMINB12, FOLATE, FERRITIN, TIBC, IRON, RETICCTPCT in the last 72 hours. Urinalysis    Component Value Date/Time   COLORURINE RED (A) 06/20/2021 2350   APPEARANCEUR (A) 06/20/2021 2350    TEST NOT REPORTED DUE TO COLOR INTERFERENCE OF  URINE PIGMENT   LABSPEC 1.015 06/20/2021 2350   PHURINE  06/20/2021 2350    TEST NOT REPORTED DUE TO COLOR INTERFERENCE OF URINE PIGMENT   GLUCOSEU (A) 06/20/2021 2350    TEST NOT REPORTED DUE TO COLOR INTERFERENCE OF URINE PIGMENT   HGBUR (A) 06/20/2021  2350    TEST NOT REPORTED DUE TO COLOR INTERFERENCE OF URINE PIGMENT   BILIRUBINUR (A) 06/20/2021 2350    TEST NOT REPORTED DUE TO COLOR INTERFERENCE OF URINE PIGMENT   BILIRUBINUR negative 12/24/2020 1008   BILIRUBINUR neg 07/23/2020 1152   KETONESUR (A) 06/20/2021 2350    TEST NOT REPORTED DUE TO COLOR INTERFERENCE OF URINE PIGMENT   PROTEINUR (A) 06/20/2021 2350    TEST NOT REPORTED DUE TO COLOR INTERFERENCE OF URINE PIGMENT   UROBILINOGEN 0.2 12/24/2020 1008   NITRITE (A) 06/20/2021 2350    TEST NOT REPORTED DUE TO COLOR INTERFERENCE OF URINE PIGMENT   LEUKOCYTESUR (A) 06/20/2021 2350    TEST NOT REPORTED DUE TO COLOR INTERFERENCE OF URINE PIGMENT   Sepsis Labs Invalid input(s): PROCALCITONIN,  WBC,  LACTICIDVEN Microbiology Recent Results (from the past 240 hour(s))  Blood culture (routine x 2)     Status: None (Preliminary result)   Collection Time: 06/20/21 10:40 PM   Specimen: BLOOD  Result Value Ref Range Status   Specimen Description   Final    BLOOD RIGHT ANTECUBITAL Performed at [facility], [place] [address], [place], [place] [address]    Special Requests   Final    BOTTLES DRAWN AEROBIC AND ANAEROBIC Blood Culture adequate volume Performed at [facility] [address] [place], [place] [address]    Culture   Final    NO GROWTH 3 DAYS Performed at [facility], [place] [address], [place],  [address]    Report Status PENDING  Incomplete  Resp Panel by RT-PCR (Flu A&B, Covid) Nasopharyngeal Swab     Status: None   Collection Time: 06/21/21 12:56 AM   Specimen: Nasopharyngeal Swab; Nasopharyngeal(NP) swabs in vial transport medium  Result Value Ref Range Status    SARS Coronavirus 2 by RT PCR NEGATIVE NEGATIVE Final    Comment: (NOTE) SARS-CoV-2 target nucleic acids are NOT DETECTED.  The SARS-CoV-2 RNA is generally detectable in upper respiratory specimens during the acute phase of infection. The lowest concentration of SARS-CoV-2 viral copies this assay can detect is 138 copies/mL. A negative result does not preclude SARS-Cov-2 infection and should not be used as the sole basis for treatment or other patient management decisions. A negative result may occur with  improper specimen collection/handling, submission of specimen other than nasopharyngeal swab, presence of viral mutation(s) within the areas targeted by this assay, and inadequate number of viral copies(<138 copies/mL). A negative result must be combined with clinical observations, patient history, and epidemiological information. The expected result is Negative.  Fact Sheet for Patients:  EntrepreneurPulse.com.au  Fact Sheet for Healthcare Providers:  IncredibleEmployment.be  This test is no t yet approved or cleared by the [place] FDA and  has been authorized for detection and/or diagnosis of SARS-CoV-2 by FDA under an Emergency Use Authorization (EUA). This EUA will remain  in effect (meaning this test can be used) for the duration of the COVID-19 declaration under Section 564(b)(1) of the Act, 21 U.S.C.section 360bbb-3(b)(1), unless the authorization is terminated  or revoked sooner.       Influenza A by PCR NEGATIVE NEGATIVE Final   Influenza B by PCR NEGATIVE NEGATIVE Final    Comment: (NOTE) The Xpert Xpress SARS-CoV-2/FLU/RSV plus assay is intended as an aid in the diagnosis of influenza from Nasopharyngeal swab specimens and should not be used as a sole basis for treatment. Nasal washings and aspirates are unacceptable for Xpert Xpress SARS-CoV-2/FLU/RSV testing.  Fact Sheet for  Patients: EntrepreneurPulse.com.au  Fact Sheet for Healthcare Providers: IncredibleEmployment.be  This test is not yet approved or cleared by the [place] FDA and has been authorized for detection and/or diagnosis of SARS-CoV-2 by FDA under an Emergency Use Authorization (EUA). This EUA will remain in effect (meaning this test can be used) for the duration of the COVID-19 declaration under Section 564(b)(1) of the Act, 21 U.S.C. section 360bbb-3(b)(1), unless the authorization is terminated or revoked.  Performed at [facility], [place] [address], [place], [place] [address]   Urine Culture     Status: Abnormal   Collection Time: 06/21/21  1:13 AM   Specimen: Urine, Catheterized  Result Value Ref Range Status   Specimen Description   Final    URINE, CATHETERIZED Performed at [place] [address] [place], [place] [address]    Special Requests   Final    NONE Performed at [facility], [place] [address], [place], [place] [address]    Culture (A)  Final    >=100,000 COLONIES/mL VANCOMYCIN RESISTANT ENTEROCOCCUS ISOLATED   Report Status 06/23/2021 FINAL  Final   Organism ID, Bacteria VANCOMYCIN RESISTANT ENTEROCOCCUS ISOLATED (A)  Final      Susceptibility   Vancomycin resistant enterococcus isolated - MIC*    AMPICILLIN <=2 SENSITIVE Sensitive     NITROFURANTOIN <=16 SENSITIVE Sensitive     VANCOMYCIN >=32 RESISTANT Resistant     LINEZOLID 2 SENSITIVE Sensitive     * >=100,000 COLONIES/mL VANCOMYCIN RESISTANT ENTEROCOCCUS ISOLATED  Blood culture (routine x 2)     Status: None (Preliminary result)   Collection Time: 06/21/21  1:28 AM   Specimen: BLOOD  Result Value Ref Range Status   Specimen Description   Final    BLOOD BLOOD RIGHT FOREARM Performed at [place] [address] [place], [place] [address]    Special Requests   Final    BOTTLES DRAWN  AEROBIC AND ANAEROBIC Blood Culture adequate volume Performed at [place] [address], [place], [place] [address]    Culture   Final    NO GROWTH 3 DAYS Performed at [facility], [place] [address], [place], [place] [address]    Report Status PENDING  Incomplete  SARS CORONAVIRUS 2 (TAT 6-24 HRS) Nasopharyngeal Nasopharyngeal Swab     Status: None   Collection Time: 06/24/21 10:45 AM   Specimen: Nasopharyngeal Swab  Result Value Ref Range Status   SARS Coronavirus 2 NEGATIVE NEGATIVE Final    Comment: (NOTE) SARS-CoV-2 target nucleic acids are NOT DETECTED.  The SARS-CoV-2 RNA is generally detectable in upper and lower respiratory specimens during the acute phase of infection. Negative results do not preclude SARS-CoV-2 infection, do not rule out co-infections with other pathogens, and should not be used as the sole basis for treatment or other patient management decisions. Negative results must be combined with clinical observations, patient history, and epidemiological information. The expected result is Negative.  Fact Sheet for Patients: SugarRoll.be  Fact Sheet for Healthcare Providers: https://www.woods-mathews.com/  This test is not yet approved or cleared by the [place] FDA and  has been authorized for detection and/or diagnosis of SARS-CoV-2 by FDA under an Emergency Use Authorization (EUA). This EUA will remain  in effect (meaning this test can be used) for the duration of the COVID-19 declaration under Se ction 564(b)(1) of the Act, 21 U.S.C. section 360bbb-3(b)(1), unless the authorization is terminated or revoked  sooner.  Performed at [facility], [place] [address] [place], [place] [address]      Patient was seen and examined on the day of discharge and was found to be in stable condition. Time coordinating discharge: 35 minutes including assessment and coordination of care, as  well as examination of the patient.   SIGNED:  Dessa Phi, DO [facility] 06/25/2021, 10:20 AM

## 2021-06-26 DIAGNOSIS — E119 Type 2 diabetes mellitus without complications: Secondary | ICD-10-CM | POA: Diagnosis not present

## 2021-06-26 LAB — CULTURE, BLOOD (ROUTINE X 2)
Culture: NO GROWTH
Culture: NO GROWTH
Special Requests: ADEQUATE
Special Requests: ADEQUATE

## 2021-06-28 ENCOUNTER — Telehealth: Payer: Self-pay

## 2021-06-28 NOTE — Chronic Care Management (AMB) (Addendum)
Chronic Care Management Pharmacy Assistant   Name: Anju Sereno  MRN: [id number] DOB: 23-Dec-1940   Reason for Encounter: Disease State/ Hypertension, Diabetes  Recent office visits:  01-01-2021 Erie Noe, LPN. Covid vaccine  Recent consult visits:  12-31-2020 Unable to view provider or notes  01-02-2021 Unable to view provider or notes  01-04-2021 Unable to view provider or notes  01-07-2021 Unable to view provider or notes  01-08-2021 Unable to view provider or notes  01-09-2021 Unable to view provider or notes  01-23-2021 Dartha Lodge, MD (Ortho). XR of femur   Hospital visits:  01-26-2021 for UTI. Discharged 01-26-2021 from [place] to view chart  Medication Reconciliation was completed by comparing discharge summary, patient's EMR and Pharmacy list, and upon discussion with patient.  Admitted to the hospital on 06-20-2021 due to Hemorrhagic cystitis. Discharge date was 06-25-2021. Discharged from [place]?Medications Started at [facility] Discharge:?? Amoxicillin 500 mg every 8 hours  Medication Changes at Hospital Discharge: None  Medications Discontinued at Hospital Discharge: Tramadol   Medications that remain the same after Hospital Discharge:??  -All other medications will remain the same.    Medications: Outpatient Encounter Medications as of 06/28/2021  Medication Sig Note   amLODipine (NORVASC) 2.5 MG tablet TAKE ONE TABLET BY MOUTH EVERY DAY    amoxicillin (AMOXIL) 500 MG capsule Take 1 capsule (500 mg total) by mouth every 8 (eight) hours for 9 days.    aspirin EC 81 MG tablet Take 81 mg by mouth daily. Swallow whole.    atorvastatin (LIPITOR) 10 MG tablet Take 10 mg by mouth daily.    Biotin 1000 MCG tablet Take 1,000 mcg by mouth daily.    Blood Glucose Monitoring Suppl (ONE TOUCH ULTRA MINI) w/Device KIT Use daily to check blood sugar levels. E11.42    Cholecalciferol ([employer] VITAMIN D3) 250 MCG (10000  UT) CAPS Take 2 capsules by mouth daily. For 30 days for COVID    cholecalciferol (VITAMIN D3) 25 MCG (1000 UNIT) tablet Take 2,000 Units by mouth daily. 06/21/2021: HOLD    glucose blood (ONETOUCH ULTRA) test strip CHECK BLOOD SUGAR EVERY DAY    guaiFENesin (MUCINEX) 600 MG 12 hr tablet Take 600 mg by mouth 2 (two) times daily.    levothyroxine (SYNTHROID) 75 MCG tablet Take 75 mcg by mouth daily.    Milnacipran HCl (SAVELLA) 100 MG TABS tablet Take 1 tablet (100 mg total) by mouth 2 (two) times daily.    Multiple Vitamin (MULTIVITAMIN) tablet Take 1 tablet by mouth daily.    ondansetron (ZOFRAN) 4 MG tablet Take 4 mg by mouth every 6 (six) hours as needed for nausea or vomiting.    pantoprazole (PROTONIX) 40 MG tablet Take 1 tablet (40 mg total) by mouth daily.    pregabalin (LYRICA) 300 MG capsule TAKE ONE CAPSULE BY MOUTH 2 TIMES A DAY    vitamin B-12 (CYANOCOBALAMIN) 1000 MCG tablet Take 1,000 mcg by mouth daily.    vitamin C (ASCORBIC ACID) 500 MG tablet Take 500 mg by mouth 2 (two) times daily.    No facility-administered encounter medications on file as of 06/28/2021.  Recent Relevant Labs: Lab Results  Component Value Date/Time   HGBA1C 5.5 12/24/2020 10:08 AM   HGBA1C 6.7 (H) 09/17/2020 10:14 AM   MICROALBUR 80 09/17/2020 10:18 AM    Kidney Function Lab Results  Component Value Date/Time   CREATININE 0.90 06/24/2021 04:13 AM   CREATININE 0.94 06/23/2021 05:11  AM   GFRNONAA >60 06/24/2021 04:13 AM   GFRAA 66 12/24/2020 10:08 AM    Recent Office Vitals: BP Readings from Last 3 Encounters:  06/25/21 (!) 136/59  12/24/20 136/68  12/19/20 130/63   Pulse Readings from Last 3 Encounters:  06/25/21 74  12/24/20 72  10/25/20 81    Wt Readings from Last 3 Encounters:  06/20/21 171 lb 15.3 oz (78 kg)  12/24/20 172 lb (78 kg)  10/25/20 167 lb (75.8 kg)     Kidney Function Lab Results  Component Value Date/Time   CREATININE 0.90 06/24/2021 04:13 AM   CREATININE 0.94  06/23/2021 05:11 AM   GFRNONAA >60 06/24/2021 04:13 AM   GFRAA 66 12/24/2020 10:08 AM    BMP Latest Ref Rng & Units 06/24/2021 06/23/2021 06/22/2021  Glucose 70 - 99 mg/dL 89 105(H) 100(H)  BUN 8 - 23 mg/dL '12 13 16  ' Creatinine 0.44 - 1.00 mg/dL 0.90 0.94 0.90  BUN/Creat Ratio 12 - 28 - - -  Sodium 135 - 145 mmol/L 137 139 136  Potassium 3.5 - 5.1 mmol/L 3.6 4.1 3.2(L)  Chloride 98 - 111 mmol/L 112(H) 111 106  CO2 22 - 32 mmol/L 21(L) 22 19(L)  Calcium 8.9 - 10.3 mg/dL 8.1(L) 8.2(L) 8.0(L)     Current antihypertensive regimen:  Amlodipine 2.5 daily  Patient verbally confirms she is taking the above medications as directed. Yes  How often are you checking your Blood Pressure? 1-2x per week  she checks her blood pressure in the morning before taking her medication.  Current home BP readings: Patient states she doesn't have any blood pressure readings  DATE:             BP               PULSE     Wrist or arm cuff: Arm Caffeine intake: 1 cup daily Salt intake: limited OTC medications including pseudoephedrine or NSAIDs? None  Any readings above 180/120? No   What recent interventions/DTPs have been made by any provider to improve Blood Pressure control since last CPP Visit:  None  Any recent hospitalizations or ED visits since last visit with CPP? Yes  What diet changes have been made to improve Blood Pressure Control?  Patient states she limits her salt intake  What exercise is being done to improve your Blood Pressure Control?  Patient states she tries to walk some  Adherence Review: Is the patient currently on ACE/ARB medication? No Does the patient have >5 day gap between last estimated fill dates? CPP to review    Current antihyperglycemic regimen:  None   Patient verbally confirms she is taking the above medications as directed. No  What recent interventions/DTPs have been made to improve glycemic control:  None  Have there been any recent  hospitalizations or ED visits since last visit with CPP? Yes  Patient denies hypoglycemic symptoms  Patient denies hyperglycemic symptoms  How often are you checking your blood sugar? Patient stated she doesn't check blood sugar  What are your blood sugars ranging?  Fasting: None Before meals: None After meals: None Bedtime: None  On insulin? No How many units:  During the week, how often does your blood glucose drop below 70? Patient states   Are you checking your feet daily/regularly? Yes  Adherence Review: Is the patient currently on a STATIN medication? Yes Is the patient currently on ACE/ARB medication? No Does the patient have >5 day gap between last estimated fill dates?  CPP to review  Care Gaps: Last eye exam / Retinopathy Screening? None Last Annual Wellness Visit? 11-28-2020 no show Last Diabetic Foot Exam? None   Star Rating Drugs:  Medication:  Last Fill: Day Supply Atorvastatin 10 mg 05-07-2021 30 (Patient states she has       plenty on hand) [place] Clinical Pharmacist Assistant [phone]

## 2021-06-28 NOTE — Telephone Encounter (Signed)
      Patient was admitted to [facility] 06-20-21 and discharged 06-25-21 (See notes in [place])  Patient was discharged to SNF ([facility] in [place])  Shelle Iron, LPN 57/26/20 3:55 AM

## 2021-06-29 DIAGNOSIS — I700 Atherosclerosis of aorta: Secondary | ICD-10-CM | POA: Diagnosis not present

## 2021-06-29 DIAGNOSIS — N3091 Cystitis, unspecified with hematuria: Secondary | ICD-10-CM | POA: Diagnosis not present

## 2021-06-29 DIAGNOSIS — L89152 Pressure ulcer of sacral region, stage 2: Secondary | ICD-10-CM | POA: Diagnosis not present

## 2021-06-29 DIAGNOSIS — G4733 Obstructive sleep apnea (adult) (pediatric): Secondary | ICD-10-CM | POA: Diagnosis not present

## 2021-06-29 DIAGNOSIS — I2510 Atherosclerotic heart disease of native coronary artery without angina pectoris: Secondary | ICD-10-CM | POA: Diagnosis not present

## 2021-06-29 DIAGNOSIS — R2681 Unsteadiness on feet: Secondary | ICD-10-CM | POA: Diagnosis not present

## 2021-06-29 DIAGNOSIS — E1142 Type 2 diabetes mellitus with diabetic polyneuropathy: Secondary | ICD-10-CM | POA: Diagnosis not present

## 2021-06-29 DIAGNOSIS — Z723 Lack of physical exercise: Secondary | ICD-10-CM | POA: Diagnosis not present

## 2021-07-03 DIAGNOSIS — L89152 Pressure ulcer of sacral region, stage 2: Secondary | ICD-10-CM | POA: Diagnosis not present

## 2021-07-03 DIAGNOSIS — R310 Gross hematuria: Secondary | ICD-10-CM | POA: Diagnosis not present

## 2021-07-08 DIAGNOSIS — Z79899 Other long term (current) drug therapy: Secondary | ICD-10-CM | POA: Diagnosis not present

## 2021-08-30 ENCOUNTER — Telehealth: Payer: Self-pay

## 2021-08-30 NOTE — Chronic Care Management (AMB) (Signed)
    Chronic Care Management Pharmacy Assistant   Name: Satonya Lux  MRN: [id number] DOB: 1941-10-10   Reason for Encounter: Disease State/ Diabetes   Recent office visits:  None  Recent consult visits:  None  Hospital visits:  Medication Reconciliation was completed by comparing discharge summary, patient's EMR and Pharmacy list, and upon discussion with patient.   Admitted to the hospital on 06-20-2021 due to Hemorrhagic cystitis. Discharge date was 06-25-2021. Discharged from [place]?Medications Started at [facility] Discharge:?? Amoxicillin 500 mg every 8 hours   Medication Changes at Hospital Discharge: None   Medications Discontinued at Hospital Discharge: Tramadol    Medications that remain the same after Hospital Discharge:??  -All other medications will remain the same.     Hospital visits:  Admitted to the hospital on 03-23-2021 due to Acute kidney failure. Discharge date was 03-29-2021. Discharged from [facility].  Unable to view encounter.  Medications: Outpatient Encounter Medications as of 08/30/2021  Medication Sig Note   amLODipine (NORVASC) 2.5 MG tablet TAKE ONE TABLET BY MOUTH EVERY DAY    aspirin EC 81 MG tablet Take 81 mg by mouth daily. Swallow whole.    atorvastatin (LIPITOR) 10 MG tablet Take 10 mg by mouth daily.    Biotin 1000 MCG tablet Take 1,000 mcg by mouth daily.    Blood Glucose Monitoring Suppl (ONE TOUCH ULTRA MINI) w/Device KIT Use daily to check blood sugar levels. E11.42    Cholecalciferol (CVS VITAMIN D3) 250 MCG (10000 UT) CAPS Take 2 capsules by mouth daily. For 30 days for COVID    cholecalciferol (VITAMIN D3) 25 MCG (1000 UNIT) tablet Take 2,000 Units by mouth daily. 06/21/2021: HOLD    glucose blood (ONETOUCH ULTRA) test strip CHECK BLOOD SUGAR EVERY DAY    guaiFENesin (MUCINEX) 600 MG 12 hr tablet Take 600 mg by mouth 2 (two) times daily.    levothyroxine (SYNTHROID) 75 MCG tablet Take 75 mcg by  mouth daily.    Milnacipran HCl (SAVELLA) 100 MG TABS tablet Take 1 tablet (100 mg total) by mouth 2 (two) times daily.    Multiple Vitamin (MULTIVITAMIN) tablet Take 1 tablet by mouth daily.    ondansetron (ZOFRAN) 4 MG tablet Take 4 mg by mouth every 6 (six) hours as needed for nausea or vomiting.    pantoprazole (PROTONIX) 40 MG tablet Take 1 tablet (40 mg total) by mouth daily.    pregabalin (LYRICA) 300 MG capsule TAKE ONE CAPSULE BY MOUTH 2 TIMES A DAY    vitamin B-12 (CYANOCOBALAMIN) 1000 MCG tablet Take 1,000 mcg by mouth daily.    vitamin C (ASCORBIC ACID) 500 MG tablet Take 500 mg by mouth 2 (two) times daily.    No facility-administered encounter medications on file as of 08/30/2021.   Recent Relevant Labs: Lab Results  Component Value Date/Time   HGBA1C 5.5 12/24/2020 10:08 AM   HGBA1C 6.7 (H) 09/17/2020 10:14 AM   MICROALBUR 80 09/17/2020 10:18 AM    Kidney Function Lab Results  Component Value Date/Time   CREATININE 0.90 06/24/2021 04:13 AM   CREATININE 0.94 06/23/2021 05:11 AM   GFRNONAA >60 06/24/2021 04:13 AM   GFRAA 66 12/24/2020 10:08 AM     08-30-2021: 1st attempt VM full 09-02-2021: 2nd attempt VM full 09-03-2021: 3rd attempt VM full   Star Rating Drugs: Atorvastatin 10 mg- Last filled 08-15-2021 30 DS  [place] Clinical Pharmacist Assistant [phone]

## 2021-09-27 ENCOUNTER — Emergency Department (HOSPITAL_COMMUNITY): Payer: Medicare Other

## 2021-09-27 ENCOUNTER — Emergency Department (HOSPITAL_COMMUNITY)
Admission: EM | Admit: 2021-09-27 | Discharge: 2021-09-28 | Disposition: A | Discharge status: Home / Self Care | Payer: Medicare Other | Attending: Emergency Medicine | Admitting: Emergency Medicine

## 2021-09-27 ENCOUNTER — Encounter (HOSPITAL_COMMUNITY): Payer: Self-pay | Admitting: Emergency Medicine

## 2021-09-27 ENCOUNTER — Telehealth: Payer: Self-pay

## 2021-09-27 ENCOUNTER — Other Ambulatory Visit: Payer: Self-pay

## 2021-09-27 DIAGNOSIS — M79661 Pain in right lower leg: Secondary | ICD-10-CM | POA: Diagnosis not present

## 2021-09-27 DIAGNOSIS — E1142 Type 2 diabetes mellitus with diabetic polyneuropathy: Secondary | ICD-10-CM | POA: Diagnosis not present

## 2021-09-27 DIAGNOSIS — Z79899 Other long term (current) drug therapy: Secondary | ICD-10-CM | POA: Insufficient documentation

## 2021-09-27 DIAGNOSIS — I10 Essential (primary) hypertension: Secondary | ICD-10-CM | POA: Insufficient documentation

## 2021-09-27 DIAGNOSIS — Z23 Encounter for immunization: Secondary | ICD-10-CM | POA: Insufficient documentation

## 2021-09-27 DIAGNOSIS — S022XXA Fracture of nasal bones, initial encounter for closed fracture: Secondary | ICD-10-CM | POA: Diagnosis not present

## 2021-09-27 DIAGNOSIS — E039 Hypothyroidism, unspecified: Secondary | ICD-10-CM | POA: Insufficient documentation

## 2021-09-27 DIAGNOSIS — Z96619 Presence of unspecified artificial shoulder joint: Secondary | ICD-10-CM | POA: Diagnosis not present

## 2021-09-27 DIAGNOSIS — W19XXXA Unspecified fall, initial encounter: Secondary | ICD-10-CM

## 2021-09-27 DIAGNOSIS — Z20822 Contact with and (suspected) exposure to covid-19: Secondary | ICD-10-CM | POA: Diagnosis not present

## 2021-09-27 DIAGNOSIS — Z7982 Long term (current) use of aspirin: Secondary | ICD-10-CM | POA: Insufficient documentation

## 2021-09-27 DIAGNOSIS — S0992XA Unspecified injury of nose, initial encounter: Secondary | ICD-10-CM | POA: Diagnosis present

## 2021-09-27 DIAGNOSIS — Y900 Blood alcohol level of less than 20 mg/100 ml: Secondary | ICD-10-CM | POA: Insufficient documentation

## 2021-09-27 DIAGNOSIS — W1830XA Fall on same level, unspecified, initial encounter: Secondary | ICD-10-CM | POA: Insufficient documentation

## 2021-09-27 DIAGNOSIS — S0181XA Laceration without foreign body of other part of head, initial encounter: Secondary | ICD-10-CM

## 2021-09-27 LAB — I-STAT CHEM 8, ED
BUN: 29 mg/dL — ABNORMAL HIGH (ref 8–23)
Calcium, Ion: 1.31 mmol/L (ref 1.15–1.40)
Chloride: 105 mmol/L (ref 98–111)
Creatinine, Ser: 1.3 mg/dL — ABNORMAL HIGH (ref 0.44–1.00)
Glucose, Bld: 127 mg/dL — ABNORMAL HIGH (ref 70–99)
HCT: 33 % — ABNORMAL LOW (ref 36.0–46.0)
Hemoglobin: 11.2 g/dL — ABNORMAL LOW (ref 12.0–15.0)
Potassium: 4.1 mmol/L (ref 3.5–5.1)
Sodium: 138 mmol/L (ref 135–145)
TCO2: 24 mmol/L (ref 22–32)

## 2021-09-27 LAB — CBC WITH DIFFERENTIAL/PLATELET
Abs Immature Granulocytes: 0.03 10*3/uL (ref 0.00–0.07)
Basophils Absolute: 0 10*3/uL (ref 0.0–0.1)
Basophils Relative: 0 %
Eosinophils Absolute: 0.4 10*3/uL (ref 0.0–0.5)
Eosinophils Relative: 4 %
HCT: 34.6 % — ABNORMAL LOW (ref 36.0–46.0)
Hemoglobin: 10.9 g/dL — ABNORMAL LOW (ref 12.0–15.0)
Immature Granulocytes: 0 %
Lymphocytes Relative: 21 %
Lymphs Abs: 2.1 10*3/uL (ref 0.7–4.0)
MCH: 29.2 pg (ref 26.0–34.0)
MCHC: 31.5 g/dL (ref 30.0–36.0)
MCV: 92.8 fL (ref 80.0–100.0)
Monocytes Absolute: 0.8 10*3/uL (ref 0.1–1.0)
Monocytes Relative: 8 %
Neutro Abs: 6.6 10*3/uL (ref 1.7–7.7)
Neutrophils Relative %: 67 %
Platelets: 325 10*3/uL (ref 150–400)
RBC: 3.73 MIL/uL — ABNORMAL LOW (ref 3.87–5.11)
RDW: 14.7 % (ref 11.5–15.5)
WBC: 10 10*3/uL (ref 4.0–10.5)
nRBC: 0 % (ref 0.0–0.2)

## 2021-09-27 LAB — PROTIME-INR
INR: 1.1 (ref 0.8–1.2)
Prothrombin Time: 14.2 seconds (ref 11.4–15.2)

## 2021-09-27 LAB — COMPREHENSIVE METABOLIC PANEL
ALT: 23 U/L (ref 0–44)
AST: 31 U/L (ref 15–41)
Albumin: 2.6 g/dL — ABNORMAL LOW (ref 3.5–5.0)
Alkaline Phosphatase: 79 U/L (ref 38–126)
Anion gap: 6 (ref 5–15)
BUN: 22 mg/dL (ref 8–23)
CO2: 23 mmol/L (ref 22–32)
Calcium: 10 mg/dL (ref 8.9–10.3)
Chloride: 106 mmol/L (ref 98–111)
Creatinine, Ser: 1.28 mg/dL — ABNORMAL HIGH (ref 0.44–1.00)
GFR, Estimated: 42 mL/min — ABNORMAL LOW (ref >60)
Glucose, Bld: 129 mg/dL — ABNORMAL HIGH (ref 70–99)
Potassium: 4 mmol/L (ref 3.5–5.1)
Sodium: 135 mmol/L (ref 135–145)
Total Bilirubin: 0.5 mg/dL (ref 0.3–1.2)
Total Protein: 7.5 g/dL (ref 6.5–8.1)

## 2021-09-27 LAB — ETHANOL: Alcohol, Ethyl (B): <10 mg/dL (ref <10)

## 2021-09-27 LAB — SAMPLE TO BLOOD BANK

## 2021-09-27 LAB — RESP PANEL BY RT-PCR (FLU A&B, COVID) ARPGX2
Influenza A by PCR: NEGATIVE
Influenza B by PCR: NEGATIVE
SARS Coronavirus 2 by RT PCR: NEGATIVE

## 2021-09-27 LAB — LACTIC ACID, PLASMA: Lactic Acid, Venous: 1.4 mmol/L (ref 0.5–1.9)

## 2021-09-27 MED ORDER — TETANUS-DIPHTH-ACELL PERTUSSIS 5-2.5-18.5 LF-MCG/0.5 IM SUSY
0.5000 mL | PREFILLED_SYRINGE | Freq: Once | INTRAMUSCULAR | Status: AC
  Administered 2021-09-27: 0.5 mL via INTRAMUSCULAR
  Filled 2021-09-27: qty 0.5

## 2021-09-27 MED ORDER — CEPHALEXIN 500 MG PO CAPS: 500.0000 mg | ORAL_CAPSULE | Freq: Four times a day (QID) | ORAL | 0 refills | Status: AC

## 2021-09-27 MED ORDER — FENTANYL CITRATE PF 50 MCG/ML IJ SOSY: 50.0000 ug | PREFILLED_SYRINGE | Freq: Once | INTRAMUSCULAR | Status: DC

## 2021-09-27 MED ORDER — LACTATED RINGERS IV BOLUS
500.0000 mL | Freq: Once | INTRAVENOUS | Status: AC
  Administered 2021-09-27: 500 mL via INTRAVENOUS

## 2021-09-27 MED ORDER — FENTANYL CITRATE PF 50 MCG/ML IJ SOSY
50.0000 ug | PREFILLED_SYRINGE | Freq: Once | INTRAMUSCULAR | Status: AC
  Administered 2021-09-27: 50 ug via INTRAVENOUS
  Filled 2021-09-27: qty 1

## 2021-09-27 MED ORDER — LIDOCAINE-EPINEPHRINE 1 %-1:100000 IJ SOLN
10.0000 mL | Freq: Once | INTRAMUSCULAR | Status: AC
  Administered 2021-09-27: 10 mL
  Filled 2021-09-27: qty 1

## 2021-09-27 NOTE — ED Provider Notes (Signed)
[facility] EMERGENCY DEPARTMENT Provider Note   CSN: [id number] Arrival date & time: 09/27/21  1645     History Chief Complaint  Patient presents with   [name]    Cindy Gordon is a 80 y.o. female.   Fall Pertinent negatives include no chest pain, no abdominal pain, no headaches and no shortness of breath.  80 year old female with PMH significant for DM, HLD, HTN, fibromyalgia, and others as below who presents to the ED via EMS from her nursing home s/p fall.  Patient reports that her wheelchair came out from underneath her and she fell forward onto her face at approximately 2:30 PM today.  She denies any preceding symptoms before the fall, such as chest pain, shortness of breath, lightheadedness, palpitations, or any other complaints.  She denies any other recent illnesses or injuries.  She does not use a blood thinner.  She denies LOC, AMS, neck pain, or any other concerns after the fall.  She is not ambulatory at baseline.  She reports significant bleeding from a left frontal head wound.  On arrival, she is complaining of left forehead pain and bleeding, and RLE pain.  Discussed case with patient's son via phone, who reports that his mother's neurologic baseline includes occasional confusion about time and place, and occasionally speaks slowly or answers questions with some confusion.  He also endorses that the patient does not use a blood thinner.  Past Medical History:  Diagnosis Date   Atrophy of thyroid (acquired)    Carpal tunnel syndrome    Chronic back pain    Diabetes mellitus without complication (HCC)    Fibromyalgia    Gastro-esophageal reflux disease without esophagitis    Hyperlipidemia    Hypertension    Irritable bowel syndrome    Localized edema    Localized swelling, mass and lump, neck    Neuromuscular dysfunction of bladder, unspecified    Obstructive sleep apnea (adult) (pediatric)    Other obesity    Other specified hypothyroidism     Sciatica, right side    Urge incontinence    Vitamin B 12 deficiency    Vitamin D deficiency     Patient Active Problem List   Diagnosis Date Noted   Hematuria 06/21/2021   ABLA (acute blood loss anemia) 06/21/2021   Pressure injury of skin 06/21/2021   Urge incontinence 07/23/2020   Mixed hyperlipidemia 03/15/2020   Controlled type 2 diabetes mellitus with diabetic polyneuropathy, without long-term current use of insulin ([place]) 03/15/2020   Atrophy of thyroid 03/15/2020   Fibromyalgia 03/15/2020   Gastroesophageal reflux disease without esophagitis 03/15/2020   OSA (obstructive sleep apnea) 03/15/2020   Class 1 obesity due to excess calories with serious comorbidity and body mass index (BMI) of 32.0 to 32.9 in adult 03/15/2020   Essential hypertension, benign 01/23/2020   Acute cystitis without hematuria 01/23/2020   Vertigo 01/23/2020   Fall on same level 01/23/2020   Abrasion of right arm 01/23/2020    Past Surgical History:  Procedure Laterality Date   APPENDECTOMY     BLADDER SUSPENSION     BREAST BIOPSY     right breast: benign   carpel tunnel repair Left 08/2014   CATARACT EXTRACTION  2019   right    CHOLECYSTECTOMY OPEN  1980   acute panceatitis:requiring removal of stones from pancreatitic duct   dobutamine nuclear stress test     12/2010   HYSTERECTOMY ABDOMINAL WITH SALPINGO-OOPHORECTOMY     LUMBAR LAMINECTOMY  RECTOCELE REPAIR  02/2011   REVERSE TOTAL SHOULDER ARTHROPLASTY  07/2017   TOTAL SHOULDER REPLACEMENT  2002   secondary to staph infection   VEIN LIGATION AND STRIPPING       OB History   No obstetric history on file.     Family History  Problem Relation Age of Onset   Alzheimer's disease Mother    CVA Mother    Alzheimer's disease Sister    Diabetes Sister    Hypertension Sister    Hypothyroidism Sister    Congenital heart disease Sister    Prostate cancer Son    Coronary artery disease Daughter    Peptic Ulcer Other    Diabetes  type II Other    Cystic fibrosis Grandson     Social History   Tobacco Use   Smoking status: Never   Smokeless tobacco: Never  Vaping Use   Vaping Use: Never used  Substance Use Topics   Alcohol use: Never   Drug use: Never    Home Medications Prior to Admission medications   Medication Sig Start Date End Date Taking? Authorizing Provider  cephALEXin (KEFLEX) 500 MG capsule Take 1 capsule (500 mg total) by mouth 4 (four) times daily for 7 days. 09/27/21 10/04/21 Yes , Khy Pitre, DO  amLODipine (NORVASC) 2.5 MG tablet TAKE ONE TABLET BY MOUTH EVERY DAY 09/03/20   Cox, Elnita Maxwell, MD  aspirin EC 81 MG tablet Take 81 mg by mouth daily. Swallow whole.    [provider]  atorvastatin (LIPITOR) 10 MG tablet Take 10 mg by mouth daily. 05/07/21   [provider]  Biotin 1000 MCG tablet Take 1,000 mcg by mouth daily.    [provider]  Blood Glucose Monitoring Suppl (ONE TOUCH ULTRA MINI) w/Device KIT Use daily to check blood sugar levels. E11.42 06/20/20   CoxElnita Maxwell, MD  Cholecalciferol ([employer] VITAMIN D3) 250 MCG (10000 UT) CAPS Take 2 capsules by mouth daily. For 30 days for COVID    [provider]  cholecalciferol (VITAMIN D3) 25 MCG (1000 UNIT) tablet Take 2,000 Units by mouth daily.    [provider]  glucose blood (ONETOUCH ULTRA) test strip CHECK BLOOD SUGAR EVERY DAY 12/17/20   Cox, Elnita Maxwell, MD  guaiFENesin (MUCINEX) 600 MG 12 hr tablet Take 600 mg by mouth 2 (two) times daily.    [provider]  levothyroxine (SYNTHROID) 75 MCG tablet Take 75 mcg by mouth daily. 06/12/21   [provider]  Milnacipran HCl (SAVELLA) 100 MG TABS tablet Take 1 tablet (100 mg total) by mouth 2 (two) times daily. 07/23/20   CoxElnita Maxwell, MD  Multiple Vitamin (MULTIVITAMIN) tablet Take 1 tablet by mouth daily.    [provider]  ondansetron (ZOFRAN) 4 MG tablet Take 4 mg by mouth every 6 (six) hours as needed for nausea or vomiting.     [provider]  pantoprazole (PROTONIX) 40 MG tablet Take 1 tablet (40 mg total) by mouth daily. 12/17/20   Cox, Elnita Maxwell, MD  pregabalin (LYRICA) 300 MG capsule TAKE ONE CAPSULE BY MOUTH 2 TIMES A DAY 09/20/20   Cox, Kirsten, MD  vitamin B-12 (CYANOCOBALAMIN) 1000 MCG tablet Take 1,000 mcg by mouth daily.    [provider]  vitamin C (ASCORBIC ACID) 500 MG tablet Take 500 mg by mouth 2 (two) times daily.    [provider]    Allergies    Contrast media [iodinated diagnostic agents], Hydrocodone, Codeine, Crestor [rosuvastatin], Lisinopril, Naproxen, Niaspan [niacin],  and Penicillins  Review of Systems   Review of Systems  Constitutional:  Negative for fever.  HENT:  Negative for nosebleeds, sore throat and trouble swallowing.   Eyes:  Positive for pain and redness. Negative for photophobia and visual disturbance.  Respiratory:  Negative for cough and shortness of breath.   Cardiovascular:  Negative for chest pain, palpitations and leg swelling.  Gastrointestinal:  Negative for abdominal pain, nausea and vomiting.  Musculoskeletal:  Positive for gait problem. Negative for arthralgias, back pain, neck pain and neck stiffness.  Skin:  Positive for wound. Negative for color change, pallor and rash.  Allergic/Immunologic: Negative for immunocompromised state.  Neurological:  Negative for dizziness, tremors, seizures, syncope, speech difficulty, weakness, light-headedness, numbness and headaches.  Hematological:  Does not bruise/bleed easily.  Psychiatric/Behavioral:  Positive for confusion. The patient is not nervous/anxious.   All other systems reviewed and are negative.  Physical Exam Updated Vital Signs BP 137/61   Pulse 73   Temp 97.7 F (36.5 C) (Oral)   Resp 17   Ht 5' (1.524 m)   Wt 65.8 kg   SpO2 100%   BMI 28.32 kg/m   Physical Exam Vitals and nursing note reviewed.  Constitutional:      General: She is not in acute distress.    Appearance:  Normal appearance. She is well-developed and normal weight. She is not ill-appearing, toxic-appearing or diaphoretic.     Interventions: Cervical collar in place.  HENT:     Head: Normocephalic. Abrasion, contusion, left periorbital erythema and laceration present. No raccoon eyes, Battle's sign or right periorbital erythema.     Jaw: There is normal jaw occlusion.     Comments: Dried blood around lips and oropharynx.  5 cm laceration with visible subcutaneous fat above the left eyebrow, small venous oozing with blood clots. No obvious ocular injury. Small abrasion to left posterior scalp.  Small hemostatic abrasion and skin tear to bridge of nose.    Right Ear: External ear normal.     Left Ear: External ear normal.     Nose: Nose normal.     Right Nostril: No epistaxis or septal hematoma.     Left Nostril: No epistaxis or septal hematoma.     Mouth/Throat:     Lips: Pink.     Mouth: Mucous membranes are moist.     Pharynx: Oropharynx is clear. Uvula midline.     Comments: Dried blood in oropharynx, no obvious lacerations or loose teeth Eyes:     General: Vision grossly intact. No scleral icterus.    Extraocular Movements: Extraocular movements intact.     Conjunctiva/sclera: Conjunctivae normal.     Pupils: Pupils are equal, round, and reactive to light.     Comments: Pupils 3 mm  Neck:     Trachea: Trachea and phonation normal.  Cardiovascular:     Rate and Rhythm: Normal rate and regular rhythm.     Pulses: Normal pulses.     Heart sounds: Normal heart sounds. No murmur heard. Pulmonary:     Effort: Pulmonary effort is normal. No respiratory distress.     Breath sounds: Normal breath sounds and air entry.  Abdominal:     General: There is no distension.     Palpations: Abdomen is soft.     Tenderness: There is no abdominal tenderness. There is no guarding or rebound.  Musculoskeletal:        General: Normal range of motion.     Cervical back: Normal, full  passive range of  motion without pain, normal range of motion and neck supple. No rigidity, tenderness or bony tenderness. No spinous process tenderness or muscular tenderness.     Thoracic back: Normal. No tenderness or bony tenderness.     Lumbar back: Normal. No tenderness or bony tenderness.     Right lower leg: Deformity, tenderness and bony tenderness present. No edema.     Left lower leg: Normal. No edema.     Comments: Deformity to distal RLE and two lower extremity hematomas. Unclear if chronic vs acute  Lymphadenopathy:     Cervical: No cervical adenopathy.  Skin:    General: Skin is warm and dry.     Capillary Refill: Capillary refill takes less than 2 seconds.  Neurological:     General: No focal deficit present.     Mental Status: She is alert. Mental status is at baseline. She is confused.     GCS: GCS eye subscore is 4. GCS verbal subscore is 4. GCS motor subscore is 6.     Cranial Nerves: Cranial nerves 2-12 are intact.     Sensory: Sensation is intact.     Motor: Motor function is intact.     Comments: States it is 2020 and is slow to answer questions, this is patient's baseline per discussion with son.  No focal deficits.  Nonambulatory at baseline.  Psychiatric:        Mood and Affect: Mood normal.        Behavior: Behavior normal. Behavior is cooperative.    ED Results / Procedures / Treatments   Labs (all labs ordered are listed, but only abnormal results are displayed) Labs Reviewed  COMPREHENSIVE METABOLIC PANEL - Abnormal; Notable for the following components:      Result Value   Glucose, Bld 129 (*)    Creatinine, Ser 1.28 (*)    Albumin 2.6 (*)    GFR, Estimated 42 (*)    All other components within normal limits  CBC WITH DIFFERENTIAL/PLATELET - Abnormal; Notable for the following components:   RBC 3.73 (*)    Hemoglobin 10.9 (*)    HCT 34.6 (*)    All other components within normal limits  I-STAT CHEM 8, ED - Abnormal; Notable for the following components:   BUN 29  (*)    Creatinine, Ser 1.30 (*)    Glucose, Bld 127 (*)    Hemoglobin 11.2 (*)    HCT 33.0 (*)    All other components within normal limits  RESP PANEL BY RT-PCR (FLU A&B, COVID) ARPGX2  ETHANOL  LACTIC ACID, PLASMA  PROTIME-INR  URINALYSIS, ROUTINE W REFLEX MICROSCOPIC  SAMPLE TO BLOOD BANK    EKG None  Radiology CT HEAD WO CONTRAST  Result Date: 09/27/2021 CLINICAL DATA:  Facial trauma. Status post ground level fall. Left forehead and eye injury. EXAM: CT HEAD WITHOUT CONTRAST CT MAXILLOFACIAL WITHOUT CONTRAST CT CERVICAL SPINE WITHOUT CONTRAST TECHNIQUE: Multidetector CT imaging of the head, cervical spine, and maxillofacial structures were performed using the standard protocol without intravenous contrast. Multiplanar CT image reconstructions of the cervical spine and maxillofacial structures were also generated. COMPARISON:  CT head 06/20/2021 FINDINGS: CT HEAD FINDINGS BRAIN: BRAIN Cerebral ventricle sizes are concordant with the degree of cerebral volume loss. Patchy and confluent areas of decreased attenuation are noted throughout the deep and periventricular white matter of the cerebral hemispheres bilaterally, compatible with chronic microvascular ischemic disease. No evidence of large-territorial acute infarction. No parenchymal hemorrhage. No mass lesion.  No extra-axial collection. Similar-appearing falx cerebral calcifications. No mass effect or midline shift. No hydrocephalus. Basilar cisterns are patent. Vascular: No hyperdense vessel. Skull: No acute fracture or focal lesion. Other: None. CT MAXILLOFACIAL FINDINGS Osseous: Acute minimally displaced left nasal bone fracture. No fracture or mandibular dislocation. No destructive process. Sinuses/Orbits: Left sphenoid sinus mucosal thickening. Otherwise paranasal sinuses and mastoid air cells are clear. The orbits are unremarkable. Soft tissues: Left periorbital subcutaneus soft tissue edema and emphysema with a small hematoma  formation. CT CERVICAL SPINE FINDINGS Alignment: Normal. Skull base and vertebrae: Multilevel degenerative changes of the spine most prominent at the C5-C6 level and C6-C7 levels. No acute fracture. No aggressive appearing focal osseous lesion or focal pathologic process. Soft tissues and spinal canal: No prevertebral fluid or swelling. No visible canal hematoma. Upper chest: Unremarkable. Other: Carotid artery calcifications within the neck. Partially visualized total right shoulder arthroplasty. IMPRESSION: 1. No acute intracranial abnormality. 2. Acute minimally displaced left nasal bone fracture. 3. No acute displaced fracture or traumatic listhesis of the cervical spine. Electronically Signed   By: Iven Finn M.D.   On: 09/27/2021 19:05   CT CERVICAL SPINE WO CONTRAST  Result Date: 09/27/2021 CLINICAL DATA:  Facial trauma. Status post ground level fall. Left forehead and eye injury. EXAM: CT HEAD WITHOUT CONTRAST CT MAXILLOFACIAL WITHOUT CONTRAST CT CERVICAL SPINE WITHOUT CONTRAST TECHNIQUE: Multidetector CT imaging of the head, cervical spine, and maxillofacial structures were performed using the standard protocol without intravenous contrast. Multiplanar CT image reconstructions of the cervical spine and maxillofacial structures were also generated. COMPARISON:  CT head 06/20/2021 FINDINGS: CT HEAD FINDINGS BRAIN: BRAIN Cerebral ventricle sizes are concordant with the degree of cerebral volume loss. Patchy and confluent areas of decreased attenuation are noted throughout the deep and periventricular white matter of the cerebral hemispheres bilaterally, compatible with chronic microvascular ischemic disease. No evidence of large-territorial acute infarction. No parenchymal hemorrhage. No mass lesion. No extra-axial collection. Similar-appearing falx cerebral calcifications. No mass effect or midline shift. No hydrocephalus. Basilar cisterns are patent. Vascular: No hyperdense vessel. Skull: No acute  fracture or focal lesion. Other: None. CT MAXILLOFACIAL FINDINGS Osseous: Acute minimally displaced left nasal bone fracture. No fracture or mandibular dislocation. No destructive process. Sinuses/Orbits: Left sphenoid sinus mucosal thickening. Otherwise paranasal sinuses and mastoid air cells are clear. The orbits are unremarkable. Soft tissues: Left periorbital subcutaneus soft tissue edema and emphysema with a small hematoma formation. CT CERVICAL SPINE FINDINGS Alignment: Normal. Skull base and vertebrae: Multilevel degenerative changes of the spine most prominent at the C5-C6 level and C6-C7 levels. No acute fracture. No aggressive appearing focal osseous lesion or focal pathologic process. Soft tissues and spinal canal: No prevertebral fluid or swelling. No visible canal hematoma. Upper chest: Unremarkable. Other: Carotid artery calcifications within the neck. Partially visualized total right shoulder arthroplasty. IMPRESSION: 1. No acute intracranial abnormality. 2. Acute minimally displaced left nasal bone fracture. 3. No acute displaced fracture or traumatic listhesis of the cervical spine. Electronically Signed   By: Iven Finn M.D.   On: 09/27/2021 19:05   DG Pelvis Portable  Result Date: 09/27/2021 CLINICAL DATA:  Fall. EXAM: PORTABLE PELVIS 1-2 VIEWS COMPARISON:  CT of the pelvis dated 06/21/2021. FINDINGS: There is no acute fracture or dislocation. Mild bilateral hip arthritic changes. There is degenerative changes of the lower lumbar spine. The soft tissues are unremarkable. IMPRESSION: No acute fracture or dislocation. Electronically Signed   By: Anner Crete M.D.   On: 09/27/2021 18:29  DG Chest Port 1 View  Result Date: 09/27/2021 CLINICAL DATA:  Fall chronic lacunar. EXAM: PORTABLE CHEST 1 VIEW COMPARISON:  Chest radiograph dated 06/20/2021. FINDINGS: Shallow inspiration. Left lung base atelectasis. Pneumonia is not excluded. Clinical correlation is recommended no pleural  effusion pneumothorax. The cardiac silhouette is within limits. Atherosclerotic calcification of the aorta. No acute osseous pathology. Bilateral shoulder arthroplasties. IMPRESSION: Left lung base atelectasis versus infiltrate. Electronically Signed   By: Anner Crete M.D.   On: 09/27/2021 18:32   DG Tibia/Fibula Right Port  Result Date: 09/27/2021 CLINICAL DATA:  Fall and trauma to the right lower extremity. EXAM: PORTABLE RIGHT TIBIA AND FIBULA - 2 VIEW; RIGHT FEMUR PORTABLE 1 VIEW COMPARISON:  None. FINDINGS: There is no acute fracture or dislocation. The bones are osteopenic. There is degenerative changes of the right knee. The soft tissues are unremarkable. IMPRESSION: No acute fracture or dislocation. Electronically Signed   By: Anner Crete M.D.   On: 09/27/2021 18:31   DG FEMUR PORT, 1V RIGHT  Result Date: 09/27/2021 CLINICAL DATA:  Fall and trauma to the right lower extremity. EXAM: PORTABLE RIGHT TIBIA AND FIBULA - 2 VIEW; RIGHT FEMUR PORTABLE 1 VIEW COMPARISON:  None. FINDINGS: There is no acute fracture or dislocation. The bones are osteopenic. There is degenerative changes of the right knee. The soft tissues are unremarkable. IMPRESSION: No acute fracture or dislocation. Electronically Signed   By: Anner Crete M.D.   On: 09/27/2021 18:31   CT MAXILLOFACIAL WO CONTRAST  Result Date: 09/27/2021 CLINICAL DATA:  Facial trauma. Status post ground level fall. Left forehead and eye injury. EXAM: CT HEAD WITHOUT CONTRAST CT MAXILLOFACIAL WITHOUT CONTRAST CT CERVICAL SPINE WITHOUT CONTRAST TECHNIQUE: Multidetector CT imaging of the head, cervical spine, and maxillofacial structures were performed using the standard protocol without intravenous contrast. Multiplanar CT image reconstructions of the cervical spine and maxillofacial structures were also generated. COMPARISON:  CT head 06/20/2021 FINDINGS: CT HEAD FINDINGS BRAIN: BRAIN Cerebral ventricle sizes are concordant with the  degree of cerebral volume loss. Patchy and confluent areas of decreased attenuation are noted throughout the deep and periventricular white matter of the cerebral hemispheres bilaterally, compatible with chronic microvascular ischemic disease. No evidence of large-territorial acute infarction. No parenchymal hemorrhage. No mass lesion. No extra-axial collection. Similar-appearing falx cerebral calcifications. No mass effect or midline shift. No hydrocephalus. Basilar cisterns are patent. Vascular: No hyperdense vessel. Skull: No acute fracture or focal lesion. Other: None. CT MAXILLOFACIAL FINDINGS Osseous: Acute minimally displaced left nasal bone fracture. No fracture or mandibular dislocation. No destructive process. Sinuses/Orbits: Left sphenoid sinus mucosal thickening. Otherwise paranasal sinuses and mastoid air cells are clear. The orbits are unremarkable. Soft tissues: Left periorbital subcutaneus soft tissue edema and emphysema with a small hematoma formation. CT CERVICAL SPINE FINDINGS Alignment: Normal. Skull base and vertebrae: Multilevel degenerative changes of the spine most prominent at the C5-C6 level and C6-C7 levels. No acute fracture. No aggressive appearing focal osseous lesion or focal pathologic process. Soft tissues and spinal canal: No prevertebral fluid or swelling. No visible canal hematoma. Upper chest: Unremarkable. Other: Carotid artery calcifications within the neck. Partially visualized total right shoulder arthroplasty. IMPRESSION: 1. No acute intracranial abnormality. 2. Acute minimally displaced left nasal bone fracture. 3. No acute displaced fracture or traumatic listhesis of the cervical spine. Electronically Signed   By: Iven Finn M.D.   On: 09/27/2021 19:05    Procedures .Marland KitchenLaceration Repair  Date/Time: 09/28/2021 10:00 PM Performed by: Cherly Hensen, DO Authorized by:  Carmin Muskrat, MD   Consent:    Consent obtained:  Verbal   Consent given by:  Patient    Risks, benefits, and alternatives were discussed: yes     Risks discussed:  Infection, pain, retained foreign body, need for additional repair, poor cosmetic result, tendon damage, vascular damage, poor wound healing and nerve damage   Alternatives discussed:  No treatment, delayed treatment and observation Universal protocol:    Procedure explained and questions answered to patient or proxy's satisfaction: yes     Relevant documents present and verified: yes     Test results available: yes     Imaging studies available: yes     Required blood products, implants, devices, and special equipment available: yes     Immediately prior to procedure, a time out was called: yes     Patient identity confirmed:  Verbally with patient and arm band Anesthesia:    Anesthesia method:  Local infiltration   Local anesthetic:  Lidocaine 1% WITH epi Laceration details:    Location:  Face   Face location:  Forehead   Length (cm):  5   Depth (mm):  10 Pre-procedure details:    Preparation:  Patient was prepped and draped in usual sterile fashion and imaging obtained to evaluate for foreign bodies Exploration:    Hemostasis achieved with:  Direct pressure   Imaging outcome: foreign body not noted     Wound exploration: wound explored through full range of motion and entire depth of wound visualized     Contaminated: no   Treatment:    Area cleansed with:  Povidone-iodine   Amount of cleaning:  Extensive   Irrigation solution:  Sterile saline   Irrigation volume:  500 cc   Irrigation method:  Syringe   Visualized foreign bodies/material removed: no     Debridement:  None   Undermining:  Minimal   Scar revision: no   Skin repair:    Repair method:  Sutures   Suture size:  5-0   Suture material:  Nylon   Suture technique:  Simple interrupted   Number of sutures:  12 Approximation:    Approximation:  Close Repair type:    Repair type:  Complex Post-procedure details:    Dressing:  Open (no  dressing)   Procedure completion:  Tolerated   Medications Ordered in ED Medications  Tdap (BOOSTRIX) injection 0.5 mL (0.5 mLs Intramuscular Given 09/27/21 1745)  fentaNYL (SUBLIMAZE) injection 50 mcg (50 mcg Intravenous Given 09/27/21 1744)  lactated ringers bolus 500 mL (500 mLs Intravenous New Bag/Given 09/27/21 1908)  lidocaine-EPINEPHrine (XYLOCAINE W/EPI) 1 %-1:100000 (with pres) injection 10 mL (10 mLs Infiltration Given 09/27/21 2038)    ED Course  I have reviewed the triage vital signs and the nursing notes.  Pertinent labs & imaging results that were available during my care of the patient were reviewed by me and considered in my medical decision making (see chart for details).    MDM Rules/Calculators/A&P                          Cindy Gordon is a 80 y.o. female who presented via EMS as an unleveled trauma s/p fall.  Upon arrival of the patient, EMS and patient provided pertinent history and exam findings. ABCs intact as exam above during primary exam. Once PIV was established, the secondary exam was performed. Pertinent exam findings include RLE deformity and facial lac. eFAST exam was not performed. Sent for  imaging with results as above.  Pertinent labs: COVID/flu negative.  Mild AKI.  Coags WNL.  Normocytic anemia near prior baseline.  Labs otherwise unremarkable  Medications: Medications  Tdap (BOOSTRIX) injection 0.5 mL (0.5 mLs Intramuscular Given 09/27/21 1745)  fentaNYL (SUBLIMAZE) injection 50 mcg (50 mcg Intravenous Given 09/27/21 1744)  lactated ringers bolus 500 mL (500 mLs Intravenous New Bag/Given 09/27/21 1908)  lidocaine-EPINEPHrine (XYLOCAINE W/EPI) 1 %-1:100000 (with pres) injection 10 mL (10 mLs Infiltration Given 09/27/21 2038)    Fall appears mechanical based on history, low suspicion for other etiology.  Significant findings include forehead laceration and nasal fracture. Based on the patient's clinical picture, trauma surgery was not consulted.  On re-evaluation, patient is resting comfortably in no acute distress.  Remained hemodynamically stable and at neurologic baseline while in ED.  Wound extensively irrigated and repaired at the bedside successfully as above with appropriate hemostasis and closure.  Exam after repair reveals patient able to open both eyes actively with normal EOMs and visual acuity intact.  No septal hematoma visualized.  Deformity to RLE appears to be chronic based on imaging, discussed with son that patient has had multiple chronic bony and cartilaginous issues with this leg.  C-collar cleared after negative C-spine imaging.  Patient tolerated palpation of the spinous processes without significant discomfort, and had normal range of motion without elicitation of neurologic symptoms or increased pain.  Discharged home in stable condition.  Rx Keflex outpatient, given nasal fracture with small skin tear for coverage.  PCP follow-up advised for suture removal and reexamination of nasal fracture.  Wound care discussed.  Supportive care discussed. Strict ED return precautions advised. Patient understands and agrees to the plan.   Labs and imaging reviewed and considered in medical decision making if ordered.  Imaging interpreted by radiology and personally by me. The plan for this patient was discussed with my attending physician, who voiced agreement and who oversaw evaluation and treatment of this patient.    Note: Estate manager/land agent was used in the creation of this note.  Final Clinical Impression(s) / ED Diagnoses Final diagnoses:  Fall  Closed fracture of nasal bone, initial encounter  Facial laceration, initial encounter    Rx / DC Orders ED Discharge Orders          Ordered    cephALEXin (KEFLEX) 500 MG capsule  4 times daily        09/27/21 2122             Cherly Hensen, DO 09/28/21 0126    Carmin Muskrat, MD 09/29/21 [phone]

## 2021-09-27 NOTE — ED Triage Notes (Signed)
Pt BIB [facility] from [place], pt fell out of her wheelchair, face first, lac to nose, and forehead. A&O to baseline. EMS reports no blood thinner use.

## 2021-09-27 NOTE — ED Notes (Signed)
Patient is resting comfortably. 

## 2021-09-27 NOTE — Progress Notes (Signed)
    Chronic Care Management Pharmacy Assistant   Name: Cindy Gordon  MRN: [id number] DOB: 10/24/1941   I called pt today to update my Med Adherence Report of her last fill date of Atorvastatin 10 mg. Pt confirms she is in a nursing home now and they supply her meds. I will send to Nathan in regards to un-enrolling patient.   Medications: Outpatient Encounter Medications as of 09/27/2021  Medication Sig Note   amLODipine (NORVASC) 2.5 MG tablet TAKE ONE TABLET BY MOUTH EVERY DAY    aspirin EC 81 MG tablet Take 81 mg by mouth daily. Swallow whole.    atorvastatin (LIPITOR) 10 MG tablet Take 10 mg by mouth daily.    Biotin 1000 MCG tablet Take 1,000 mcg by mouth daily.    Blood Glucose Monitoring Suppl (ONE TOUCH ULTRA MINI) w/Device KIT Use daily to check blood sugar levels. E11.42    Cholecalciferol ([employer] VITAMIN D3) 250 MCG (10000 UT) CAPS Take 2 capsules by mouth daily. For 30 days for COVID    cholecalciferol (VITAMIN D3) 25 MCG (1000 UNIT) tablet Take 2,000 Units by mouth daily. 06/21/2021: HOLD    glucose blood (ONETOUCH ULTRA) test strip CHECK BLOOD SUGAR EVERY DAY    guaiFENesin (MUCINEX) 600 MG 12 hr tablet Take 600 mg by mouth 2 (two) times daily.    levothyroxine (SYNTHROID) 75 MCG tablet Take 75 mcg by mouth daily.    Milnacipran HCl (SAVELLA) 100 MG TABS tablet Take 1 tablet (100 mg total) by mouth 2 (two) times daily.    Multiple Vitamin (MULTIVITAMIN) tablet Take 1 tablet by mouth daily.    ondansetron (ZOFRAN) 4 MG tablet Take 4 mg by mouth every 6 (six) hours as needed for nausea or vomiting.    pantoprazole (PROTONIX) 40 MG tablet Take 1 tablet (40 mg total) by mouth daily.    pregabalin (LYRICA) 300 MG capsule TAKE ONE CAPSULE BY MOUTH 2 TIMES A DAY    vitamin B-12 (CYANOCOBALAMIN) 1000 MCG tablet Take 1,000 mcg by mouth daily.    vitamin C (ASCORBIC ACID) 500 MG tablet Take 500 mg by mouth 2 (two) times daily.    No facility-administered encounter medications on file  as of 09/27/2021.   Danielle Gerringer, CMA Clinical Pharmacist Assistant  [phone]  

## 2021-09-27 NOTE — ED Notes (Signed)
Bacitracin and bandaid applied to bridge of nose

## 2021-09-27 NOTE — Discharge Instructions (Addendum)
Dear Cindy Gordon,  Thank you for allowing [place] to take care of you today.  We hope you begin feeling better soon.  - Please follow-up with your primary care physician or schedule an appointment to establish a primary care doctor if you do not have one already. - Please return to the Emergency Department or call 911 for chest pain, shortness of breath, severe pain, altered mental status, or if you have any reason to think you may need emergency medical care. - Keep wounds clean and dry. No baths or submersion for the next 24 hours, after that you may gently wash with unscented soap and water.  -You will need to follow-up with your PCP, urgent care, or ER for removal of the stitches in 5 days.  Follow-up with your PCP regarding recheck of your nasal bone fracture -Continue antibiotics till complete, even if you are feeling better -May use Tylenol every 4-6 hours as needed for pain -Apply bacitracin to wounds without stitches daily   Sincerely,  Cherly Hensen, DO Department of Emergency Medicine [place]   Closed fracture of nasal bone, initial encounter  Fall - Plan: DG Chest [place] [address] DG Pelvis Portable, DG [place] Right [place], DG FEMUR PORT, 1V RIGHT, DG Chest Port 1 View, DG Pelvis Portable, DG Tibia/Fibula Right Port, DG FEMUR PORT, 1V RIGHT  Facial laceration, initial encounter

## 2021-09-27 NOTE — ED Notes (Signed)
X-ray at bedside

## 2021-09-27 NOTE — ED Notes (Signed)
ED Provider at bedside. Placing sutures in pt's head laceration. Pt tolerating well and family at bedside

## 2021-09-28 NOTE — ED Notes (Signed)
Ptar took the pt back to [facility]

## 2022-10-26 ENCOUNTER — Inpatient Hospital Stay (HOSPITAL_COMMUNITY)
Admission: EM | Admit: 2022-10-26 | Discharge: 2022-10-31 | DRG: 682 | Disposition: A | Discharge status: Skilled Nursing Facility | Payer: Medicare Other | Source: Skilled Nursing Facility | Attending: Family Medicine | Admitting: Family Medicine

## 2022-10-26 ENCOUNTER — Emergency Department (HOSPITAL_COMMUNITY): Payer: Medicare Other

## 2022-10-26 ENCOUNTER — Encounter (HOSPITAL_COMMUNITY): Payer: Self-pay | Admitting: Family Medicine

## 2022-10-26 DIAGNOSIS — Z66 Do not resuscitate: Secondary | ICD-10-CM | POA: Diagnosis present

## 2022-10-26 DIAGNOSIS — E1142 Type 2 diabetes mellitus with diabetic polyneuropathy: Secondary | ICD-10-CM | POA: Diagnosis present

## 2022-10-26 DIAGNOSIS — R4182 Altered mental status, unspecified: Secondary | ICD-10-CM | POA: Diagnosis present

## 2022-10-26 DIAGNOSIS — N184 Chronic kidney disease, stage 4 (severe): Secondary | ICD-10-CM | POA: Diagnosis present

## 2022-10-26 DIAGNOSIS — G4733 Obstructive sleep apnea (adult) (pediatric): Secondary | ICD-10-CM | POA: Diagnosis present

## 2022-10-26 DIAGNOSIS — Z820 Family history of epilepsy and other diseases of the nervous system: Secondary | ICD-10-CM | POA: Diagnosis not present

## 2022-10-26 DIAGNOSIS — E038 Other specified hypothyroidism: Secondary | ICD-10-CM | POA: Diagnosis present

## 2022-10-26 DIAGNOSIS — I10 Essential (primary) hypertension: Secondary | ICD-10-CM | POA: Diagnosis present

## 2022-10-26 DIAGNOSIS — I129 Hypertensive chronic kidney disease with stage 1 through stage 4 chronic kidney disease, or unspecified chronic kidney disease: Secondary | ICD-10-CM | POA: Diagnosis present

## 2022-10-26 DIAGNOSIS — Z888 Allergy status to other drugs, medicaments and biological substances status: Secondary | ICD-10-CM

## 2022-10-26 DIAGNOSIS — M797 Fibromyalgia: Secondary | ICD-10-CM | POA: Diagnosis present

## 2022-10-26 DIAGNOSIS — E876 Hypokalemia: Secondary | ICD-10-CM | POA: Diagnosis present

## 2022-10-26 DIAGNOSIS — N319 Neuromuscular dysfunction of bladder, unspecified: Secondary | ICD-10-CM | POA: Diagnosis present

## 2022-10-26 DIAGNOSIS — E11649 Type 2 diabetes mellitus with hypoglycemia without coma: Secondary | ICD-10-CM | POA: Diagnosis not present

## 2022-10-26 DIAGNOSIS — D509 Iron deficiency anemia, unspecified: Secondary | ICD-10-CM | POA: Diagnosis present

## 2022-10-26 DIAGNOSIS — E785 Hyperlipidemia, unspecified: Secondary | ICD-10-CM | POA: Diagnosis present

## 2022-10-26 DIAGNOSIS — G9340 Encephalopathy, unspecified: Secondary | ICD-10-CM | POA: Diagnosis not present

## 2022-10-26 DIAGNOSIS — K219 Gastro-esophageal reflux disease without esophagitis: Secondary | ICD-10-CM | POA: Diagnosis present

## 2022-10-26 DIAGNOSIS — E538 Deficiency of other specified B group vitamins: Secondary | ICD-10-CM | POA: Diagnosis present

## 2022-10-26 DIAGNOSIS — Z823 Family history of stroke: Secondary | ICD-10-CM | POA: Diagnosis not present

## 2022-10-26 DIAGNOSIS — E559 Vitamin D deficiency, unspecified: Secondary | ICD-10-CM | POA: Diagnosis present

## 2022-10-26 DIAGNOSIS — Z79890 Hormone replacement therapy (postmenopausal): Secondary | ICD-10-CM

## 2022-10-26 DIAGNOSIS — E861 Hypovolemia: Secondary | ICD-10-CM | POA: Diagnosis present

## 2022-10-26 DIAGNOSIS — E872 Acidosis, unspecified: Secondary | ICD-10-CM | POA: Diagnosis present

## 2022-10-26 DIAGNOSIS — E860 Dehydration: Secondary | ICD-10-CM | POA: Diagnosis present

## 2022-10-26 DIAGNOSIS — Z1152 Encounter for screening for COVID-19: Secondary | ICD-10-CM | POA: Diagnosis not present

## 2022-10-26 DIAGNOSIS — Z7982 Long term (current) use of aspirin: Secondary | ICD-10-CM

## 2022-10-26 DIAGNOSIS — N179 Acute kidney failure, unspecified: Principal | ICD-10-CM | POA: Diagnosis present

## 2022-10-26 DIAGNOSIS — G9341 Metabolic encephalopathy: Secondary | ICD-10-CM | POA: Diagnosis present

## 2022-10-26 DIAGNOSIS — Z885 Allergy status to narcotic agent status: Secondary | ICD-10-CM

## 2022-10-26 DIAGNOSIS — Z91041 Radiographic dye allergy status: Secondary | ICD-10-CM

## 2022-10-26 DIAGNOSIS — F0392 Unspecified dementia, unspecified severity, with psychotic disturbance: Secondary | ICD-10-CM | POA: Diagnosis present

## 2022-10-26 DIAGNOSIS — Z90710 Acquired absence of both cervix and uterus: Secondary | ICD-10-CM

## 2022-10-26 DIAGNOSIS — Z8249 Family history of ischemic heart disease and other diseases of the circulatory system: Secondary | ICD-10-CM

## 2022-10-26 DIAGNOSIS — Z9049 Acquired absence of other specified parts of digestive tract: Secondary | ICD-10-CM

## 2022-10-26 DIAGNOSIS — E1122 Type 2 diabetes mellitus with diabetic chronic kidney disease: Secondary | ICD-10-CM | POA: Diagnosis present

## 2022-10-26 DIAGNOSIS — Z833 Family history of diabetes mellitus: Secondary | ICD-10-CM

## 2022-10-26 DIAGNOSIS — Z79899 Other long term (current) drug therapy: Secondary | ICD-10-CM

## 2022-10-26 DIAGNOSIS — Z880 Allergy status to penicillin: Secondary | ICD-10-CM

## 2022-10-26 LAB — COMPREHENSIVE METABOLIC PANEL
ALT: 12 U/L (ref 0–44)
AST: 24 U/L (ref 15–41)
Albumin: 2.8 g/dL — ABNORMAL LOW (ref 3.5–5.0)
Alkaline Phosphatase: 65 U/L (ref 38–126)
Anion gap: 12 (ref 5–15)
BUN: 16 mg/dL (ref 8–23)
CO2: 18 mmol/L — ABNORMAL LOW (ref 22–32)
Calcium: 10.2 mg/dL (ref 8.9–10.3)
Chloride: 110 mmol/L (ref 98–111)
Creatinine, Ser: 2.22 mg/dL — ABNORMAL HIGH (ref 0.44–1.00)
GFR, Estimated: 21 mL/min — ABNORMAL LOW (ref >60)
Glucose, Bld: 81 mg/dL (ref 70–99)
Potassium: 4.3 mmol/L (ref 3.5–5.1)
Sodium: 140 mmol/L (ref 135–145)
Total Bilirubin: 0.8 mg/dL (ref 0.3–1.2)
Total Protein: 6.7 g/dL (ref 6.5–8.1)

## 2022-10-26 LAB — VITAMIN B12: Vitamin B-12: 712 pg/mL (ref 180–914)

## 2022-10-26 LAB — CBC WITH DIFFERENTIAL/PLATELET
Abs Immature Granulocytes: 0.03 10*3/uL (ref 0.00–0.07)
Basophils Absolute: 0.1 10*3/uL (ref 0.0–0.1)
Basophils Relative: 1 %
Eosinophils Absolute: 0.3 10*3/uL (ref 0.0–0.5)
Eosinophils Relative: 4 %
HCT: 32.5 % — ABNORMAL LOW (ref 36.0–46.0)
Hemoglobin: 10.1 g/dL — ABNORMAL LOW (ref 12.0–15.0)
Immature Granulocytes: 0 %
Lymphocytes Relative: 21 %
Lymphs Abs: 1.9 10*3/uL (ref 0.7–4.0)
MCH: 31.1 pg (ref 26.0–34.0)
MCHC: 31.1 g/dL (ref 30.0–36.0)
MCV: 100 fL (ref 80.0–100.0)
Monocytes Absolute: 0.8 10*3/uL (ref 0.1–1.0)
Monocytes Relative: 9 %
Neutro Abs: 6.1 10*3/uL (ref 1.7–7.7)
Neutrophils Relative %: 65 %
Platelets: 324 10*3/uL (ref 150–400)
RBC: 3.25 MIL/uL — ABNORMAL LOW (ref 3.87–5.11)
RDW: 14.2 % (ref 11.5–15.5)
WBC: 9.2 10*3/uL (ref 4.0–10.5)
nRBC: 0 % (ref 0.0–0.2)

## 2022-10-26 LAB — URINALYSIS, ROUTINE W REFLEX MICROSCOPIC
Bilirubin Urine: NEGATIVE
Glucose, UA: NEGATIVE mg/dL
Ketones, ur: 5 mg/dL — AB
Nitrite: NEGATIVE
Protein, ur: NEGATIVE mg/dL
Specific Gravity, Urine: 1.009 (ref 1.005–1.030)
pH: 5 (ref 5.0–8.0)

## 2022-10-26 LAB — CBG MONITORING, ED: Glucose-Capillary: 79 mg/dL (ref 70–99)

## 2022-10-26 LAB — PHOSPHORUS: Phosphorus: 3.8 mg/dL (ref 2.5–4.6)

## 2022-10-26 LAB — RESP PANEL BY RT-PCR (RSV, FLU A&B, COVID)  RVPGX2
Influenza A by PCR: NEGATIVE
Influenza B by PCR: NEGATIVE
Resp Syncytial Virus by PCR: NEGATIVE
SARS Coronavirus 2 by RT PCR: NEGATIVE

## 2022-10-26 LAB — LACTIC ACID, PLASMA: Lactic Acid, Venous: 1.2 mmol/L (ref 0.5–1.9)

## 2022-10-26 LAB — AMMONIA: Ammonia: 11 umol/L (ref 9–35)

## 2022-10-26 LAB — TSH: TSH: 0.702 u[IU]/mL (ref 0.350–4.500)

## 2022-10-26 LAB — MAGNESIUM: Magnesium: 1.9 mg/dL (ref 1.7–2.4)

## 2022-10-26 MED ORDER — ACETAMINOPHEN 325 MG PO TABS
650.0000 mg | ORAL_TABLET | Freq: Four times a day (QID) | ORAL | Status: DC | PRN
  Administered 2022-10-27: 650 mg via ORAL
  Filled 2022-10-26: qty 2

## 2022-10-26 MED ORDER — ACETAMINOPHEN 650 MG RE SUPP
650.0000 mg | Freq: Four times a day (QID) | RECTAL | Status: DC | PRN
  Administered 2022-10-26: 650 mg via RECTAL
  Filled 2022-10-26: qty 1

## 2022-10-26 MED ORDER — ONDANSETRON HCL 4 MG PO TABS: 4.0000 mg | ORAL_TABLET | Freq: Four times a day (QID) | ORAL | Status: DC | PRN

## 2022-10-26 MED ORDER — STERILE WATER FOR INJECTION IV SOLN
INTRAVENOUS | Status: DC
  Filled 2022-10-26 (×2): qty 1000

## 2022-10-26 MED ORDER — ASPIRIN 81 MG PO TBEC
81.0000 mg | DELAYED_RELEASE_TABLET | Freq: Every day | ORAL | Status: DC
  Administered 2022-10-27 – 2022-10-30 (×3): 81 mg via ORAL
  Filled 2022-10-26 (×4): qty 1

## 2022-10-26 MED ORDER — PANTOPRAZOLE SODIUM 40 MG PO TBEC
40.0000 mg | DELAYED_RELEASE_TABLET | Freq: Every day | ORAL | Status: DC
  Administered 2022-10-27 – 2022-10-30 (×3): 40 mg via ORAL
  Filled 2022-10-26 (×4): qty 1

## 2022-10-26 MED ORDER — SODIUM CHLORIDE 0.9 % IV BOLUS
500.0000 mL | Freq: Once | INTRAVENOUS | Status: AC
  Administered 2022-10-26: 500 mL via INTRAVENOUS

## 2022-10-26 MED ORDER — LEVOTHYROXINE SODIUM 75 MCG PO TABS
75.0000 ug | ORAL_TABLET | Freq: Every day | ORAL | Status: DC
  Administered 2022-10-28 – 2022-10-31 (×4): 75 ug via ORAL
  Filled 2022-10-26 (×4): qty 1

## 2022-10-26 MED ORDER — ONDANSETRON HCL 4 MG/2ML IJ SOLN: 4.0000 mg | Freq: Four times a day (QID) | INTRAMUSCULAR | Status: DC | PRN

## 2022-10-26 MED ORDER — LABETALOL HCL 5 MG/ML IV SOLN
10.0000 mg | INTRAVENOUS | Status: DC | PRN
  Administered 2022-10-27 – 2022-10-29 (×3): 10 mg via INTRAVENOUS
  Filled 2022-10-26 (×3): qty 4

## 2022-10-26 MED ORDER — HEPARIN SODIUM (PORCINE) 5000 UNIT/ML IJ SOLN
5000.0000 [IU] | Freq: Three times a day (TID) | INTRAMUSCULAR | Status: DC
  Administered 2022-10-26 – 2022-10-31 (×12): 5000 [IU] via SUBCUTANEOUS
  Filled 2022-10-26 (×12): qty 1

## 2022-10-26 NOTE — ED Provider Notes (Signed)
[facility] EMERGENCY DEPARTMENT Provider Note   CSN: [id number] Arrival date & time: 10/26/22  1626     History  Chief Complaint  Patient presents with   Altered Mental Status    Cindy Gordon is a 81 y.o. female.  Pt is a 81 yo female with a pmhx significant for hld, htn, fibromyalgia, dm, osa, hypothyroidism, gerd, and dementia.  She is unable to give any hx.  EMS said the SNF called them because she seems more confused than normal.  Pt has recently finished a round of iv abx (Invanz) for a UTI.  SNF unable to tell EMS how she is altered.  Pt's son is here now and said she is normally able to recognize him.  She is able to have a conversation.  She is not recognizing him and is hallucinating.       Home Medications Prior to Admission medications   Medication Sig Start Date End Date Taking? Authorizing Provider  amLODipine (NORVASC) 2.5 MG tablet TAKE ONE TABLET BY MOUTH EVERY DAY 09/03/20   Cox, Elnita Maxwell, MD  aspirin EC 81 MG tablet Take 81 mg by mouth daily. Swallow whole.    [provider]  atorvastatin (LIPITOR) 10 MG tablet Take 10 mg by mouth daily. 05/07/21   [provider]  Biotin 1000 MCG tablet Take 1,000 mcg by mouth daily.    [provider]  Blood Glucose Monitoring Suppl (ONE TOUCH ULTRA MINI) w/Device KIT Use daily to check blood sugar levels. E11.42 06/20/20   CoxElnita Maxwell, MD  Cholecalciferol (CVS VITAMIN D3) 250 MCG (10000 UT) CAPS Take 2 capsules by mouth daily. For 30 days for COVID    [provider]  cholecalciferol (VITAMIN D3) 25 MCG (1000 UNIT) tablet Take 2,000 Units by mouth daily.    [provider]  glucose blood (ONETOUCH ULTRA) test strip CHECK BLOOD SUGAR EVERY DAY 12/17/20   Cox, Elnita Maxwell, MD  guaiFENesin (MUCINEX) 600 MG 12 hr tablet Take 600 mg by mouth 2 (two) times daily.    [provider]  levothyroxine (SYNTHROID) 75 MCG tablet Take 75 mcg by mouth daily. 06/12/21    [provider]  Milnacipran HCl (SAVELLA) 100 MG TABS tablet Take 1 tablet (100 mg total) by mouth 2 (two) times daily. 07/23/20   CoxElnita Maxwell, MD  Multiple Vitamin (MULTIVITAMIN) tablet Take 1 tablet by mouth daily.    [provider]  ondansetron (ZOFRAN) 4 MG tablet Take 4 mg by mouth every 6 (six) hours as needed for nausea or vomiting.    [provider]  pantoprazole (PROTONIX) 40 MG tablet Take 1 tablet (40 mg total) by mouth daily. 12/17/20   Cox, Elnita Maxwell, MD  pregabalin (LYRICA) 300 MG capsule TAKE ONE CAPSULE BY MOUTH 2 TIMES A DAY 09/20/20   Cox, Kirsten, MD  vitamin B-12 (CYANOCOBALAMIN) 1000 MCG tablet Take 1,000 mcg by mouth daily.    [provider]  vitamin C (ASCORBIC ACID) 500 MG tablet Take 500 mg by mouth 2 (two) times daily.    [provider]      Allergies    Contrast media [iodinated contrast media], Hydrocodone, Codeine, Crestor [rosuvastatin], Lisinopril, Naproxen, Niaspan [niacin], and Penicillins    Review of Systems   Review of Systems  Unable to perform ROS: Dementia    Physical Exam Updated Vital Signs BP (!) 151/100   Pulse 92   Temp 98.5 F (36.9 C) (Oral)   Resp 18   SpO2  100%  Physical Exam Vitals and nursing note reviewed.  Constitutional:      Appearance: Normal appearance.  HENT:     Head: Normocephalic and atraumatic.     Right Ear: External ear normal.     Left Ear: External ear normal.     Nose: Nose normal.     Mouth/Throat:     Mouth: Mucous membranes are dry.  Eyes:     Extraocular Movements: Extraocular movements intact.     Conjunctiva/sclera: Conjunctivae normal.     Pupils: Pupils are equal, round, and reactive to light.  Cardiovascular:     Rate and Rhythm: Normal rate and regular rhythm.     Pulses: Normal pulses.     Heart sounds: Normal heart sounds.  Pulmonary:     Effort: Pulmonary effort is normal.     Breath sounds: Normal breath sounds.  Abdominal:     General: Abdomen  is flat. Bowel sounds are normal.     Palpations: Abdomen is soft.  Musculoskeletal:        General: Normal range of motion.     Cervical back: Normal range of motion and neck supple.  Skin:    General: Skin is warm.     Capillary Refill: Capillary refill takes less than 2 seconds.  Neurological:     Mental Status: She is alert. She is disoriented.  Psychiatric:        Mood and Affect: Mood normal.     ED Results / Procedures / Treatments   Labs (all labs ordered are listed, but only abnormal results are displayed) Labs Reviewed  CBC WITH DIFFERENTIAL/PLATELET - Abnormal; Notable for the following components:      Result Value   RBC 3.25 (*)    Hemoglobin 10.1 (*)    HCT 32.5 (*)    All other components within normal limits  COMPREHENSIVE METABOLIC PANEL - Abnormal; Notable for the following components:   CO2 18 (*)    Creatinine, Ser 2.22 (*)    Albumin 2.8 (*)    GFR, Estimated 21 (*)    All other components within normal limits  URINALYSIS, ROUTINE W REFLEX MICROSCOPIC - Abnormal; Notable for the following components:   APPearance HAZY (*)    Hgb urine dipstick SMALL (*)    Ketones, ur 5 (*)    Leukocytes,Ua SMALL (*)    Bacteria, UA RARE (*)    All other components within normal limits  RESP PANEL BY RT-PCR (RSV, FLU A&B, COVID)  RVPGX2  LACTIC ACID, PLASMA  CBG MONITORING, ED    EKG EKG Interpretation  Date/Time:  Sunday October 26 2022 17:25:40 EST Ventricular Rate:  92 PR Interval:  145 QRS Duration: 87 QT Interval:  373 QTC Calculation: 462 R Axis:   -15 Text Interpretation: Sinus rhythm Abnormal R-wave progression, early transition Left ventricular hypertrophy Inferior infarct, old No significant change since last tracing Confirmed by Isla Pence [phone]) on 10/26/2022 6:34:42 PM  Radiology CT Head Wo Contrast  Result Date: 10/26/2022 CLINICAL DATA:  Mental status change, unknown cause. Urinary tract infection. EXAM: CT HEAD WITHOUT CONTRAST  TECHNIQUE: Contiguous axial images were obtained from the base of the skull through the vertex without intravenous contrast. RADIATION DOSE REDUCTION: This exam was performed according to the departmental dose-optimization program which includes automated exposure control, adjustment of the mA and/or kV according to patient size and/or use of iterative reconstruction technique. COMPARISON:  CT head without contrast 09/27/2021 FINDINGS: Brain: Progressive moderate atrophy and diffuse white  matter disease is present. Prominent white matter hypoattenuation is present posterior limb of the internal capsules bilaterally. Remote ischemic changes are again noted in the thalami. Streak artifact is present across the posterior fossa. No discrete lesions are evident. The ventricles are proportionate to the degree of atrophy. No significant extraaxial fluid collection is present. Vascular: No hyperdense vessel or unexpected calcification. Skull: Calvarium is intact. No focal lytic or blastic lesions are present. No significant extracranial soft tissue lesion is present. Sinuses/Orbits: The paranasal sinuses and mastoid air cells are clear. Fluid levels are present within the sphenoid sinuses bilaterally. The paranasal sinuses and mastoid air cells are otherwise clear. IMPRESSION: 1. Progressive moderate atrophy and diffuse white matter disease. This likely reflects the sequela of chronic microvascular ischemia. 2. Remote ischemic changes in the thalami bilaterally. 3. Fluid levels within the sphenoid sinuses bilaterally. This likely reflects acute sinusitis. Electronically Signed   By: San Morelle M.D.   On: 10/26/2022 17:15   DG Chest Portable 1 View  Result Date: 10/26/2022 CLINICAL DATA:  Altered mental status. EXAM: PORTABLE CHEST 1 VIEW COMPARISON:  One-view chest x-ray 09/27/2021 FINDINGS: Heart is mildly enlarged, stable. Atherosclerotic calcifications are again noted at the aortic arch. Right perihilar  patchy airspace opacities are present. Left basilar opacities are also present. The upper lung fields are clear. Visualized soft tissues and bony thorax are stable. IMPRESSION: Patchy right perihilar and left basilar airspace disease. While this may represent atelectasis, infection is not excluded. Electronically Signed   By: San Morelle M.D.   On: 10/26/2022 16:46    Procedures Procedures    Medications Ordered in ED Medications  sodium chloride 0.9 % bolus 500 mL (500 mLs Intravenous New Bag/Given 10/26/22 1753)  sodium chloride 0.9 % bolus 500 mL (500 mLs Intravenous New Bag/Given 10/26/22 1844)    ED Course/ Medical Decision Making/ A&P                           Medical Decision Making Amount and/or Complexity of Data Reviewed Labs: ordered. Radiology: ordered.  Risk Decision regarding hospitalization.   This patient presents to the ED for concern of ams, this involves an extensive number of treatment options, and is a complaint that carries with it a high risk of complications and morbidity.  The differential diagnosis includes infection, cva, anemia, electrolyte abn   Co morbidities that complicate the patient evaluation  hld, htn, fibromyalgia, dm, osa, hypothyroidism, gerd, and dementia.   Additional history obtained:  Additional history obtained from epic chart review External records from outside source obtained and reviewed including EMS report   Lab Tests:  I Ordered, and personally interpreted labs.  The pertinent results include:  cbc with hgb 10.1 (chronic), cmp with cr elevated at 2.22 (cr 1.3 last year); lactic nl, covid/flu neg; ua with ketones, rare bacteria    Imaging Studies ordered:  I ordered imaging studies including ct head and cxr  I independently visualized and interpreted imaging which showed  CT head: . Progressive moderate atrophy and diffuse white matter disease.  This likely reflects the sequela of chronic microvascular  ischemia.  2. Remote ischemic changes in the thalami bilaterally.  3. Fluid levels within the sphenoid sinuses bilaterally. This likely  reflects acute sinusitis.  CXR: Patchy right perihilar and left basilar airspace disease. While this  may represent atelectasis, infection is not excluded.   I agree with the radiologist interpretation   Cardiac Monitoring:  The patient  was maintained on a cardiac monitor.  I personally viewed and interpreted the cardiac monitored which showed an underlying rhythm of: nsr   Medicines ordered and prescription drug management:  I ordered medication including ivfs  for aki  Reevaluation of the patient after these medicines showed that the patient improved I have reviewed the patients home medicines and have made adjustments as needed   Test Considered:  ct   Critical Interventions:  ivfs   Consultations Obtained:  I requested consultation with the hospitalist (Dr. Myna Hidalgo),  and discussed lab and imaging findings as well as pertinent plan - he will admit  Problem List / ED Course:  AMS:  pt just finished invanz.  Urine looks ok, but will send for culture.  Pt's CXR with atelectasis vs pna.  Pt is not febrile, sob, tachypneic and does not have a cough, so I don't think she has pna.  AKI:  pt given IVFs   Reevaluation:  After the interventions noted above, I reevaluated the patient and found that they have :improved   Social Determinants of Health:  Lives in a SNF   Dispostion:  After consideration of the diagnostic results and the patients response to treatment, I feel that the patent would benefit from admission.          Final Clinical Impression(s) / ED Diagnoses Final diagnoses:  Altered mental status, unspecified altered mental status type  Dehydration  AKI (acute kidney injury) [facility])    Rx / [place] Orders ED Discharge Orders     None         Isla Pence, MD 10/26/22 2021

## 2022-10-26 NOTE — ED Notes (Signed)
Patient was incontinent to stool. Patient was changed and and soiled linens removed. Patient is now laying in bed.

## 2022-10-26 NOTE — H&P (Signed)
History and Physical    Conchetta Lamia JTT:[id number] DOB: 01/02/1941 DOA: 10/26/2022  PCP: Patient, No Pcp Per   Patient coming from: SNF   Chief Complaint: AMS   HPI: Niko Jakel is a 81 y.o. female with medical history significant for memory loss, hypertension, diet-controlled diabetes mellitus, hyperlipidemia, fibromyalgia, and memory loss who presents from her SNF for evaluation of altered mental status.  She has reportedly been undergoing treatment with Invanz for close to a week at her nursing facility for UTI when she developed hallucinations and confusion and stopped eating on 10/24/2022.  She is unable to recognize her son at the bedside.  Her son notes that when he took her out to eat a couple weeks ago, she was able to recognize acquaintances she had not seen in many years and remember their names.  She is typically able to hold a conversation.  Per her family, she has not had a cough or upper respiratory symptoms as far as they can tell, and has not been complaining of anything.  ED Course: Upon arrival to the ED, patient is found to be afebrile and saturating well with stable blood pressure.  EKG demonstrates sinus rhythm with LVH.  Chest x-ray with perihilar and basilar airspace disease.  Head CT with progressive atrophy and diffuse white matter disease as well as fluid levels in the bilateral sphenoid sinuses.  Creatinine is 2.22 and serum bicarbonate 18.  Albumin is 2.8 and hemoglobin 10.1.  Patient was given a liter of normal saline in the ED.  Review of Systems:  ROS limited by the patient's clinical condition.  Past Medical History:  Diagnosis Date   Atrophy of thyroid (acquired)    Carpal tunnel syndrome    Chronic back pain    Diabetes mellitus without complication (HCC)    Fibromyalgia    Gastro-esophageal reflux disease without esophagitis    Hyperlipidemia    Hypertension    Irritable bowel syndrome    Localized edema    Localized swelling, mass and  lump, neck    Neuromuscular dysfunction of bladder, unspecified    Obstructive sleep apnea (adult) (pediatric)    Other obesity    Other specified hypothyroidism    Sciatica, right side    Urge incontinence    Vitamin B 12 deficiency    Vitamin D deficiency     Past Surgical History:  Procedure Laterality Date   APPENDECTOMY     BLADDER SUSPENSION     BREAST BIOPSY     right breast: benign   carpel tunnel repair Left 08/2014   CATARACT EXTRACTION  2019   right    CHOLECYSTECTOMY OPEN  1980   acute panceatitis:requiring removal of stones from pancreatitic duct   dobutamine nuclear stress test     12/2010   HYSTERECTOMY ABDOMINAL WITH SALPINGO-OOPHORECTOMY     LUMBAR LAMINECTOMY     RECTOCELE REPAIR  02/2011   REVERSE TOTAL SHOULDER ARTHROPLASTY  07/2017   TOTAL SHOULDER REPLACEMENT  2002   secondary to staph infection   VEIN LIGATION AND STRIPPING      Social History:   reports that she has never smoked. She has never used smokeless tobacco. She reports that she does not drink alcohol and does not use drugs.  Allergies  Allergen Reactions   Contrast Media [Iodinated Contrast Media] Other (See Comments)    Cardiac arrest   Hydrocodone Other (See Comments)    Hallucinations   Codeine    Crestor [Rosuvastatin] Other (See Comments)  Myalgia    Lisinopril    Naproxen     constipation   Niaspan [Niacin] Other (See Comments)    flushing   Penicillins     Itchy.    Family History  Problem Relation Age of Onset   Alzheimer's disease Mother    CVA Mother    Alzheimer's disease Sister    Diabetes Sister    Hypertension Sister    Hypothyroidism Sister    Congenital heart disease Sister    Prostate cancer Son    Coronary artery disease Daughter    Peptic Ulcer Other    Diabetes type II Other    Cystic fibrosis Grandson      Prior to Admission medications   Medication Sig Start Date End Date Taking? Authorizing Provider  amLODipine (NORVASC) 2.5 MG tablet  TAKE ONE TABLET BY MOUTH EVERY DAY 09/03/20   Cox, Elnita Maxwell, MD  aspirin EC 81 MG tablet Take 81 mg by mouth daily. Swallow whole.    [provider]  atorvastatin (LIPITOR) 10 MG tablet Take 10 mg by mouth daily. 05/07/21   [provider]  Biotin 1000 MCG tablet Take 1,000 mcg by mouth daily.    [provider]  Blood Glucose Monitoring Suppl (ONE TOUCH ULTRA MINI) w/Device KIT Use daily to check blood sugar levels. E11.42 06/20/20   CoxElnita Maxwell, MD  Cholecalciferol ([employer] VITAMIN D3) 250 MCG (10000 UT) CAPS Take 2 capsules by mouth daily. For 30 days for COVID    [provider]  cholecalciferol (VITAMIN D3) 25 MCG (1000 UNIT) tablet Take 2,000 Units by mouth daily.    [provider]  glucose blood (ONETOUCH ULTRA) test strip CHECK BLOOD SUGAR EVERY DAY 12/17/20   Cox, Elnita Maxwell, MD  guaiFENesin (MUCINEX) 600 MG 12 hr tablet Take 600 mg by mouth 2 (two) times daily.    [provider]  levothyroxine (SYNTHROID) 75 MCG tablet Take 75 mcg by mouth daily. 06/12/21   [provider]  Milnacipran HCl (SAVELLA) 100 MG TABS tablet Take 1 tablet (100 mg total) by mouth 2 (two) times daily. 07/23/20   CoxElnita Maxwell, MD  Multiple Vitamin (MULTIVITAMIN) tablet Take 1 tablet by mouth daily.    [provider]  ondansetron (ZOFRAN) 4 MG tablet Take 4 mg by mouth every 6 (six) hours as needed for nausea or vomiting.    [provider]  pantoprazole (PROTONIX) 40 MG tablet Take 1 tablet (40 mg total) by mouth daily. 12/17/20   Cox, Elnita Maxwell, MD  pregabalin (LYRICA) 300 MG capsule TAKE ONE CAPSULE BY MOUTH 2 TIMES A DAY 09/20/20   Cox, Kirsten, MD  vitamin B-12 (CYANOCOBALAMIN) 1000 MCG tablet Take 1,000 mcg by mouth daily.    [provider]  vitamin C (ASCORBIC ACID) 500 MG tablet Take 500 mg by mouth 2 (two) times daily.    [provider]    Physical Exam: Vitals:   10/26/22 1645 10/26/22 1706 10/26/22 1745 10/26/22 1830   BP: (!) 156/85  137/70 (!) 151/100  Pulse: 89  92 92  Resp: (!) 22  (!) 23 18  Temp:  98.5 F (36.9 C)    TempSrc:  Oral    SpO2: 98%  100% 100%     Constitutional: NAD, no pallor or diaphoresis   Eyes: PERTLA, lids and conjunctivae normal ENMT: Mucous membranes are dry. Posterior pharynx clear of any exudate or lesions.   Neck: supple, no masses  Respiratory: no wheezing, no crackles. No accessory  muscle use.  Cardiovascular: S1 & S2 heard, regular rate and rhythm. No extremity edema.   Abdomen: No distension, no tenderness, soft. Bowel sounds active.  Musculoskeletal: no clubbing / cyanosis. No joint deformity upper and lower extremities.   Skin: no significant rashes, lesions, ulcers. Warm, dry, well-perfused. Neurologic: CN 2-12 grossly intact. Moving all extremities. Alert, oriented to self only.  Psychiatric: Calm. Cooperative.    Labs and Imaging on Admission: I have personally reviewed following labs and imaging studies  CBC: Recent Labs  Lab 10/26/22 1634  WBC 9.2  NEUTROABS 6.1  HGB 10.1*  HCT 32.5*  MCV 100.0  PLT 626   Basic Metabolic Panel: Recent Labs  Lab 10/26/22 1634  NA 140  K 4.3  CL 110  CO2 18*  GLUCOSE 81  BUN 16  CREATININE 2.22*  CALCIUM 10.2   GFR: CrCl cannot be calculated (Unknown ideal weight.). Liver Function Tests: Recent Labs  Lab 10/26/22 1634  AST 24  ALT 12  ALKPHOS 65  BILITOT 0.8  PROT 6.7  ALBUMIN 2.8*   No results for input(s): "LIPASE", "AMYLASE" in the last 168 hours. No results for input(s): "AMMONIA" in the last 168 hours. Coagulation Profile: No results for input(s): "INR", "PROTIME" in the last 168 hours. Cardiac Enzymes: No results for input(s): "CKTOTAL", "CKMB", "CKMBINDEX", "TROPONINI" in the last 168 hours. BNP (last 3 results) No results for input(s): "PROBNP" in the last 8760 hours. HbA1C: No results for input(s): "HGBA1C" in the last 72 hours. CBG: Recent Labs  Lab 10/26/22 1721  GLUCAP  79   Lipid Profile: No results for input(s): "CHOL", "HDL", "LDLCALC", "TRIG", "CHOLHDL", "LDLDIRECT" in the last 72 hours. Thyroid Function Tests: No results for input(s): "TSH", "T4TOTAL", "FREET4", "T3FREE", "THYROIDAB" in the last 72 hours. Anemia Panel: No results for input(s): "VITAMINB12", "FOLATE", "FERRITIN", "TIBC", "IRON", "RETICCTPCT" in the last 72 hours. Urine analysis:    Component Value Date/Time   COLORURINE YELLOW 10/26/2022 1854   APPEARANCEUR HAZY (A) 10/26/2022 1854   LABSPEC 1.009 10/26/2022 1854   PHURINE 5.0 10/26/2022 1854   GLUCOSEU NEGATIVE 10/26/2022 1854   HGBUR SMALL (A) 10/26/2022 1854   BILIRUBINUR NEGATIVE 10/26/2022 1854   BILIRUBINUR negative 12/24/2020 1008   BILIRUBINUR neg 07/23/2020 1152   KETONESUR 5 (A) 10/26/2022 1854   PROTEINUR NEGATIVE 10/26/2022 1854   UROBILINOGEN 0.2 12/24/2020 1008   NITRITE NEGATIVE 10/26/2022 1854   LEUKOCYTESUR SMALL (A) 10/26/2022 1854   Sepsis Labs: _0 (procalcitonin:4,lacticidven:4) ) Recent Results (from the past 240 hour(s))  Resp panel by RT-PCR (RSV, Flu A&B, Covid) Anterior Nasal Swab     Status: None   Collection Time: 10/26/22  5:32 PM   Specimen: Anterior Nasal Swab  Result Value Ref Range Status   SARS Coronavirus 2 by RT PCR NEGATIVE NEGATIVE Final    Comment: (NOTE) SARS-CoV-2 target nucleic acids are NOT DETECTED.  The SARS-CoV-2 RNA is generally detectable in upper respiratory specimens during the acute phase of infection. The lowest concentration of SARS-CoV-2 viral copies this assay can detect is 138 copies/mL. A negative result does not preclude SARS-Cov-2 infection and should not be used as the sole basis for treatment or other patient management decisions. A negative result may occur with  improper specimen collection/handling, submission of specimen other than nasopharyngeal swab, presence of viral mutation(s) within the areas targeted by this assay, and inadequate number of  viral copies(<138 copies/mL). A negative result must be combined with clinical observations, patient history, and epidemiological information. The expected result  is Negative.  Fact Sheet for Patients:  EntrepreneurPulse.com.au  Fact Sheet for Healthcare Providers:  IncredibleEmployment.be  This test is no t yet approved or cleared by the [place] FDA and  has been authorized for detection and/or diagnosis of SARS-CoV-2 by FDA under an Emergency Use Authorization (EUA). This EUA will remain  in effect (meaning this test can be used) for the duration of the COVID-19 declaration under Section 564(b)(1) of the Act, 21 U.S.C.section 360bbb-3(b)(1), unless the authorization is terminated  or revoked sooner.       Influenza A by PCR NEGATIVE NEGATIVE Final   Influenza B by PCR NEGATIVE NEGATIVE Final    Comment: (NOTE) The Xpert Xpress SARS-CoV-2/FLU/RSV plus assay is intended as an aid in the diagnosis of influenza from Nasopharyngeal swab specimens and should not be used as a sole basis for treatment. Nasal washings and aspirates are unacceptable for Xpert Xpress SARS-CoV-2/FLU/RSV testing.  Fact Sheet for Patients: EntrepreneurPulse.com.au  Fact Sheet for Healthcare Providers: IncredibleEmployment.be  This test is not yet approved or cleared by the [place] FDA and has been authorized for detection and/or diagnosis of SARS-CoV-2 by FDA under an Emergency Use Authorization (EUA). This EUA will remain in effect (meaning this test can be used) for the duration of the COVID-19 declaration under Section 564(b)(1) of the Act, 21 U.S.C. section 360bbb-3(b)(1), unless the authorization is terminated or revoked.     Resp Syncytial Virus by PCR NEGATIVE NEGATIVE Final    Comment: (NOTE) Fact Sheet for Patients: EntrepreneurPulse.com.au  Fact Sheet for Healthcare  Providers: IncredibleEmployment.be  This test is not yet approved or cleared by the [place] FDA and has been authorized for detection and/or diagnosis of SARS-CoV-2 by FDA under an Emergency Use Authorization (EUA). This EUA will remain in effect (meaning this test can be used) for the duration of the COVID-19 declaration under Section 564(b)(1) of the Act, 21 U.S.C. section 360bbb-3(b)(1), unless the authorization is terminated or revoked.  Performed at [facility], [place] [address], [place], [place] [address]      Radiological Exams on Admission: CT Head Wo Contrast  Result Date: 10/26/2022 CLINICAL DATA:  Mental status change, unknown cause. Urinary tract infection. EXAM: CT HEAD WITHOUT CONTRAST TECHNIQUE: Contiguous axial images were obtained from the base of the skull through the vertex without intravenous contrast. RADIATION DOSE REDUCTION: This exam was performed according to the departmental dose-optimization program which includes automated exposure control, adjustment of the mA and/or kV according to patient size and/or use of iterative reconstruction technique. COMPARISON:  CT head without contrast 09/27/2021 FINDINGS: Brain: Progressive moderate atrophy and diffuse white matter disease is present. Prominent white matter hypoattenuation is present posterior limb of the internal capsules bilaterally. Remote ischemic changes are again noted in the thalami. Streak artifact is present across the posterior fossa. No discrete lesions are evident. The ventricles are proportionate to the degree of atrophy. No significant extraaxial fluid collection is present. Vascular: No hyperdense vessel or unexpected calcification. Skull: Calvarium is intact. No focal lytic or blastic lesions are present. No significant extracranial soft tissue lesion is present. Sinuses/Orbits: The paranasal sinuses and mastoid air cells are clear. Fluid levels are present within the sphenoid  sinuses bilaterally. The paranasal sinuses and mastoid air cells are otherwise clear. IMPRESSION: 1. Progressive moderate atrophy and diffuse white matter disease. This likely reflects the sequela of chronic microvascular ischemia. 2. Remote ischemic changes in the thalami bilaterally. 3. Fluid levels within the sphenoid sinuses bilaterally. This likely  reflects acute sinusitis. Electronically Signed   By: San Morelle M.D.   On: 10/26/2022 17:15   DG Chest Portable 1 View  Result Date: 10/26/2022 CLINICAL DATA:  Altered mental status. EXAM: PORTABLE CHEST 1 VIEW COMPARISON:  One-view chest x-ray 09/27/2021 FINDINGS: Heart is mildly enlarged, stable. Atherosclerotic calcifications are again noted at the aortic arch. Right perihilar patchy airspace opacities are present. Left basilar opacities are also present. The upper lung fields are clear. Visualized soft tissues and bony thorax are stable. IMPRESSION: Patchy right perihilar and left basilar airspace disease. While this may represent atelectasis, infection is not excluded. Electronically Signed   By: San Morelle M.D.   On: 10/26/2022 16:46    EKG: Independently reviewed. Sinus rhythm, LVH.   Assessment/Plan   1. Acute encephalopathy  - Head CT with progressive atrophy and diffuse white matter disease  - Her confusion and hallucinations could certainly be secondary to Invanz, or metabolic in setting of AKI  - Treat AKI, check mag, phos, TSH, B12, and ammonia, hold Invanz, use delirium precautions   2. AKI  - SCr is 2.22 on admission; baseline unclear, SCr was 1.28 in November 2022 - Serum bicarbonate is 18, potassium normal  - Likely prerenal given recent anorexia  - Continue IVF hydration, renally-dose medications, repeat chem panel in am   3. Hypothyroidism  - Continue Synthroid   4. Hypertension  - No longer on antihypertensives, will treat as-needed only     DVT prophylaxis: sq heparin  Code Status: DNR  Level of  Care: Level of care: Med-Surg Family Communication: Sons at bedside   Disposition Plan:  Patient is from: SNF  Anticipated d/c is to: SNF  Anticipated d/c date is: 10/29/22  Patient currently: pending improved/stable renal function, improved mental status  Consults called: none  Admission status: Inpatient     Vianne Bulls, MD [facility]  10/26/2022, 8:23 PM

## 2022-10-26 NOTE — ED Triage Notes (Signed)
Pt BIB [facility] from [facility]. Staff at the facility notice she has been confuse  and altered since Friday. EMS reported staff said patient had a UTI for a week and been  on iv antibiotic.

## 2022-10-27 ENCOUNTER — Inpatient Hospital Stay (HOSPITAL_COMMUNITY): Payer: Medicare Other

## 2022-10-27 ENCOUNTER — Encounter (HOSPITAL_COMMUNITY): Payer: Self-pay | Admitting: Family Medicine

## 2022-10-27 ENCOUNTER — Other Ambulatory Visit: Payer: Self-pay

## 2022-10-27 DIAGNOSIS — G9340 Encephalopathy, unspecified: Secondary | ICD-10-CM | POA: Diagnosis not present

## 2022-10-27 LAB — BASIC METABOLIC PANEL
Anion gap: 10 (ref 5–15)
BUN: 11 mg/dL (ref 8–23)
CO2: 29 mmol/L (ref 22–32)
Calcium: 8.4 mg/dL — ABNORMAL LOW (ref 8.9–10.3)
Chloride: 101 mmol/L (ref 98–111)
Creatinine, Ser: 1.85 mg/dL — ABNORMAL HIGH (ref 0.44–1.00)
GFR, Estimated: 27 mL/min — ABNORMAL LOW (ref >60)
Glucose, Bld: 66 mg/dL — ABNORMAL LOW (ref 70–99)
Potassium: 3.1 mmol/L — ABNORMAL LOW (ref 3.5–5.1)
Sodium: 140 mmol/L (ref 135–145)

## 2022-10-27 LAB — CBC
HCT: 28.1 % — ABNORMAL LOW (ref 36.0–46.0)
Hemoglobin: 8.9 g/dL — ABNORMAL LOW (ref 12.0–15.0)
MCH: 30.8 pg (ref 26.0–34.0)
MCHC: 31.7 g/dL (ref 30.0–36.0)
MCV: 97.2 fL (ref 80.0–100.0)
Platelets: 276 10*3/uL (ref 150–400)
RBC: 2.89 MIL/uL — ABNORMAL LOW (ref 3.87–5.11)
RDW: 14.2 % (ref 11.5–15.5)
WBC: 7.8 10*3/uL (ref 4.0–10.5)
nRBC: 0 % (ref 0.0–0.2)

## 2022-10-27 LAB — RETICULOCYTES
Immature Retic Fract: 12 % (ref 2.3–15.9)
RBC.: 2.86 MIL/uL — ABNORMAL LOW (ref 3.87–5.11)
Retic Count, Absolute: 32.6 10*3/uL (ref 19.0–186.0)
Retic Ct Pct: 1.1 % (ref 0.4–3.1)

## 2022-10-27 LAB — IRON AND TIBC
Iron: 20 ug/dL — ABNORMAL LOW (ref 28–170)
Saturation Ratios: 11 % (ref 10.4–31.8)
TIBC: 176 ug/dL — ABNORMAL LOW (ref 250–450)
UIBC: 156 ug/dL

## 2022-10-27 LAB — FOLATE: Folate: >40 ng/mL (ref >5.9)

## 2022-10-27 LAB — FERRITIN: Ferritin: 326 ng/mL — ABNORMAL HIGH (ref 11–307)

## 2022-10-27 LAB — MRSA NEXT GEN BY PCR, NASAL: MRSA by PCR Next Gen: DETECTED — AB

## 2022-10-27 LAB — CBG MONITORING, ED: Glucose-Capillary: 67 mg/dL — ABNORMAL LOW (ref 70–99)

## 2022-10-27 LAB — GLUCOSE, CAPILLARY
Glucose-Capillary: 114 mg/dL — ABNORMAL HIGH (ref 70–99)
Glucose-Capillary: 118 mg/dL — ABNORMAL HIGH (ref 70–99)

## 2022-10-27 MED ORDER — ORAL CARE MOUTH RINSE: 15.0000 mL | OROMUCOSAL | Status: DC | PRN

## 2022-10-27 MED ORDER — LORAZEPAM 2 MG/ML IJ SOLN
0.5000 mg | Freq: Once | INTRAMUSCULAR | Status: AC | PRN
  Administered 2022-10-28: 0.5 mg via INTRAVENOUS
  Filled 2022-10-27: qty 1

## 2022-10-27 MED ORDER — DEXTROSE IN LACTATED RINGERS 5 % IV SOLN: INTRAVENOUS | Status: DC

## 2022-10-27 MED ORDER — HYDRALAZINE HCL 20 MG/ML IJ SOLN
10.0000 mg | Freq: Three times a day (TID) | INTRAMUSCULAR | Status: DC | PRN
  Administered 2022-10-27: 10 mg via INTRAVENOUS
  Filled 2022-10-27: qty 1

## 2022-10-27 MED ORDER — LORAZEPAM 2 MG/ML IJ SOLN: 0.5000 mg | Freq: Once | INTRAMUSCULAR | Status: DC

## 2022-10-27 MED ORDER — AMLODIPINE BESYLATE 5 MG PO TABS
2.5000 mg | ORAL_TABLET | Freq: Every day | ORAL | Status: DC
  Administered 2022-10-27: 2.5 mg via ORAL
  Filled 2022-10-27: qty 1

## 2022-10-27 MED ORDER — POTASSIUM CHLORIDE CRYS ER 20 MEQ PO TBCR
40.0000 meq | EXTENDED_RELEASE_TABLET | Freq: Once | ORAL | Status: AC
  Administered 2022-10-27: 40 meq via ORAL
  Filled 2022-10-27: qty 2

## 2022-10-27 MED ORDER — QUETIAPINE FUMARATE 25 MG PO TABS
12.5000 mg | ORAL_TABLET | Freq: Two times a day (BID) | ORAL | Status: DC | PRN
  Administered 2022-10-27 – 2022-10-28 (×2): 12.5 mg via ORAL
  Filled 2022-10-27 (×2): qty 1

## 2022-10-27 NOTE — Progress Notes (Signed)
Spoke with son over the phone to complete admission due to patient being confused.

## 2022-10-27 NOTE — Plan of Care (Signed)
Patient hallucinating and seems to be getting worse. She has been hollering "help" because she is seeing people in her room, fires, and stated that someone was on the floor with their "head busted open". MD notified of behavior. Orders received for PRN seroquel and given to patient.

## 2022-10-27 NOTE — Plan of Care (Signed)

## 2022-10-27 NOTE — ED Notes (Signed)
Patient was incontinent to urine. Patient was cleaned and changed and is laying in bed.

## 2022-10-27 NOTE — ED Notes (Signed)
Patient resting spuine in bed. No signs o distress noted.

## 2022-10-27 NOTE — Progress Notes (Addendum)
PROGRESS NOTE    Azelyn Batie  ERX:[id number] DOB: August 03, 1941 DOA: 10/26/2022 PCP: Patient, No Pcp Per   Brief Narrative: 81 year old with past medical history significant for memory loss, hypertension, diet-controlled diabetes, hyperlipidemia, fibromyalgia, who presents from skilled nursing facility for evaluation of altered mental status.  Patient has been undergoing treatment with Invanz for almost a week at her skilled nursing facility for UTI when she developed hallucination and confusion and stopped eating on 10/24/2022.  She was not able to recognize her son.   Evaluation in the ED.  CT head with progressive atrophy and diffuse white matter disease.  Chest x-ray with perihilar basilar airspace disease.  Presented with a creatinine of 2.2  Admitted for AKI and acute encephalopathy and further evaluation.   Assessment & Plan:   Principal Problem:   Acute encephalopathy Active Problems:   Essential hypertension, benign   Controlled type 2 diabetes mellitus with diabetic polyneuropathy, without long-term current use of insulin (HCC)   AKI (acute kidney injury) ([place])   Metabolic acidosis   1-Acute metabolic encephalopathy: CT Head: Progressive moderate atrophy and diffuse white matter disease. This likely reflects the sequela of chronic microvascular ischemia. Remote ischemic changes in the thalami bilaterally. -UA: 21-50 -B12: 712.---Ammonia 11.  -Could be related to AKI, dehydration, [employer].  -Will check MRI brain   2-AKI: -Patient presented with a creatinine of 2.2, prior creatinine per records 1.11 September 2021.  Unclear recent baseline. -Likely hypovolemia, prerenal in the setting of poor oral intake. -Continue with IV fluids -Improving with IV fluids, Cr down to 1.8  Hypothyroidism: -Continue with Synthroid  Hypertension: -SBP elevated.  -Start Norvasc.  -PRN labetalol.   Hypokalemia;  -Replete orally.   Anemia:  -Will check anemia panel.   Pyuria;   -Check urine culture.   Hypoglycemia;  Change IV fluid to D 5 LR.    Pressure Injury 06/21/21 Coccyx Mid Stage 2 -  Partial thickness loss of dermis presenting as a shallow open injury with a red, pink wound bed without slough. (Active)  06/21/21 2047  Location: Coccyx  Location Orientation: Mid  Staging: Stage 2 -  Partial thickness loss of dermis presenting as a shallow open injury with a red, pink wound bed without slough.  Wound Description (Comments):   Present on Admission: Yes     Estimated body mass index is 28.32 kg/m as calculated from the following:   Height as of 09/27/21: 5' (1.524 m).   Weight as of 09/27/21: 65.8 kg.   DVT prophylaxis: Heparin  Code Status: DNR Family Communication: Disposition Plan:  Status is: Inpatient Remains inpatient appropriate because: management encephalopathy     Consultants:    Procedures:    Antimicrobials:    Subjective: She is alert, not oriented, confuse, calm.    Objective: Vitals:   10/26/22 2315 10/26/22 2342 10/27/22 0006 10/27/22 0433  BP: (!) 156/117  (!) 142/57 (!) 134/95  Pulse: 88  91 90  Resp: '14  19 20  '$ Temp:  98.7 F (37.1 C)  98.2 F (36.8 C)  TempSrc:    Oral  SpO2: 99%  99% 99%   No intake or output data in the 24 hours ending 10/27/22 0807 There were no vitals filed for this visit.  Examination:  General exam: Appears calm and comfortable  Respiratory system: Clear to auscultation. Respiratory effort normal. Cardiovascular system: S1 & S2 heard, RRR. No JVD, murmurs, rubs, gallops or clicks. No pedal edema. Gastrointestinal system: Abdomen is nondistended, soft and nontender. No  organomegaly or masses felt. Normal bowel sounds heard. Central nervous system: alert, follows command Extremities: Symmetric 5 x 5 power.   Data Reviewed: I have personally reviewed following labs and imaging studies  CBC: Recent Labs  Lab 10/26/22 1634 10/27/22 0430  WBC 9.2 7.8  NEUTROABS 6.1  --    HGB 10.1* 8.9*  HCT 32.5* 28.1*  MCV 100.0 97.2  PLT 324 268   Basic Metabolic Panel: Recent Labs  Lab 10/26/22 1634 10/26/22 2124 10/27/22 0430  NA 140  --  140  K 4.3  --  3.1*  CL 110  --  101  CO2 18*  --  29  GLUCOSE 81  --  66*  BUN 16  --  11  CREATININE 2.22*  --  1.85*  CALCIUM 10.2  --  8.4*  MG  --  1.9  --   PHOS  --  3.8  --    GFR: CrCl cannot be calculated (Unknown ideal weight.). Liver Function Tests: Recent Labs  Lab 10/26/22 1634  AST 24  ALT 12  ALKPHOS 65  BILITOT 0.8  PROT 6.7  ALBUMIN 2.8*   No results for input(s): "LIPASE", "AMYLASE" in the last 168 hours. Recent Labs  Lab 10/26/22 2124  AMMONIA 11   Coagulation Profile: No results for input(s): "INR", "PROTIME" in the last 168 hours. Cardiac Enzymes: No results for input(s): "CKTOTAL", "CKMB", "CKMBINDEX", "TROPONINI" in the last 168 hours. BNP (last 3 results) No results for input(s): "PROBNP" in the last 8760 hours. HbA1C: No results for input(s): "HGBA1C" in the last 72 hours. CBG: Recent Labs  Lab 10/26/22 1721  GLUCAP 79   Lipid Profile: No results for input(s): "CHOL", "HDL", "LDLCALC", "TRIG", "CHOLHDL", "LDLDIRECT" in the last 72 hours. Thyroid Function Tests: Recent Labs    10/26/22 2124  TSH 0.702   Anemia Panel: Recent Labs    10/26/22 2124  VITAMINB12 712   Sepsis Labs: Recent Labs  Lab 10/26/22 1635  LATICACIDVEN 1.2    Recent Results (from the past 240 hour(s))  Resp panel by RT-PCR (RSV, Flu A&B, Covid) Anterior Nasal Swab     Status: None   Collection Time: 10/26/22  5:32 PM   Specimen: Anterior Nasal Swab  Result Value Ref Range Status   SARS Coronavirus 2 by RT PCR NEGATIVE NEGATIVE Final    Comment: (NOTE) SARS-CoV-2 target nucleic acids are NOT DETECTED.  The SARS-CoV-2 RNA is generally detectable in upper respiratory specimens during the acute phase of infection. The lowest concentration of SARS-CoV-2 viral copies this assay can  detect is 138 copies/mL. A negative result does not preclude SARS-Cov-2 infection and should not be used as the sole basis for treatment or other patient management decisions. A negative result may occur with  improper specimen collection/handling, submission of specimen other than nasopharyngeal swab, presence of viral mutation(s) within the areas targeted by this assay, and inadequate number of viral copies(<138 copies/mL). A negative result must be combined with clinical observations, patient history, and epidemiological information. The expected result is Negative.  Fact Sheet for Patients:  EntrepreneurPulse.com.au  Fact Sheet for Healthcare Providers:  IncredibleEmployment.be  This test is no t yet approved or cleared by the [place] FDA and  has been authorized for detection and/or diagnosis of SARS-CoV-2 by FDA under an Emergency Use Authorization (EUA). This EUA will remain  in effect (meaning this test can be used) for the duration of the COVID-19 declaration under Section 564(b)(1) of the Act, 21 U.S.C.section  360bbb-3(b)(1), unless the authorization is terminated  or revoked sooner.       Influenza A by PCR NEGATIVE NEGATIVE Final   Influenza B by PCR NEGATIVE NEGATIVE Final    Comment: (NOTE) The Xpert Xpress SARS-CoV-2/FLU/RSV plus assay is intended as an aid in the diagnosis of influenza from Nasopharyngeal swab specimens and should not be used as a sole basis for treatment. Nasal washings and aspirates are unacceptable for Xpert Xpress SARS-CoV-2/FLU/RSV testing.  Fact Sheet for Patients: EntrepreneurPulse.com.au  Fact Sheet for Healthcare Providers: IncredibleEmployment.be  This test is not yet approved or cleared by the [place] FDA and has been authorized for detection and/or diagnosis of SARS-CoV-2 by FDA under an Emergency Use Authorization (EUA). This EUA will remain in  effect (meaning this test can be used) for the duration of the COVID-19 declaration under Section 564(b)(1) of the Act, 21 U.S.C. section 360bbb-3(b)(1), unless the authorization is terminated or revoked.     Resp Syncytial Virus by PCR NEGATIVE NEGATIVE Final    Comment: (NOTE) Fact Sheet for Patients: EntrepreneurPulse.com.au  Fact Sheet for Healthcare Providers: IncredibleEmployment.be  This test is not yet approved or cleared by the [place] FDA and has been authorized for detection and/or diagnosis of SARS-CoV-2 by FDA under an Emergency Use Authorization (EUA). This EUA will remain in effect (meaning this test can be used) for the duration of the COVID-19 declaration under Section 564(b)(1) of the Act, 21 U.S.C. section 360bbb-3(b)(1), unless the authorization is terminated or revoked.  Performed at [facility], [place] [address] [place], [place] [address]          Radiology Studies: CT Head Wo Contrast  Result Date: 10/26/2022 CLINICAL DATA:  Mental status change, unknown cause. Urinary tract infection. EXAM: CT HEAD WITHOUT CONTRAST TECHNIQUE: Contiguous axial images were obtained from the base of the skull through the vertex without intravenous contrast. RADIATION DOSE REDUCTION: This exam was performed according to the departmental dose-optimization program which includes automated exposure control, adjustment of the mA and/or kV according to patient size and/or use of iterative reconstruction technique. COMPARISON:  CT head without contrast 09/27/2021 FINDINGS: Brain: Progressive moderate atrophy and diffuse white matter disease is present. Prominent white matter hypoattenuation is present posterior limb of the internal capsules bilaterally. Remote ischemic changes are again noted in the thalami. Streak artifact is present across the posterior fossa. No discrete lesions are evident. The ventricles are proportionate to the  degree of atrophy. No significant extraaxial fluid collection is present. Vascular: No hyperdense vessel or unexpected calcification. Skull: Calvarium is intact. No focal lytic or blastic lesions are present. No significant extracranial soft tissue lesion is present. Sinuses/Orbits: The paranasal sinuses and mastoid air cells are clear. Fluid levels are present within the sphenoid sinuses bilaterally. The paranasal sinuses and mastoid air cells are otherwise clear. IMPRESSION: 1. Progressive moderate atrophy and diffuse white matter disease. This likely reflects the sequela of chronic microvascular ischemia. 2. Remote ischemic changes in the thalami bilaterally. 3. Fluid levels within the sphenoid sinuses bilaterally. This likely reflects acute sinusitis. Electronically Signed   By: San Morelle M.D.   On: 10/26/2022 17:15   DG Chest Portable 1 View  Result Date: 10/26/2022 CLINICAL DATA:  Altered mental status. EXAM: PORTABLE CHEST 1 VIEW COMPARISON:  One-view chest x-ray 09/27/2021 FINDINGS: Heart is mildly enlarged, stable. Atherosclerotic calcifications are again noted at the aortic arch. Right perihilar patchy airspace opacities are present. Left basilar opacities are also present. The upper lung fields  are clear. Visualized soft tissues and bony thorax are stable. IMPRESSION: Patchy right perihilar and left basilar airspace disease. While this may represent atelectasis, infection is not excluded. Electronically Signed   By: San Morelle M.D.   On: 10/26/2022 16:46        Scheduled Meds:  aspirin EC  81 mg Oral Daily   heparin  5,000 Units Subcutaneous Q8H   levothyroxine  75 mcg Oral Daily   pantoprazole  40 mg Oral Daily   Continuous Infusions:  sodium bicarbonate 150 mEq in sterile water 1,150 mL infusion 100 mL/hr at 10/26/22 2127     LOS: 1 day    Time spent: 35 minutes.    Elmarie Shiley, MD [facility]   If 7PM-7AM, please contact  night-coverage [email or web address]  10/27/2022, 8:07 AM

## 2022-10-28 ENCOUNTER — Inpatient Hospital Stay (HOSPITAL_COMMUNITY): Payer: Medicare Other

## 2022-10-28 DIAGNOSIS — G9340 Encephalopathy, unspecified: Secondary | ICD-10-CM | POA: Diagnosis not present

## 2022-10-28 LAB — CBC
HCT: 26 % — ABNORMAL LOW (ref 36.0–46.0)
Hemoglobin: 8.9 g/dL — ABNORMAL LOW (ref 12.0–15.0)
MCH: 31.7 pg (ref 26.0–34.0)
MCHC: 34.2 g/dL (ref 30.0–36.0)
MCV: 92.5 fL (ref 80.0–100.0)
Platelets: 260 10*3/uL (ref 150–400)
RBC: 2.81 MIL/uL — ABNORMAL LOW (ref 3.87–5.11)
RDW: 13.9 % (ref 11.5–15.5)
WBC: 9.6 10*3/uL (ref 4.0–10.5)
nRBC: 0 % (ref 0.0–0.2)

## 2022-10-28 LAB — GLUCOSE, CAPILLARY
Glucose-Capillary: 104 mg/dL — ABNORMAL HIGH (ref 70–99)
Glucose-Capillary: 117 mg/dL — ABNORMAL HIGH (ref 70–99)
Glucose-Capillary: 117 mg/dL — ABNORMAL HIGH (ref 70–99)
Glucose-Capillary: 136 mg/dL — ABNORMAL HIGH (ref 70–99)
Glucose-Capillary: 136 mg/dL — ABNORMAL HIGH (ref 70–99)
Glucose-Capillary: 139 mg/dL — ABNORMAL HIGH (ref 70–99)
Glucose-Capillary: 150 mg/dL — ABNORMAL HIGH (ref 70–99)

## 2022-10-28 LAB — BASIC METABOLIC PANEL
Anion gap: 9 (ref 5–15)
BUN: 10 mg/dL (ref 8–23)
CO2: 24 mmol/L (ref 22–32)
Calcium: 9.2 mg/dL (ref 8.9–10.3)
Chloride: 106 mmol/L (ref 98–111)
Creatinine, Ser: 1.92 mg/dL — ABNORMAL HIGH (ref 0.44–1.00)
GFR, Estimated: 26 mL/min — ABNORMAL LOW (ref >60)
Glucose, Bld: 148 mg/dL — ABNORMAL HIGH (ref 70–99)
Potassium: 3.1 mmol/L — ABNORMAL LOW (ref 3.5–5.1)
Sodium: 139 mmol/L (ref 135–145)

## 2022-10-28 MED ORDER — ADULT MULTIVITAMIN W/MINERALS CH
1.0000 | ORAL_TABLET | Freq: Every day | ORAL | Status: DC
  Administered 2022-10-29 – 2022-10-30 (×2): 1 via ORAL
  Filled 2022-10-28 (×3): qty 1

## 2022-10-28 MED ORDER — FERROUS SULFATE 325 (65 FE) MG PO TABS
325.0000 mg | ORAL_TABLET | Freq: Every day | ORAL | Status: DC
  Administered 2022-10-29 – 2022-10-31 (×3): 325 mg via ORAL
  Filled 2022-10-28 (×3): qty 1

## 2022-10-28 MED ORDER — ENSURE ENLIVE PO LIQD
237.0000 mL | Freq: Two times a day (BID) | ORAL | Status: DC
  Filled 2022-10-28: qty 237

## 2022-10-28 MED ORDER — LACTATED RINGERS IV BOLUS
500.0000 mL | Freq: Once | INTRAVENOUS | Status: AC
  Administered 2022-10-28: 500 mL via INTRAVENOUS

## 2022-10-28 NOTE — Progress Notes (Signed)
Initial Nutrition Assessment  DOCUMENTATION CODES:   Not applicable  INTERVENTION:  - Add Ensure Enlive po BID, each supplement provides 350 kcal and 20 grams of protein.   - Add MVI q day.   NUTRITION DIAGNOSIS:   Inadequate oral intake related to lethargy/confusion as evidenced by per patient/family report.  GOAL:   Patient will meet greater than or equal to 90% of their needs  MONITOR:   PO intake, Supplement acceptance  REASON FOR ASSESSMENT:   Malnutrition Screening Tool    ASSESSMENT:   81 y.o. female admits related to AMS. PMH includes: memory loss, HTN, DM, HLD, fibromyalgia and memory loss. Pt is currently receiving medical management for acute metabolic encephalopathy.  Meds reviewed. Labs reviewed: K low.   Pt is currently oriented x1. Pt out of room at time of assessment in ultrasound. No significant wt hx per record. Pt needs updated weight. Per H&P, pt stopped eating on 10/24/22 due to hallucinations. RD will add Ensure BID. Will continue to monitor PO intakes.   NUTRITION - FOCUSED PHYSICAL EXAM:  Unable to assess due to out of room and pt with hallucinations.  Diet Order:   Diet Order             Diet regular Room service appropriate? Yes; Fluid consistency: Thin  Diet effective now                   EDUCATION NEEDS:   Education needs have been addressed  Skin:  Skin Assessment: Reviewed RN Assessment  Last BM:  unknown  Height:   Ht Readings from Last 1 Encounters:  09/27/21 5' (1.524 m)    Weight:   Wt Readings from Last 1 Encounters:  09/27/21 65.8 kg    Ideal Body Weight:     BMI:  There is no height or weight on file to calculate BMI.  Estimated Nutritional Needs:   Kcal:  6812-7517 kcals  Protein:  80-100 gm  Fluid:  >/= 1.6 L  Thalia Bloodgood, RD, LDN, CNSC

## 2022-10-28 NOTE — Evaluation (Signed)
Occupational Therapy Evaluation Patient Details Name: Cindy Gordon MRN: [id number] DOB: July 09, 1941 Today's Date: 10/28/2022   History of Present Illness 81 year old admitted 12/17 presents from skilled nursing facility for evaluation of altered mental status.  Patient has been undergoing treatment with Invanz for almost a week at her skilled nursing facility for UTI when she developed hallucination and confusion and stopped eating on 10/24/2022.  Found to have AKI and encephalopathy. PMH:   memory loss, hypertension, diet-controlled diabetes, hyperlipidemia, fibromyalgia   Clinical Impression   Pt in bed upon therapy arrival. Pt with very limited ability to participate in OT evaluation due to cognition and hallucinations. Unable to follow one step commands when provided with mulitmodel cueing. PT spoke with [facility] after evaluation was completed. [facility] reported that patient is a total assist for ADL tasks and uses a hoyer lift. At this time, pt is presenting at her baseline and no acute OT needs are identified; will sign off.       Recommendations for follow up therapy are one component of a multi-disciplinary discharge planning process, led by the attending physician.  Recommendations may be updated based on patient status, additional functional criteria and insurance authorization.   Follow Up Recommendations  No OT follow up     Assistance Recommended at Discharge Frequent or constant Supervision/Assistance  Patient can return home with the following Two people to help with walking and/or transfers;A lot of help with bathing/dressing/bathroom;Assistance with feeding    Functional Status Assessment  Patient has not had a recent decline in their functional status  Equipment Recommendations  None recommended by OT       Precautions / Restrictions Precautions Precautions: Fall Restrictions Weight Bearing Restrictions: No      Mobility Bed Mobility Overal bed  mobility: Needs Assistance Bed Mobility: Supine to Sit, Sit to Supine     Supine to sit: Max assist, +2 for physical assistance, HOB elevated, Mod assist Sit to supine: Total assist, +2 for physical assistance   General bed mobility comments: Pt needed assist for LEs and elevation of trunk. Patient Response: Impulsive, Restless  Transfers Overall transfer level:  (Not safe to assess)        Balance Overall balance assessment: Needs assistance Sitting-balance support: No upper extremity supported, Feet supported Sitting balance-Leahy Scale: Zero Sitting balance - Comments: Pt leaning heavily to left and posterior for most of sitting. Pt needing min to total assist to sit EOB. Pt resists if you touch her as well. Postural control: Left lateral lean, Posterior lean   Standing balance-Leahy Scale:  (Not tested)         ADL either performed or assessed with clinical judgement   ADL Overall ADL's : At baseline       General ADL Comments: Patient requires total assist for all BADL tasks at bed level     Vision Baseline Vision/History:  (unknown) Ability to See in Adequate Light:  (unknown) Patient Visual Report:  (Unable to report) Vision Assessment?:  (Unable to assess)            Pertinent Vitals/Pain Pain Assessment Pain Assessment: Faces Faces Pain Scale: Hurts whole lot Pain Location: generalized with any touch and mobility Pain Descriptors / Indicators: Grimacing, Guarding, Aching Pain Intervention(s): Limited activity within patient's tolerance, Monitored during session, Repositioned     Hand Dominance Right   Extremity/Trunk Assessment Upper Extremity Assessment Upper Extremity Assessment: Difficult to assess due to impaired cognition;Generalized weakness (unable to follow any 1 step commands verbally or  with visual demonstration. Baseline LUE weakness due to hx of CVA)   Lower Extremity Assessment Lower Extremity Assessment: Defer to PT evaluation    Cervical / Trunk Assessment Cervical / Trunk Assessment: Kyphotic   Communication Communication Communication: Expressive difficulties   Cognition Arousal/Alertness: Awake/alert Behavior During Therapy: Restless, Impulsive Overall Cognitive Status: History of cognitive impairments - at baseline       General Comments: Hallucinating "phyllis and Brian"                Home Living Family/patient expects to be discharged to:: Skilled nursing facility       Additional Comments: After evaluation, Called facility and pt was total lift and total care      Prior Functioning/Environment    [facility]   Mobility Comments: Total care with hoyer lift ADLs Comments: Total care        OT Problem List: Decreased strength      OT Treatment/Interventions:   Eval only   OT Goals(Current goals can be found in the care plan section) Acute Rehab OT Goals Patient Stated Goal: Pt requested water  OT Frequency:  1 time visit    Co-evaluation PT/OT/SLP Co-Evaluation/Treatment: Yes Reason for Co-Treatment: Necessary to address cognition/behavior during functional activity;Complexity of the patient's impairments (multi-system involvement);For patient/therapist safety;To address functional/ADL transfers PT goals addressed during session: Mobility/safety with mobility OT goals addressed during session: ADL's and self-care;Proper use of Adaptive equipment and DME;Strengthening/ROM      AM-PAC OT "6 Clicks" Daily Activity     Outcome Measure Help from another person eating meals?: Total Help from another person taking care of personal grooming?: Total Help from another person toileting, which includes using toliet, bedpan, or urinal?: Total Help from another person bathing (including washing, rinsing, drying)?: Total Help from another person to put on and taking off regular upper body clothing?: Total Help from another person to put on and taking off regular lower body  clothing?: Total 6 Click Score: 6   End of Session Nurse Communication: Mobility status  Activity Tolerance: Other (comment) (treatment limited secondary to cognition) Patient left: in bed;with call bell/phone within reach;with bed alarm set  OT Visit Diagnosis: Muscle weakness (generalized) (M62.81)                Time: 7867-5449 OT Time Calculation (min): 18 min Charges:  OT General Charges $OT Visit: 1 Visit OT Evaluation $OT Eval Moderate Complexity: 1 Mod  [employer], OTR/L,CBIS  Supplemental OT - [facility] and [facility]   Quanisha Drewry, Clarene Duke 10/28/2022, 12:24 PM

## 2022-10-28 NOTE — Progress Notes (Addendum)
PROGRESS NOTE    Cindy Gordon  UYQ:[id number] DOB: 07-Sep-1941 DOA: 10/26/2022 PCP: Patient, No Pcp Per   Brief Narrative: 81 year old with past medical history significant for memory loss, hypertension, diet-controlled diabetes, hyperlipidemia, fibromyalgia, who presents from skilled nursing facility for evaluation of altered mental status.  Patient has been undergoing treatment with Invanz for almost a week at her skilled nursing facility for UTI when she developed hallucination and confusion and stopped eating on 10/24/2022.  She was not able to recognize her son.   Evaluation in the ED.  CT head with progressive atrophy and diffuse white matter disease.  Chest x-ray with perihilar basilar airspace disease.  Presented with a creatinine of 2.2  Admitted for AKI and acute encephalopathy and further evaluation.   Assessment & Plan:   Principal Problem:   Acute encephalopathy Active Problems:   Essential hypertension, benign   Controlled type 2 diabetes mellitus with diabetic polyneuropathy, without long-term current use of insulin (HCC)   AKI (acute kidney injury) ([facility])   Metabolic acidosis   1-Acute metabolic encephalopathy: CT Head: Progressive moderate atrophy and diffuse white matter disease. This likely reflects the sequela of chronic microvascular ischemia. Remote ischemic changes in the thalami bilaterally. -UA: 21-50 -B12: 712.---Ammonia 11.  -Could be related to AKI, dehydration, Invaz.  -MRI brain negative for acute stroke.  -Delirium precaution.  WiIll also check EEG.  Per son this is not patient baseline.    2-AKI: -Patient presented with a creatinine of 2.2, prior creatinine per records 1.11 September 2021.  Unclear recent baseline. -Likely hypovolemia, prerenal in the setting of poor oral intake. -Continue with IV fluids -Cr 1.8--1.9 -Renal US; Bilateral renal cortical thinning and increased echogenicity, consistent medical renal disease. 2.4 cm irregular mass  lesion along the left bladder wall, concerning for neoplasm. Correlation with urine cytology and cystoscopy is recommended. -Increase cr today. Will give extra fluids. Renal US results as above.   Bladder Mass:  Will need cystoscopy out patient.   Hypothyroidism: -Continue with Synthroid  Hypertension: -SBP elevated.  -Continue with Norvasc.  -PRN labetalol.   Hypokalemia;  -Replete orally.   Anemia: Iron deficiency.  -B 12: 712, Ferritin 638, folic acid 40, iron 20 Will start Iron supplement.   Pyuria;  -Urine culture. Pending  -Recently treated with Invaz for UTI.   Hypoglycemia;  Continue with  IV fluid to D 5 LR.    Pressure Injury 06/21/21 Coccyx Mid Stage 2 -  Partial thickness loss of dermis presenting as a shallow open injury with a red, pink wound bed without slough. (Active)  06/21/21 2047  Location: Coccyx  Location Orientation: Mid  Staging: Stage 2 -  Partial thickness loss of dermis presenting as a shallow open injury with a red, pink wound bed without slough.  Wound Description (Comments):   Present on Admission: Yes     Estimated body mass index is 28.32 kg/m as calculated from the following:   Height as of 09/27/21: 5' (1.524 m).   Weight as of 09/27/21: 65.8 kg.   DVT prophylaxis: Heparin  Code Status: DNR Family Communication: Son over phone 12/19.  Disposition Plan:  Status is: Inpatient Remains inpatient appropriate because: management encephalopathy     Consultants:  None  Procedures:    Antimicrobials:    Subjective: She is alert, confuse.   Objective: Vitals:   10/27/22 1910 10/28/22 0039 10/28/22 0426 10/28/22 0735  BP: (!) 148/72 (!) 186/68 (!) 131/52 (!) 144/90  Pulse: 90 (!) 108 96 97  Resp: '16 16 16   '$ Temp: 98.3 F (36.8 C) 98.7 F (37.1 C) 97.7 F (36.5 C) 99.1 F (37.3 C)  TempSrc: Oral  Axillary Oral  SpO2: 100% 95% 98% 99%    Intake/Output Summary (Last 24 hours) at 10/28/2022 1417 Last data filed at  10/28/2022 1300 Gross per 24 hour  Intake 578.32 ml  Output 800 ml  Net -221.68 ml   There were no vitals filed for this visit.  Examination:  General exam: NAD Respiratory system: CTA Cardiovascular system: S 1, S 2 RRR Gastrointestinal system: BS present, soft, NT Central nervous system: Alert.  Extremities: No edema.    Data Reviewed: I have personally reviewed following labs and imaging studies  CBC: Recent Labs  Lab 10/26/22 1634 10/27/22 0430 10/28/22 0318  WBC 9.2 7.8 9.6  NEUTROABS 6.1  --   --   HGB 10.1* 8.9* 8.9*  HCT 32.5* 28.1* 26.0*  MCV 100.0 97.2 92.5  PLT 324 276 326    Basic Metabolic Panel: Recent Labs  Lab 10/26/22 1634 10/26/22 2124 10/27/22 0430 10/28/22 0318  NA 140  --  140 139  K 4.3  --  3.1* 3.1*  CL 110  --  101 106  CO2 18*  --  29 24  GLUCOSE 81  --  66* 148*  BUN 16  --  11 10  CREATININE 2.22*  --  1.85* 1.92*  CALCIUM 10.2  --  8.4* 9.2  MG  --  1.9  --   --   PHOS  --  3.8  --   --     GFR: CrCl cannot be calculated (Unknown ideal weight.). Liver Function Tests: Recent Labs  Lab 10/26/22 1634  AST 24  ALT 12  ALKPHOS 65  BILITOT 0.8  PROT 6.7  ALBUMIN 2.8*    No results for input(s): "LIPASE", "AMYLASE" in the last 168 hours. Recent Labs  Lab 10/26/22 2124  AMMONIA 11    Coagulation Profile: No results for input(s): "INR", "PROTIME" in the last 168 hours. Cardiac Enzymes: No results for input(s): "CKTOTAL", "CKMB", "CKMBINDEX", "TROPONINI" in the last 168 hours. BNP (last 3 results) No results for input(s): "PROBNP" in the last 8760 hours. HbA1C: No results for input(s): "HGBA1C" in the last 72 hours. CBG: Recent Labs  Lab 10/27/22 2019 10/28/22 0041 10/28/22 0349 10/28/22 0740 10/28/22 1247  GLUCAP 118* 136* 150* 139* 117*    Lipid Profile: No results for input(s): "CHOL", "HDL", "LDLCALC", "TRIG", "CHOLHDL", "LDLDIRECT" in the last 72 hours. Thyroid Function Tests: Recent Labs     10/26/22 2124  TSH 0.702    Anemia Panel: Recent Labs    10/26/22 2124 10/27/22 0430  VITAMINB12 712  --   FOLATE  --  >40.0  FERRITIN  --  326*  TIBC  --  176*  IRON  --  20*  RETICCTPCT  --  1.1    Sepsis Labs: Recent Labs  Lab 10/26/22 1635  LATICACIDVEN 1.2     Recent Results (from the past 240 hour(s))  Resp panel by RT-PCR (RSV, Flu A&B, Covid) Anterior Nasal Swab     Status: None   Collection Time: 10/26/22  5:32 PM   Specimen: Anterior Nasal Swab  Result Value Ref Range Status   SARS Coronavirus 2 by RT PCR NEGATIVE NEGATIVE Final    Comment: (NOTE) SARS-CoV-2 target nucleic acids are NOT DETECTED.  The SARS-CoV-2 RNA is generally detectable in upper respiratory specimens during the acute phase of  infection. The lowest concentration of SARS-CoV-2 viral copies this assay can detect is 138 copies/mL. A negative result does not preclude SARS-Cov-2 infection and should not be used as the sole basis for treatment or other patient management decisions. A negative result may occur with  improper specimen collection/handling, submission of specimen other than nasopharyngeal swab, presence of viral mutation(s) within the areas targeted by this assay, and inadequate number of viral copies(<138 copies/mL). A negative result must be combined with clinical observations, patient history, and epidemiological information. The expected result is Negative.  Fact Sheet for Patients:  EntrepreneurPulse.com.au  Fact Sheet for Healthcare Providers:  IncredibleEmployment.be  This test is no t yet approved or cleared by the [place] FDA and  has been authorized for detection and/or diagnosis of SARS-CoV-2 by FDA under an Emergency Use Authorization (EUA). This EUA will remain  in effect (meaning this test can be used) for the duration of the COVID-19 declaration under Section 564(b)(1) of the Act, 21 U.S.C.section 360bbb-3(b)(1),  unless the authorization is terminated  or revoked sooner.       Influenza A by PCR NEGATIVE NEGATIVE Final   Influenza B by PCR NEGATIVE NEGATIVE Final    Comment: (NOTE) The Xpert Xpress SARS-CoV-2/FLU/RSV plus assay is intended as an aid in the diagnosis of influenza from Nasopharyngeal swab specimens and should not be used as a sole basis for treatment. Nasal washings and aspirates are unacceptable for Xpert Xpress SARS-CoV-2/FLU/RSV testing.  Fact Sheet for Patients: EntrepreneurPulse.com.au  Fact Sheet for Healthcare Providers: IncredibleEmployment.be  This test is not yet approved or cleared by the [place] FDA and has been authorized for detection and/or diagnosis of SARS-CoV-2 by FDA under an Emergency Use Authorization (EUA). This EUA will remain in effect (meaning this test can be used) for the duration of the COVID-19 declaration under Section 564(b)(1) of the Act, 21 U.S.C. section 360bbb-3(b)(1), unless the authorization is terminated or revoked.     Resp Syncytial Virus by PCR NEGATIVE NEGATIVE Final    Comment: (NOTE) Fact Sheet for Patients: EntrepreneurPulse.com.au  Fact Sheet for Healthcare Providers: IncredibleEmployment.be  This test is not yet approved or cleared by the [place] FDA and has been authorized for detection and/or diagnosis of SARS-CoV-2 by FDA under an Emergency Use Authorization (EUA). This EUA will remain in effect (meaning this test can be used) for the duration of the COVID-19 declaration under Section 564(b)(1) of the Act, 21 U.S.C. section 360bbb-3(b)(1), unless the authorization is terminated or revoked.  Performed at [facility], [place] [address] [place], [place] [address]   MRSA Next Gen by PCR, Nasal     Status: Abnormal   Collection Time: 10/27/22  1:29 PM   Specimen: Nasal Mucosa; Nasal Swab  Result Value Ref Range Status   MRSA by  PCR Next Gen DETECTED (A) NOT DETECTED Final    Comment: RESULT CALLED TO, READ BACK BY AND VERIFIED WITH:  C/ VALERIE O., RN 10/27/22 1728 A. LAFRANCE (NOTE) The GeneXpert MRSA Assay (FDA approved for NASAL specimens only), is one component of a comprehensive MRSA colonization surveillance program. It is not intended to diagnose MRSA infection nor to guide or monitor treatment for MRSA infections. Test performance is not FDA approved in patients less than 37 years old. Performed at [facility], [place] [address], [place], [place] [address]          Radiology Studies: MR BRAIN WO CONTRAST  Result Date: 10/28/2022 CLINICAL DATA:  Encephalopathy.  Altered mental status EXAM: MRI HEAD WITHOUT CONTRAST TECHNIQUE: Multiplanar, multiecho pulse sequences of the brain and surrounding structures were obtained without intravenous contrast. COMPARISON:  CT head 10/26/2022 FINDINGS: Brain: Negative for acute infarct.  Negative for hemorrhage or mass. Motion degraded study Generalized atrophy. Mild to moderate white matter changes with periventricular deep white matter hyperintensity on T2 bilaterally. Brainstem and cerebellum intact Vascular: Normal arterial flow voids Skull and upper cervical spine: No focal skeletal lesion Sinuses/Orbits: Air-fluid levels in the sphenoid sinus. Remaining sinuses clear. Right cataract extraction Other: None IMPRESSION: 1. Negative for acute infarct. 2. Atrophy and mild to moderate chronic microvascular ischemic change in the white matter. 3. Air-fluid levels in the sphenoid sinus. Correlate with symptoms of sinusitis. Electronically Signed   By: Franchot Gallo M.D.   On: 10/28/2022 13:16   US RENAL  Result Date: 10/28/2022 CLINICAL DATA:  Acute kidney injury. EXAM: RENAL / URINARY TRACT ULTRASOUND COMPLETE COMPARISON:  CT abdomen pelvis dated June 21, 2021. FINDINGS: Right Kidney: Renal measurements: 8.0 x 4.3 x 4.1 cm = volume: 75 mL. Cortical thinning and  increased echogenicity. No mass or hydronephrosis visualized. Left Kidney: Renal measurements: 10.3 x 5.1 x 4.4 cm = volume: 119 mL. Cortical thinning and increased echogenicity. No mass or hydronephrosis visualized. Bladder: 2.4 x 1.8 x 1.4 cm irregular lesion along the left bladder wall. Other: None. IMPRESSION: 1. Bilateral renal cortical thinning and increased echogenicity, consistent medical renal disease. 2. 2.4 cm irregular mass lesion along the left bladder wall, concerning for neoplasm. Correlation with urine cytology and cystoscopy is recommended. Electronically Signed   By: Titus Dubin M.D.   On: 10/28/2022 10:31   DG Abd 1 View  Result Date: 10/28/2022 CLINICAL DATA:  Abdominal pain. EXAM: ABDOMEN - 1 VIEW COMPARISON:  06/29/2021. FINDINGS: The bowel gas pattern is normal. Multiple surgical clips are present in the right upper quadrant and mid right abdomen. No renal calculi. Degenerative changes are noted in the lumbar spine. IMPRESSION: No evidence of bowel obstruction. Electronically Signed   By: Brett Fairy M.D.   On: 10/28/2022 00:22   CT Head Wo Contrast  Result Date: 10/26/2022 CLINICAL DATA:  Mental status change, unknown cause. Urinary tract infection. EXAM: CT HEAD WITHOUT CONTRAST TECHNIQUE: Contiguous axial images were obtained from the base of the skull through the vertex without intravenous contrast. RADIATION DOSE REDUCTION: This exam was performed according to the departmental dose-optimization program which includes automated exposure control, adjustment of the mA and/or kV according to patient size and/or use of iterative reconstruction technique. COMPARISON:  CT head without contrast 09/27/2021 FINDINGS: Brain: Progressive moderate atrophy and diffuse white matter disease is present. Prominent white matter hypoattenuation is present posterior limb of the internal capsules bilaterally. Remote ischemic changes are again noted in the thalami. Streak artifact is present  across the posterior fossa. No discrete lesions are evident. The ventricles are proportionate to the degree of atrophy. No significant extraaxial fluid collection is present. Vascular: No hyperdense vessel or unexpected calcification. Skull: Calvarium is intact. No focal lytic or blastic lesions are present. No significant extracranial soft tissue lesion is present. Sinuses/Orbits: The paranasal sinuses and mastoid air cells are clear. Fluid levels are present within the sphenoid sinuses bilaterally. The paranasal sinuses and mastoid air cells are otherwise clear. IMPRESSION: 1. Progressive moderate atrophy and diffuse white matter disease. This likely reflects the sequela of chronic microvascular ischemia. 2. Remote ischemic changes in the thalami bilaterally. 3. Fluid levels within the sphenoid sinuses bilaterally.  This likely reflects acute sinusitis. Electronically Signed   By: San Morelle M.D.   On: 10/26/2022 17:15   DG Chest Portable 1 View  Result Date: 10/26/2022 CLINICAL DATA:  Altered mental status. EXAM: PORTABLE CHEST 1 VIEW COMPARISON:  One-view chest x-ray 09/27/2021 FINDINGS: Heart is mildly enlarged, stable. Atherosclerotic calcifications are again noted at the aortic arch. Right perihilar patchy airspace opacities are present. Left basilar opacities are also present. The upper lung fields are clear. Visualized soft tissues and bony thorax are stable. IMPRESSION: Patchy right perihilar and left basilar airspace disease. While this may represent atelectasis, infection is not excluded. Electronically Signed   By: San Morelle M.D.   On: 10/26/2022 16:46        Scheduled Meds:  amLODipine  2.5 mg Oral Daily   aspirin EC  81 mg Oral Daily   feeding supplement  237 mL Oral BID BM   heparin  5,000 Units Subcutaneous Q8H   levothyroxine  75 mcg Oral Daily   multivitamin with minerals  1 tablet Oral Daily   pantoprazole  40 mg Oral Daily   Continuous Infusions:   dextrose 5% lactated ringers 100 mL/hr at 10/28/22 0453     LOS: 2 days    Time spent: 35 minutes.    Elmarie Shiley, MD [facility]   If 7PM-7AM, please contact night-coverage [email or web address]  10/28/2022, 2:17 PM

## 2022-10-28 NOTE — Evaluation (Signed)
Physical Therapy Evaluation and D/C Patient Details Name: Cindy Gordon MRN: [id number] DOB: Jun 17, 1941 Today's Date: 10/28/2022  History of Present Illness  81 year old admitted 12/17 presents from skilled nursing facility for evaluation of altered mental status.  Patient has been undergoing treatment with Invanz for almost a week at her skilled nursing facility for UTI when she developed hallucination and confusion and stopped eating on 10/24/2022.  Found to have AKI and encephalopathy. PMH:   memory loss, hypertension, diet-controlled diabetes, hyperlipidemia, fibromyalgia  Clinical Impression  Pt admitted with above diagnosis. Pt mostly unable to participate with PT as she is hallucinating and confused.  Was able to move pt to EOB however pt fatigued quickly and needing up to total assist to sit eOB. Called [facility] where pt was PTA and they state pt was a total lift with hoyer lift and total care for months now.  Will not follow as feel that pt will not  benefit from skilled PT.  Sign off.    Recommendations for follow up therapy are one component of a multi-disciplinary discharge planning process, led by the attending physician.  Recommendations may be updated based on patient status, additional functional criteria and insurance authorization.  Follow Up Recommendations Long-term institutional care without follow-up therapy Can patient physically be transported by private vehicle: No    Assistance Recommended at Discharge Frequent or constant Supervision/Assistance  Patient can return home with the following  Two people to help with walking and/or transfers;Two people to help with bathing/dressing/bathroom;Assistance with cooking/housework;Assist for transportation;Help with stairs or ramp for entrance;Assistance with feeding;Direct supervision/assist for medications management;Direct supervision/assist for financial management    Equipment Recommendations None recommended by  PT  Recommendations for Other Services       Functional Status Assessment Patient has not had a recent decline in their functional status (Lift used PTA at SNF)     Precautions / Restrictions Precautions Precautions: Fall Restrictions Weight Bearing Restrictions: No      Mobility  Bed Mobility Overal bed mobility: Needs Assistance Bed Mobility: Supine to Sit     Supine to sit: Max assist, +2 for physical assistance, HOB elevated, Mod assist     General bed mobility comments: Pt needed assist for LEs and elevation of trunk.    Transfers                   General transfer comment: NT    Ambulation/Gait                  Stairs            Wheelchair Mobility    Modified Rankin (Stroke Patients Only)       Balance Overall balance assessment: Needs assistance Sitting-balance support: No upper extremity supported, Feet supported Sitting balance-Leahy Scale: Poor Sitting balance - Comments: Pt leaning heavily to left and posterior for most of sitting. Pt needing min to total assist to sit EOB. Pt resists if you touch her as well. Postural control: Left lateral lean, Posterior lean                                   Pertinent Vitals/Pain Pain Assessment Pain Assessment: Faces Faces Pain Scale: Hurts whole lot Breathing: normal Negative Vocalization: none Facial Expression: smiling or inexpressive Body Language: relaxed Consolability: no need to console PAINAD Score: 0 Pain Location: generalized Pain Descriptors / Indicators: Grimacing, Guarding, Aching Pain Intervention(s): Limited  activity within patient's tolerance, Monitored during session, Repositioned    Home Living Family/patient expects to be discharged to:: Skilled nursing facility                   Additional Comments: After evaluation, Called facility and pt was total lift and total care    Prior Function               Mobility Comments: Total care  with hoyer lift ADLs Comments: Total care     Hand Dominance   Dominant Hand: Right    Extremity/Trunk Assessment   Upper Extremity Assessment Upper Extremity Assessment: Defer to OT evaluation    Lower Extremity Assessment Lower Extremity Assessment: Generalized weakness    Cervical / Trunk Assessment Cervical / Trunk Assessment: Kyphotic  Communication   Communication: Expressive difficulties  Cognition Arousal/Alertness: Awake/alert Behavior During Therapy: Restless, Impulsive Overall Cognitive Status: History of cognitive impairments - at baseline                                 General Comments: Hallucinating "phyllis and Aaron Edelman"        General Comments      Exercises     Assessment/Plan    PT Assessment Patient does not need any further PT services  PT Problem List         PT Treatment Interventions      PT Goals (Current goals can be found in the Care Plan section)  Acute Rehab PT Goals Patient Stated Goal: unable to state PT Goal Formulation: All assessment and education complete, DC therapy    Frequency       Co-evaluation PT/OT/SLP Co-Evaluation/Treatment: Yes Reason for Co-Treatment: Complexity of the patient's impairments (multi-system involvement);For patient/therapist safety PT goals addressed during session: Mobility/safety with mobility         AM-PAC PT "6 Clicks" Mobility  Outcome Measure Help needed turning from your back to your side while in a flat bed without using bedrails?: Total Help needed moving from lying on your back to sitting on the side of a flat bed without using bedrails?: Total Help needed moving to and from a bed to a chair (including a wheelchair)?: Total Help needed standing up from a chair using your arms (e.g., wheelchair or bedside chair)?: Total Help needed to walk in hospital room?: Total Help needed climbing 3-5 steps with a railing? : Total 6 Click Score: 6    End of Session   Activity  Tolerance: Patient limited by fatigue;Patient limited by pain Patient left: in bed;with call bell/phone within reach;with bed alarm set Nurse Communication: Mobility status;Need for lift equipment PT Visit Diagnosis: Muscle weakness (generalized) (M62.81);Pain Pain - part of body:  (generalized)    Time: 0950-1007 PT Time Calculation (min) (ACUTE ONLY): 17 min   Charges:   PT Evaluation $PT Eval Low Complexity: 1 Low          Damyan Corne M,PT Acute Rehab Services [phone]   Alvira Philips 10/28/2022, 12:09 PM

## 2022-10-29 ENCOUNTER — Inpatient Hospital Stay (HOSPITAL_COMMUNITY): Payer: Medicare Other

## 2022-10-29 DIAGNOSIS — R4182 Altered mental status, unspecified: Secondary | ICD-10-CM

## 2022-10-29 DIAGNOSIS — G9340 Encephalopathy, unspecified: Secondary | ICD-10-CM | POA: Diagnosis not present

## 2022-10-29 LAB — CBC
HCT: 30.7 % — ABNORMAL LOW (ref 36.0–46.0)
Hemoglobin: 10.1 g/dL — ABNORMAL LOW (ref 12.0–15.0)
MCH: 31.3 pg (ref 26.0–34.0)
MCHC: 32.9 g/dL (ref 30.0–36.0)
MCV: 95 fL (ref 80.0–100.0)
Platelets: 278 10*3/uL (ref 150–400)
RBC: 3.23 MIL/uL — ABNORMAL LOW (ref 3.87–5.11)
RDW: 13.9 % (ref 11.5–15.5)
WBC: 9.3 10*3/uL (ref 4.0–10.5)
nRBC: 0 % (ref 0.0–0.2)

## 2022-10-29 LAB — BASIC METABOLIC PANEL
Anion gap: 10 (ref 5–15)
BUN: 6 mg/dL — ABNORMAL LOW (ref 8–23)
CO2: 26 mmol/L (ref 22–32)
Calcium: 9.7 mg/dL (ref 8.9–10.3)
Chloride: 108 mmol/L (ref 98–111)
Creatinine, Ser: 1.69 mg/dL — ABNORMAL HIGH (ref 0.44–1.00)
GFR, Estimated: 30 mL/min — ABNORMAL LOW (ref >60)
Glucose, Bld: 147 mg/dL — ABNORMAL HIGH (ref 70–99)
Potassium: 2.8 mmol/L — ABNORMAL LOW (ref 3.5–5.1)
Sodium: 144 mmol/L (ref 135–145)

## 2022-10-29 LAB — GLUCOSE, CAPILLARY
Glucose-Capillary: 126 mg/dL — ABNORMAL HIGH (ref 70–99)
Glucose-Capillary: 145 mg/dL — ABNORMAL HIGH (ref 70–99)
Glucose-Capillary: 136 mg/dL — ABNORMAL HIGH (ref 70–99)
Glucose-Capillary: 141 mg/dL — ABNORMAL HIGH (ref 70–99)

## 2022-10-29 LAB — URINE CULTURE: Culture: NO GROWTH

## 2022-10-29 MED ORDER — LACTATED RINGERS IV SOLN: INTRAVENOUS | Status: DC

## 2022-10-29 MED ORDER — CHLORHEXIDINE GLUCONATE CLOTH 2 % EX PADS
6.0000 | MEDICATED_PAD | Freq: Every day | CUTANEOUS | Status: DC
  Administered 2022-10-30 – 2022-10-31 (×2): 6 via TOPICAL

## 2022-10-29 MED ORDER — AMLODIPINE BESYLATE 5 MG PO TABS
5.0000 mg | ORAL_TABLET | Freq: Every day | ORAL | Status: DC
  Administered 2022-10-29 – 2022-10-30 (×2): 5 mg via ORAL
  Filled 2022-10-29 (×3): qty 1

## 2022-10-29 MED ORDER — MUPIROCIN 2 % EX OINT
1.0000 | TOPICAL_OINTMENT | Freq: Two times a day (BID) | CUTANEOUS | Status: DC
  Administered 2022-10-29 – 2022-10-30 (×3): 1 via NASAL
  Filled 2022-10-29: qty 22

## 2022-10-29 MED ORDER — POTASSIUM CHLORIDE CRYS ER 20 MEQ PO TBCR
40.0000 meq | EXTENDED_RELEASE_TABLET | ORAL | Status: AC
  Administered 2022-10-29 (×3): 40 meq via ORAL
  Filled 2022-10-29 (×3): qty 2

## 2022-10-29 NOTE — TOC Initial Note (Signed)
Transition of Care [facility]) - Initial/Assessment Note    Patient Details  Name: Cindy Gordon MRN: [id number] Date of Birth: 08-17-1941  Transition of Care [facility]) CM/SW Contact:    Curlene Labrum, RN Phone Number: 10/29/2022, 3:44 PM  Clinical Narrative:                 CM was transferred to [facility] hospital from [facility].  I called and spoke with Cyndia Skeeters, CM at facility and the facility is holding a LTC bed for the patient to return once she is medically stable.  CM and MSW will continue to follow the patient for TOC needs.  Harden CM at [employer] will be out of town - CM will need to contact Marko Stai, CM at the facility when patient is ready to return.  Planned Disposition: Skilled Nursing Facility Barriers to Discharge: Continued Medical Work up   Patient Goals and CMS Choice            Expected Discharge Plan and Services Planned Disposition: [place] Acute Care Choice: Resumption of Svcs/PTA Provider Living arrangements for the past 2 months: [place]                                      Prior Living Arrangements/Services Living arrangements for the past 2 months: [place] Lives with:: Facility Resident Patient language and need for interpreter reviewed:: Yes Do you feel safe going back to the place where you live?: Yes      Need for Family Participation in Patient Care: Yes (Comment) Care giver support system in place?: Yes (comment)   Criminal Activity/Legal Involvement Pertinent to Current Situation/Hospitalization: No - Comment as needed  Activities of Daily Living Home Assistive Devices/Equipment: Wheelchair ADL Screening (condition at time of admission) Patient's cognitive ability adequate to safely complete daily activities?: No Is the patient deaf or have difficulty hearing?: No Does the patient have difficulty seeing, even when  wearing glasses/contacts?: No Does the patient have difficulty concentrating, remembering, or making decisions?: Yes Patient able to express need for assistance with ADLs?: Yes Does the patient have difficulty dressing or bathing?: Yes Independently performs ADLs?: No Does the patient have difficulty walking or climbing stairs?: Yes Weakness of Legs: Both Weakness of Arms/Hands: Both  Permission Sought/Granted Permission sought to share information with : Case Manager, Family Supports, Investment banker, corporate granted to share info w AGENCY: [facility] facility in [place] - Room 802 A        Emotional Assessment           Psych Involvement: No (comment)  Admission diagnosis:  Dehydration [E86.0] Acute encephalopathy [G93.40] AKI (acute kidney injury) ([place]) [N17.9] Altered mental status, unspecified altered mental status type [R41.82] Patient Active Problem List   Diagnosis Date Noted   Acute encephalopathy 10/26/2022   AKI (acute kidney injury) ([place]) 42/68/3419   Metabolic acidosis 62/22/9798   Hematuria 06/21/2021   ABLA (acute blood loss anemia) 06/21/2021   Pressure injury of skin 06/21/2021   Urge incontinence 07/23/2020   Mixed hyperlipidemia 03/15/2020   Controlled type 2 diabetes mellitus with diabetic polyneuropathy, without long-term current use of insulin ([place]) 03/15/2020   Atrophy of thyroid 03/15/2020   Fibromyalgia 03/15/2020   Gastroesophageal reflux disease without esophagitis 03/15/2020  OSA (obstructive sleep apnea) 03/15/2020   Class 1 obesity due to excess calories with serious comorbidity and body mass index (BMI) of 32.0 to 32.9 in adult 03/15/2020   Essential hypertension, benign 01/23/2020   Acute cystitis without hematuria 01/23/2020   Vertigo 01/23/2020   Fall on same level 01/23/2020   Abrasion of right arm 01/23/2020   PCP:  Patient, No Pcp Per Pharmacy:  No Pharmacies Listed    Social Determinants of  Health (SDOH) Social History: [place]: No Food Insecurity (10/27/2022)  Housing: Low Risk  (10/27/2022)  Transportation Needs: No Transportation Needs (10/27/2022)  Utilities: Not At Risk (10/27/2022)  Depression (PHQ2-9): Low Risk  (11/05/2020)  Tobacco Use: Low Risk  (10/27/2022)   SDOH Interventions:     Readmission Risk Interventions     No data to display

## 2022-10-29 NOTE — Progress Notes (Signed)
EEG complete - results pending 

## 2022-10-29 NOTE — Progress Notes (Signed)
PROGRESS NOTE    Cindy Gordon  SEG:[id number] DOB: 19-Sep-1941 DOA: 10/26/2022 PCP: Patient, No Pcp Per   Brief Narrative:  81 year old with past medical history significant for memory loss, hypertension, diet-controlled diabetes, hyperlipidemia, fibromyalgia, who presents from skilled nursing facility for evaluation of altered mental status.  Patient has been undergoing treatment with Invanz for almost a week at her skilled nursing facility for UTI when she developed hallucination and confusion and stopped eating on 10/24/2022.  She was not able to recognize her son.    Evaluation in the ED.  CT head with progressive atrophy and diffuse white matter disease.  Chest x-ray with perihilar basilar airspace disease.  Presented with a creatinine of 2.2   Admitted for AKI and acute encephalopathy and further evaluation.  Assessment & Plan:   Principal Problem:   Acute encephalopathy Active Problems:   Essential hypertension, benign   Controlled type 2 diabetes mellitus with diabetic polyneuropathy, without long-term current use of insulin (H[facility])   AKI (acute kidney injury) ([place])   Metabolic acidosis  Acute metabolic encephalopathy: CT Head: Progressive moderate atrophy and diffuse white matter disease. This likely reflects the sequela of chronic microvascular ischemia. Remote ischemic changes in the thalami bilaterally. -UA: 21-50 -B12: 712.---Ammonia 11.  -Could be related to AKI, dehydration, Invaz.  -MRI brain negative for acute stroke.  -Delirium precaution.  EEG pending.  On my exam, patient appears to be slightly sleepy but arousable and she is talkative and she appears to be fully oriented.   2-AKI/dehydration: -Patient presented with a creatinine of 2.2, prior creatinine per records 1.11 September 2021.  Unclear recent baseline. -Likely hypovolemia, prerenal in the setting of poor oral intake.  Creatinine is improving.  Will continue IV fluids but switch from current dextrose  fluids to LR. -Renal US; Bilateral renal cortical thinning and increased echogenicity, consistent medical renal disease. 2.4 cm irregular mass lesion along the left bladder wall, concerning for neoplasm. Correlation with urine cytology and cystoscopy is recommended. -Increase cr today. Will give extra fluids. Renal US results as above.    Bladder Mass:  Will need cystoscopy out patient.    Hypothyroidism: -Continue with Synthroid   Hypertension: Blood pressure elevated, will increase amlodipine to 5 mg.  Continue as needed antihypertensive medications.   Anemia: Iron deficiency.  -B 12: 712, Ferritin 737, folic acid 40, iron 20.  Started on iron supplement this admission.   Pyuria;  -Urine culture. Pending  -Recently treated with Invaz for UTI.    Hypoglycemia; much better, will stop dextrose fluid and switch to LR.  Hypokalemia: Will replace.  DVT prophylaxis: heparin injection 5,000 Units Start: 10/26/22 2200   Code Status: DNR  Family Communication:  None present at bedside.  Called and discussed with patient's son over the phone.  Requested him to come see the mother so they can assess her and let us know if she is back at baseline now.  He said he will and he will let the nurse know. Status is: Inpatient Remains inpatient appropriate because: Severe hypokalemia.  Potential discharge tomorrow.   Estimated body mass index is 28.32 kg/m as calculated from the following:   Height as of 09/27/21: 5' (1.524 m).   Weight as of 09/27/21: 65.8 kg.  Pressure Injury 06/21/21 Coccyx Mid Stage 2 -  Partial thickness loss of dermis presenting as a shallow open injury with a red, pink wound bed without slough. (Active)  06/21/21 2047  Location: Coccyx  Location Orientation: Mid  Staging: Stage  2 -  Partial thickness loss of dermis presenting as a shallow open injury with a red, pink wound bed without slough.  Wound Description (Comments):   Present on Admission: Yes   Nutritional  Assessment: There is no height or weight on file to calculate BMI.. Seen by dietician.  I agree with the assessment and plan as outlined below: Nutrition Status: Nutrition Problem: Inadequate oral intake Etiology: lethargy/confusion Signs/Symptoms: per patient/family report Interventions: MVI, Ensure Enlive (each supplement provides 350kcal and 20 grams of protein)  . Skin Assessment: I have examined the patient's skin and I agree with the wound assessment as performed by the wound care RN as outlined below: Pressure Injury 06/21/21 Coccyx Mid Stage 2 -  Partial thickness loss of dermis presenting as a shallow open injury with a red, pink wound bed without slough. (Active)  06/21/21 2047  Location: Coccyx  Location Orientation: Mid  Staging: Stage 2 -  Partial thickness loss of dermis presenting as a shallow open injury with a red, pink wound bed without slough.  Wound Description (Comments):   Present on Admission: Yes    Consultants:  None  Procedures:  None  Antimicrobials:  Anti-infectives (From admission, onward)    None         Subjective: Patient seen and examined.  Sleepy but easily arousable.  Fully oriented.  No complaints.  Objective: Vitals:   10/29/22 0351 10/29/22 0545 10/29/22 0637 10/29/22 0808  BP: (!) 159/58 (!) 181/59 (!) 146/57 (!) 140/65  Pulse: 62 64 63 (!) 59  Resp: 18   15  Temp: 99.9 F (37.7 C)   97.7 F (36.5 C)  TempSrc: Oral     SpO2: 100%   99%    Intake/Output Summary (Last 24 hours) at 10/29/2022 0810 Last data filed at 10/28/2022 1300 Gross per 24 hour  Intake --  Output 800 ml  Net -800 ml   There were no vitals filed for this visit.  Examination:  General exam: Appears calm and comfortable  Respiratory system: Clear to auscultation. Respiratory effort normal. Cardiovascular system: S1 & S2 heard, RRR. No JVD, murmurs, rubs, gallops or clicks. No pedal edema. Gastrointestinal system: Abdomen is nondistended, soft and  nontender. No organomegaly or masses felt. Normal bowel sounds heard. Central nervous system: Alert and oriented. No focal neurological deficits. Extremities: Symmetric 5 x 5 power. Skin: No rashes, lesions or ulcers   Data Reviewed: I have personally reviewed following labs and imaging studies  CBC: Recent Labs  Lab 10/26/22 1634 10/27/22 0430 10/28/22 0318 10/29/22 0320  WBC 9.2 7.8 9.6 9.3  NEUTROABS 6.1  --   --   --   HGB 10.1* 8.9* 8.9* 10.1*  HCT 32.5* 28.1* 26.0* 30.7*  MCV 100.0 97.2 92.5 95.0  PLT 324 276 260 998   Basic Metabolic Panel: Recent Labs  Lab 10/26/22 1634 10/26/22 2124 10/27/22 0430 10/28/22 0318 10/29/22 0320  NA 140  --  140 139 144  K 4.3  --  3.1* 3.1* 2.8*  CL 110  --  101 106 108  CO2 18*  --  '29 24 26  '$ GLUCOSE 81  --  66* 148* 147*  BUN 16  --  11 10 6*  CREATININE 2.22*  --  1.85* 1.92* 1.69*  CALCIUM 10.2  --  8.4* 9.2 9.7  MG  --  1.9  --   --   --   PHOS  --  3.8  --   --   --  GFR: CrCl cannot be calculated (Unknown ideal weight.). Liver Function Tests: Recent Labs  Lab 10/26/22 1634  AST 24  ALT 12  ALKPHOS 65  BILITOT 0.8  PROT 6.7  ALBUMIN 2.8*   No results for input(s): "LIPASE", "AMYLASE" in the last 168 hours. Recent Labs  Lab 10/26/22 2124  AMMONIA 11   Coagulation Profile: No results for input(s): "INR", "PROTIME" in the last 168 hours. Cardiac Enzymes: No results for input(s): "CKTOTAL", "CKMB", "CKMBINDEX", "TROPONINI" in the last 168 hours. BNP (last 3 results) No results for input(s): "PROBNP" in the last 8760 hours. HbA1C: No results for input(s): "HGBA1C" in the last 72 hours. CBG: Recent Labs  Lab 10/28/22 1603 10/28/22 1929 10/28/22 2333 10/29/22 0335 10/29/22 0805  GLUCAP 104* 117* 136* 126* 145*   Lipid Profile: No results for input(s): "CHOL", "HDL", "LDLCALC", "TRIG", "CHOLHDL", "LDLDIRECT" in the last 72 hours. Thyroid Function Tests: Recent Labs    10/26/22 2124  TSH 0.702    Anemia Panel: Recent Labs    10/26/22 2124 10/27/22 0430  VITAMINB12 712  --   FOLATE  --  >40.0  FERRITIN  --  326*  TIBC  --  176*  IRON  --  20*  RETICCTPCT  --  1.1   Sepsis Labs: Recent Labs  Lab 10/26/22 1635  LATICACIDVEN 1.2    Recent Results (from the past 240 hour(s))  Resp panel by RT-PCR (RSV, Flu A&B, Covid) Anterior Nasal Swab     Status: None   Collection Time: 10/26/22  5:32 PM   Specimen: Anterior Nasal Swab  Result Value Ref Range Status   SARS Coronavirus 2 by RT PCR NEGATIVE NEGATIVE Final    Comment: (NOTE) SARS-CoV-2 target nucleic acids are NOT DETECTED.  The SARS-CoV-2 RNA is generally detectable in upper respiratory specimens during the acute phase of infection. The lowest concentration of SARS-CoV-2 viral copies this assay can detect is 138 copies/mL. A negative result does not preclude SARS-Cov-2 infection and should not be used as the sole basis for treatment or other patient management decisions. A negative result may occur with  improper specimen collection/handling, submission of specimen other than nasopharyngeal swab, presence of viral mutation(s) within the areas targeted by this assay, and inadequate number of viral copies(<138 copies/mL). A negative result must be combined with clinical observations, patient history, and epidemiological information. The expected result is Negative.  Fact Sheet for Patients:  EntrepreneurPulse.com.au  Fact Sheet for Healthcare Providers:  IncredibleEmployment.be  This test is no t yet approved or cleared by the [place] FDA and  has been authorized for detection and/or diagnosis of SARS-CoV-2 by FDA under an Emergency Use Authorization (EUA). This EUA will remain  in effect (meaning this test can be used) for the duration of the COVID-19 declaration under Section 564(b)(1) of the Act, 21 U.S.C.section 360bbb-3(b)(1), unless the authorization is  terminated  or revoked sooner.       Influenza A by PCR NEGATIVE NEGATIVE Final   Influenza B by PCR NEGATIVE NEGATIVE Final    Comment: (NOTE) The Xpert Xpress SARS-CoV-2/FLU/RSV plus assay is intended as an aid in the diagnosis of influenza from Nasopharyngeal swab specimens and should not be used as a sole basis for treatment. Nasal washings and aspirates are unacceptable for Xpert Xpress SARS-CoV-2/FLU/RSV testing.  Fact Sheet for Patients: EntrepreneurPulse.com.au  Fact Sheet for Healthcare Providers: IncredibleEmployment.be  This test is not yet approved or cleared by the [place] FDA and has been authorized for detection and/or  diagnosis of SARS-CoV-2 by FDA under an Emergency Use Authorization (EUA). This EUA will remain in effect (meaning this test can be used) for the duration of the COVID-19 declaration under Section 564(b)(1) of the Act, 21 U.S.C. section 360bbb-3(b)(1), unless the authorization is terminated or revoked.     Resp Syncytial Virus by PCR NEGATIVE NEGATIVE Final    Comment: (NOTE) Fact Sheet for Patients: EntrepreneurPulse.com.au  Fact Sheet for Healthcare Providers: IncredibleEmployment.be  This test is not yet approved or cleared by the [place] FDA and has been authorized for detection and/or diagnosis of SARS-CoV-2 by FDA under an Emergency Use Authorization (EUA). This EUA will remain in effect (meaning this test can be used) for the duration of the COVID-19 declaration under Section 564(b)(1) of the Act, 21 U.S.C. section 360bbb-3(b)(1), unless the authorization is terminated or revoked.  Performed at [facility], [place] [address], [place], [place] [address]   MRSA Next Gen by PCR, Nasal     Status: Abnormal   Collection Time: 10/27/22  1:29 PM   Specimen: Nasal Mucosa; Nasal Swab  Result Value Ref Range Status   MRSA by PCR Next Gen DETECTED (A) NOT  DETECTED Final    Comment: RESULT CALLED TO, READ BACK BY AND VERIFIED WITH:  C/ VALERIE O., RN 10/27/22 1728 A. LAFRANCE (NOTE) The GeneXpert MRSA Assay (FDA approved for NASAL specimens only), is one component of a comprehensive MRSA colonization surveillance program. It is not intended to diagnose MRSA infection nor to guide or monitor treatment for MRSA infections. Test performance is not FDA approved in patients less than 17 years old. Performed at [facility], [place] [address], [place], [place] [address]      Radiology Studies: MR BRAIN WO CONTRAST  Result Date: 10/28/2022 CLINICAL DATA:  Encephalopathy.  Altered mental status EXAM: MRI HEAD WITHOUT CONTRAST TECHNIQUE: Multiplanar, multiecho pulse sequences of the brain and surrounding structures were obtained without intravenous contrast. COMPARISON:  CT head 10/26/2022 FINDINGS: Brain: Negative for acute infarct.  Negative for hemorrhage or mass. Motion degraded study Generalized atrophy. Mild to moderate white matter changes with periventricular deep white matter hyperintensity on T2 bilaterally. Brainstem and cerebellum intact Vascular: Normal arterial flow voids Skull and upper cervical spine: No focal skeletal lesion Sinuses/Orbits: Air-fluid levels in the sphenoid sinus. Remaining sinuses clear. Right cataract extraction Other: None IMPRESSION: 1. Negative for acute infarct. 2. Atrophy and mild to moderate chronic microvascular ischemic change in the white matter. 3. Air-fluid levels in the sphenoid sinus. Correlate with symptoms of sinusitis. Electronically Signed   By: Franchot Gallo M.D.   On: 10/28/2022 13:16   US RENAL  Result Date: 10/28/2022 CLINICAL DATA:  Acute kidney injury. EXAM: RENAL / URINARY TRACT ULTRASOUND COMPLETE COMPARISON:  CT abdomen pelvis dated June 21, 2021. FINDINGS: Right Kidney: Renal measurements: 8.0 x 4.3 x 4.1 cm = volume: 75 mL. Cortical thinning and increased echogenicity. No mass or  hydronephrosis visualized. Left Kidney: Renal measurements: 10.3 x 5.1 x 4.4 cm = volume: 119 mL. Cortical thinning and increased echogenicity. No mass or hydronephrosis visualized. Bladder: 2.4 x 1.8 x 1.4 cm irregular lesion along the left bladder wall. Other: None. IMPRESSION: 1. Bilateral renal cortical thinning and increased echogenicity, consistent medical renal disease. 2. 2.4 cm irregular mass lesion along the left bladder wall, concerning for neoplasm. Correlation with urine cytology and cystoscopy is recommended. Electronically Signed   By: Titus Dubin M.D.   On: 10/28/2022 10:31   DG  Abd 1 View  Result Date: 10/28/2022 CLINICAL DATA:  Abdominal pain. EXAM: ABDOMEN - 1 VIEW COMPARISON:  06/29/2021. FINDINGS: The bowel gas pattern is normal. Multiple surgical clips are present in the right upper quadrant and mid right abdomen. No renal calculi. Degenerative changes are noted in the lumbar spine. IMPRESSION: No evidence of bowel obstruction. Electronically Signed   By: Brett Fairy M.D.   On: 10/28/2022 00:22    Scheduled Meds:  amLODipine  5 mg Oral Daily   aspirin EC  81 mg Oral Daily   feeding supplement  237 mL Oral BID BM   ferrous sulfate  325 mg Oral Q breakfast   heparin  5,000 Units Subcutaneous Q8H   levothyroxine  75 mcg Oral Daily   multivitamin with minerals  1 tablet Oral Daily   pantoprazole  40 mg Oral Daily   potassium chloride  40 mEq Oral Q4H   Continuous Infusions:  dextrose 5% lactated ringers 100 mL/hr at 10/29/22 0102     LOS: 3 days   Darliss Cheney, MD [facility]  10/29/2022, 8:10 AM   *Please note that this is a verbal dictation therefore any spelling or grammatical errors are due to the "Julian One" system interpretation.  Please page via [place] and do not message via secure chat for urgent patient care matters. Secure chat can be used for non urgent patient care matters.  How to contact the [facility] Attending or Consulting provider [place] or covering provider during after hours [place], for this patient?  Check the care team in [facility] and look for a) attending/consulting TRH provider listed and b) the [facility] team listed. Page or secure chat [phone]. Log into [email or web address] and use [place]'s universal password to access. If you do not have the password, please contact the hospital operator. Locate the [facility] provider you are looking for under [facility] and page to a number that you can be directly reached. If you still have difficulty reaching the provider, please page the [facility] (Director on Call) for the Hospitalists listed on amion for assistance.

## 2022-10-29 NOTE — Procedures (Signed)
Patient Name: Cindy Gordon  MRN: [id number]  Epilepsy Attending: Lora Havens  Referring Physician/Provider: Elmarie Shiley, MD  Date: 10/29/2022  Duration: 22.29 mins  Patient history: 81yo F with ams. EEG to evaluate for seizure.  Level of alertness: Awake, asleep  AEDs during EEG study: None  Technical aspects: This EEG study was done with scalp electrodes positioned according to the 10-20 International system of electrode placement. Electrical activity was reviewed with band pass filter of 1-'70Hz'$ , sensitivity of 7 uV/mm, display speed of 39m/sec with a '60Hz'$  notched filter applied as appropriate. EEG data were recorded continuously and digitally stored.  Video monitoring was available and reviewed as appropriate.  Description: The posterior dominant rhythm consists of 8 Hz activity of moderate voltage (25-35 uV) seen predominantly in posterior head regions, symmetric and reactive to eye opening and eye closing. Sleep was characterized by vertex waves, sleep spindles (12 to 14 Hz), maximal frontocentral region. Hyperventilation and photic stimulation were not performed.     IMPRESSION: This study is within normal limits. No seizures or epileptiform discharges were seen throughout the recording.  Avonell Lenig OBarbra Sarks

## 2022-10-30 DIAGNOSIS — G9340 Encephalopathy, unspecified: Secondary | ICD-10-CM | POA: Diagnosis not present

## 2022-10-30 LAB — BASIC METABOLIC PANEL
Anion gap: 5 (ref 5–15)
Anion gap: 3 — ABNORMAL LOW (ref 5–15)
BUN: 6 mg/dL — ABNORMAL LOW (ref 8–23)
BUN: 6 mg/dL — ABNORMAL LOW (ref 8–23)
CO2: 26 mmol/L (ref 22–32)
CO2: 24 mmol/L (ref 22–32)
Calcium: 9.5 mg/dL (ref 8.9–10.3)
Calcium: 9 mg/dL (ref 8.9–10.3)
Chloride: 111 mmol/L (ref 98–111)
Chloride: 111 mmol/L (ref 98–111)
Creatinine, Ser: 1.83 mg/dL — ABNORMAL HIGH (ref 0.44–1.00)
Creatinine, Ser: 1.82 mg/dL — ABNORMAL HIGH (ref 0.44–1.00)
GFR, Estimated: 27 mL/min — ABNORMAL LOW (ref >60)
GFR, Estimated: 27 mL/min — ABNORMAL LOW (ref >60)
Glucose, Bld: 145 mg/dL — ABNORMAL HIGH (ref 70–99)
Glucose, Bld: 135 mg/dL — ABNORMAL HIGH (ref 70–99)
Potassium: 4.3 mmol/L (ref 3.5–5.1)
Potassium: 4.2 mmol/L (ref 3.5–5.1)
Sodium: 142 mmol/L (ref 135–145)
Sodium: 138 mmol/L (ref 135–145)

## 2022-10-30 LAB — GLUCOSE, CAPILLARY
Glucose-Capillary: 128 mg/dL — ABNORMAL HIGH (ref 70–99)
Glucose-Capillary: 146 mg/dL — ABNORMAL HIGH (ref 70–99)
Glucose-Capillary: 136 mg/dL — ABNORMAL HIGH (ref 70–99)
Glucose-Capillary: 150 mg/dL — ABNORMAL HIGH (ref 70–99)
Glucose-Capillary: 111 mg/dL — ABNORMAL HIGH (ref 70–99)

## 2022-10-30 MED ORDER — SODIUM CHLORIDE 0.9 % IV BOLUS
1000.0000 mL | Freq: Once | INTRAVENOUS | Status: AC
  Administered 2022-10-30: 1000 mL via INTRAVENOUS

## 2022-10-30 MED ORDER — AMLODIPINE BESYLATE 5 MG PO TABS: 5.0000 mg | ORAL_TABLET | Freq: Every day | ORAL | 0 refills | Status: AC

## 2022-10-30 NOTE — Progress Notes (Signed)
CSW spoke with [facility] in admissions who states the patient can return to the facility when ready.  Patient can be discharged back to [facility] in [place] today. Patient will go to room 802-A. Patient will be transported via [place]. The number to call for report is [phone].  Discharge packet completed and with unit secretary.  Madilyn Fireman, MSW, LCSW Transitions of Care  Clinical Social Worker II [phone]

## 2022-10-30 NOTE — Discharge Summary (Signed)
Physician Discharge Summary  Cindy Gordon ZOX:[id number] DOB: August 15, 1941 DOA: 10/26/2022  PCP: Patient, No Pcp Per  Admit date: 10/26/2022 Discharge date: 10/30/2022 30 Day Unplanned Readmission Risk Score    Flowsheet Row ED to Hosp-Admission (Current) from 10/26/2022 in [facility]  30 Day Unplanned Readmission Risk Score (%) 16.14 Filed at 10/30/2022 0801       This score is the patient's risk of an unplanned readmission within 30 days of being discharged (0 -100%). The score is based on dignosis, age, lab data, medications, orders, and past utilization.   Low:  0-14.9   Medium: 15-21.9   High: 22-29.9   Extreme: 30 and above          Admitted From: SNF Disposition: SNF  Recommendations for Outpatient Follow-up:  Follow up with PCP in 1-2 weeks Please obtain BMP/CBC in one week Follow-up with urology for possible cystoscopy for bladder mass. Please follow up with your PCP on the following pending results: Unresulted Labs (From admission, onward)     Start     Ordered   10/26/22 2023  Sodium, urine, random  Add-on,   AD        10/26/22 2022   10/26/22 2023  Creatinine, urine, random  Add-on,   AD        10/26/22 2022              Home Health: None Equipment/Devices: None  Discharge Condition: Stable CODE STATUS: DNR Diet recommendation: Cardiac  Subjective: Seen and examined earlier's morning.  Patient was fully alert and oriented and she had no complaints.  Brief/Interim Summary: 81 year old with past medical history significant for memory loss, hypertension, diet-controlled diabetes, hyperlipidemia, fibromyalgia, who presented from skilled nursing facility for evaluation of altered mental status.  Patient had been undergoing treatment with Invanz for almost a week at her skilled nursing facility for UTI when she developed hallucination and confusion and stopped eating on 10/24/2022.  She was not able to recognize her son.    Evaluation  in the ED.  CT head with progressive atrophy and diffuse white matter disease.  Chest x-ray with perihilar basilar airspace disease.  Presented with a creatinine of 2.2   Admitted for presumed AKI and acute encephalopathy and further evaluation.   Acute metabolic encephalopathy: CT Head: Progressive moderate atrophy and diffuse white matter disease. This likely reflects the sequela of chronic microvascular ischemia. Remote ischemic changes in the thalami bilaterally. -UA: 21-50 -B12: 712.---Ammonia 11.  -Could be related to AKI on CKD, dehydration, Invaz.  -MRI brain negative for acute stroke.  -Delirium precaution.  EEG will as well.   2-AKI on CKD stage IV/dehydration: -Patient presented with a creatinine of 2.2, prior creatinine per records 83.11 September 2021.  Unclear recent baseline. -Likely hypovolemia, prerenal in the setting of poor oral intake.  Creatinine improved with IV fluids but has remained stable around 1.8 range and in the range of CKD stage IV, presuming this is her baseline, she has CKD stage IV and her diagnosis would be AKI on CKD stage IV.   -Renal US; Bilateral renal cortical thinning and increased echogenicity, consistent medical renal disease. 2.4 cm irregular mass lesion along the left bladder wall, concerning for neoplasm.  Cystoscopy is recommended as outpatient.   Bladder Mass:  Will need cystoscopy out patient.  Follow-up with urology.   Hypothyroidism: -Continue with Synthroid   Hypertension: Blood pressure elevated, increased amlodipine to 5 mg.    Anemia: Iron deficiency.  -  B 12: 712, Ferritin 542, folic acid 40, iron 20.  Started on iron supplement this admission.   Pyuria;  -Urine culture. Pending  -Recently treated with Invaz for UTI.    Hypoglycemia; much better, received stress fluids in the beginning.   Hypokalemia: Resolved.  Discharge plan was discussed with patient and/or family member and they verbalized understanding and agreed with it.   Discharge Diagnoses:  Principal Problem:   Acute encephalopathy Active Problems:   Essential hypertension, benign   Controlled type 2 diabetes mellitus with diabetic polyneuropathy, without long-term current use of insulin (HCC)   Acute renal failure superimposed on stage 4 chronic kidney disease (HCC)   Metabolic acidosis    Discharge Instructions   Allergies as of 10/30/2022       Reactions   Contrast Media [iodinated Contrast Media] Other (See Comments)   Cardiac arrest   Hydrocodone Other (See Comments)   Hallucinations   Codeine    Crestor [rosuvastatin] Other (See Comments)   Myalgia   Lisinopril    Naproxen    constipation   Niaspan [niacin] Other (See Comments)   flushing   Penicillins    Itchy.        Medication List     STOP taking these medications    ertapenem  IVPB Commonly known as: INVANZ   HEPARIN LOCK FLUSH IV   sodium chloride 0.9 % infusion       TAKE these medications    amLODipine 5 MG tablet Commonly known as: NORVASC Take 1 tablet (5 mg total) by mouth daily. What changed:  medication strength how much to take   ascorbic acid 500 MG tablet Commonly known as: VITAMIN C Take 500 mg by mouth daily.   aspirin EC 81 MG tablet Take 81 mg by mouth daily. Swallow whole.   ferrous sulfate 325 (65 FE) MG tablet Take 325 mg by mouth every Monday, Wednesday, and Friday.   folic acid 1 MG tablet Commonly known as: FOLVITE Take 1 mg by mouth daily.   levothyroxine 88 MCG tablet Commonly known as: SYNTHROID Take 88 mcg by mouth daily. For thyroid   multivitamin tablet Take 1 tablet by mouth daily.   ONE TOUCH ULTRA MINI w/Device Kit Use daily to check blood sugar levels. E11.42   OneTouch Ultra test strip Generic drug: glucose blood CHECK BLOOD SUGAR EVERY DAY   pantoprazole 40 MG tablet Commonly known as: PROTONIX Take 1 tablet (40 mg total) by mouth daily. What changed: additional instructions   polyethylene  glycol 17 g packet Commonly known as: MIRALAX / GLYCOLAX Take 17 g by mouth daily.   potassium chloride SA 20 MEQ tablet Commonly known as: KLOR-CON M Take 20 mEq by mouth daily.   pregabalin 75 MG capsule Commonly known as: LYRICA Take 75 mg by mouth at bedtime. For neuropathy What changed: Another medication with the same name was removed. Continue taking this medication, and follow the directions you see here.   Savella 100 MG Tabs tablet Generic drug: Milnacipran HCl Take 1 tablet (100 mg total) by mouth 2 (two) times daily.   Vitamin D (Ergocalciferol) 1.25 MG (50000 UNIT) Caps capsule Commonly known as: DRISDOL Take 50,000 Units by mouth every 30 (thirty) days.        Follow-up Information     PCP Follow up in 1 week(s).                 Allergies  Allergen Reactions   Contrast Media [Iodinated Contrast  Media] Other (See Comments)    Cardiac arrest   Hydrocodone Other (See Comments)    Hallucinations   Codeine    Crestor [Rosuvastatin] Other (See Comments)    Myalgia    Lisinopril    Naproxen     constipation   Niaspan [Niacin] Other (See Comments)    flushing   Penicillins     Itchy.    Consultations: None   Procedures/Studies: EEG adult  Result Date: 11/14/22 Lora Havens, MD     11/14/22 12:27 PM Patient Name: Cindy Gordon MRN: [id number] Epilepsy Attending: Lora Havens Referring Physician/Provider: Elmarie Shiley, MD Date: 11-14-2022 Duration: 22.29 mins Patient history: 81yo F with ams. EEG to evaluate for seizure. Level of alertness: Awake, asleep AEDs during EEG study: None Technical aspects: This EEG study was done with scalp electrodes positioned according to the 10-20 International system of electrode placement. Electrical activity was reviewed with band pass filter of 1-_0 , sensitivity of 7 uV/mm, display speed of 25m/sec with a _1  notched filter applied as appropriate. EEG data were recorded continuously and  digitally stored.  Video monitoring was available and reviewed as appropriate. Description: The posterior dominant rhythm consists of 8 Hz activity of moderate voltage (25-35 uV) seen predominantly in posterior head regions, symmetric and reactive to eye opening and eye closing. Sleep was characterized by vertex waves, sleep spindles (12 to 14 Hz), maximal frontocentral region. Hyperventilation and photic stimulation were not performed.   IMPRESSION: This study is within normal limits. No seizures or epileptiform discharges were seen throughout the recording. PLora Havens  MR BRAIN WO CONTRAST  Result Date: 10/28/2022 CLINICAL DATA:  Encephalopathy.  Altered mental status EXAM: MRI HEAD WITHOUT CONTRAST TECHNIQUE: Multiplanar, multiecho pulse sequences of the brain and surrounding structures were obtained without intravenous contrast. COMPARISON:  CT head 10/26/2022 FINDINGS: Brain: Negative for acute infarct.  Negative for hemorrhage or mass. Motion degraded study Generalized atrophy. Mild to moderate white matter changes with periventricular deep white matter hyperintensity on T2 bilaterally. Brainstem and cerebellum intact Vascular: Normal arterial flow voids Skull and upper cervical spine: No focal skeletal lesion Sinuses/Orbits: Air-fluid levels in the sphenoid sinus. Remaining sinuses clear. Right cataract extraction Other: None IMPRESSION: 1. Negative for acute infarct. 2. Atrophy and mild to moderate chronic microvascular ischemic change in the white matter. 3. Air-fluid levels in the sphenoid sinus. Correlate with symptoms of sinusitis. Electronically Signed   By: CFranchot GalloM.D.   On: 10/28/2022 13:16   [place]RENAL  Result Date: 10/28/2022 CLINICAL DATA:  Acute kidney injury. EXAM: RENAL / URINARY TRACT ULTRASOUND COMPLETE COMPARISON:  CT abdomen pelvis dated June 21, 2021. FINDINGS: Right Kidney: Renal measurements: 8.0 x 4.3 x 4.1 cm = volume: 75 mL. Cortical thinning and increased  echogenicity. No mass or hydronephrosis visualized. Left Kidney: Renal measurements: 10.3 x 5.1 x 4.4 cm = volume: 119 mL. Cortical thinning and increased echogenicity. No mass or hydronephrosis visualized. Bladder: 2.4 x 1.8 x 1.4 cm irregular lesion along the left bladder wall. Other: None. IMPRESSION: 1. Bilateral renal cortical thinning and increased echogenicity, consistent medical renal disease. 2. 2.4 cm irregular mass lesion along the left bladder wall, concerning for neoplasm. Correlation with urine cytology and cystoscopy is recommended. Electronically Signed   By: WTitus DubinM.D.   On: 10/28/2022 10:31   DG Abd 1 View  Result Date: 10/28/2022 CLINICAL DATA:  Abdominal pain. EXAM: ABDOMEN - 1 VIEW COMPARISON:  06/29/2021. FINDINGS: The bowel gas pattern  is normal. Multiple surgical clips are present in the right upper quadrant and mid right abdomen. No renal calculi. Degenerative changes are noted in the lumbar spine. IMPRESSION: No evidence of bowel obstruction. Electronically Signed   By: Brett Fairy M.D.   On: 10/28/2022 00:22   CT Head Wo Contrast  Result Date: 10/26/2022 CLINICAL DATA:  Mental status change, unknown cause. Urinary tract infection. EXAM: CT HEAD WITHOUT CONTRAST TECHNIQUE: Contiguous axial images were obtained from the base of the skull through the vertex without intravenous contrast. RADIATION DOSE REDUCTION: This exam was performed according to the departmental dose-optimization program which includes automated exposure control, adjustment of the mA and/or kV according to patient size and/or use of iterative reconstruction technique. COMPARISON:  CT head without contrast 09/27/2021 FINDINGS: Brain: Progressive moderate atrophy and diffuse white matter disease is present. Prominent white matter hypoattenuation is present posterior limb of the internal capsules bilaterally. Remote ischemic changes are again noted in the thalami. Streak artifact is present across the  posterior fossa. No discrete lesions are evident. The ventricles are proportionate to the degree of atrophy. No significant extraaxial fluid collection is present. Vascular: No hyperdense vessel or unexpected calcification. Skull: Calvarium is intact. No focal lytic or blastic lesions are present. No significant extracranial soft tissue lesion is present. Sinuses/Orbits: The paranasal sinuses and mastoid air cells are clear. Fluid levels are present within the sphenoid sinuses bilaterally. The paranasal sinuses and mastoid air cells are otherwise clear. IMPRESSION: 1. Progressive moderate atrophy and diffuse white matter disease. This likely reflects the sequela of chronic microvascular ischemia. 2. Remote ischemic changes in the thalami bilaterally. 3. Fluid levels within the sphenoid sinuses bilaterally. This likely reflects acute sinusitis. Electronically Signed   By: San Morelle M.D.   On: 10/26/2022 17:15   DG Chest Portable 1 View  Result Date: 10/26/2022 CLINICAL DATA:  Altered mental status. EXAM: PORTABLE CHEST 1 VIEW COMPARISON:  One-view chest x-ray 09/27/2021 FINDINGS: Heart is mildly enlarged, stable. Atherosclerotic calcifications are again noted at the aortic arch. Right perihilar patchy airspace opacities are present. Left basilar opacities are also present. The upper lung fields are clear. Visualized soft tissues and bony thorax are stable. IMPRESSION: Patchy right perihilar and left basilar airspace disease. While this may represent atelectasis, infection is not excluded. Electronically Signed   By: San Morelle M.D.   On: 10/26/2022 16:46     Discharge Exam: Vitals:   10/30/22 0432 10/30/22 0814  BP: (!) 169/55 (!) 159/65  Pulse: 73 72  Resp: 18 18  Temp: 98.2 F (36.8 C) 98.4 F (36.9 C)  SpO2: 98% 92%   Vitals:   10/29/22 1611 10/29/22 1929 10/30/22 0432 10/30/22 0814  BP: (!) 161/63 139/82 (!) 169/55 (!) 159/65  Pulse: 65 72 73 72  Resp: _0 Temp: 98.3 F (36.8 C) 98.6 F (37 C) 98.2 F (36.8 C) 98.4 F (36.9 C)  TempSrc: Oral Oral Oral Oral  SpO2: 100% 100% 98% 92%    General: Pt is alert, awake, not in acute distress Cardiovascular: RRR, S1/S2 +, no rubs, no gallops Respiratory: CTA bilaterally, no wheezing, no rhonchi Abdominal: Soft, NT, ND, bowel sounds + Extremities: no edema, no cyanosis    The results of significant diagnostics from this hospitalization (including imaging, microbiology, ancillary and laboratory) are listed below for reference.     Microbiology: Recent Results (from the past 240 hour(s))  Resp panel by RT-PCR (RSV, Flu A&B, Covid) Anterior Nasal Swab  Status: None   Collection Time: 10/26/22  5:32 PM   Specimen: Anterior Nasal Swab  Result Value Ref Range Status   SARS Coronavirus 2 by RT PCR NEGATIVE NEGATIVE Final    Comment: (NOTE) SARS-CoV-2 target nucleic acids are NOT DETECTED.  The SARS-CoV-2 RNA is generally detectable in upper respiratory specimens during the acute phase of infection. The lowest concentration of SARS-CoV-2 viral copies this assay can detect is 138 copies/mL. A negative result does not preclude SARS-Cov-2 infection and should not be used as the sole basis for treatment or other patient management decisions. A negative result may occur with  improper specimen collection/handling, submission of specimen other than nasopharyngeal swab, presence of viral mutation(s) within the areas targeted by this assay, and inadequate number of viral copies(<138 copies/mL). A negative result must be combined with clinical observations, patient history, and epidemiological information. The expected result is Negative.  Fact Sheet for Patients:  EntrepreneurPulse.com.au  Fact Sheet for Healthcare Providers:  IncredibleEmployment.be  This test is no t yet approved or cleared by the [place] FDA and  has been authorized for detection  and/or diagnosis of SARS-CoV-2 by FDA under an Emergency Use Authorization (EUA). This EUA will remain  in effect (meaning this test can be used) for the duration of the COVID-19 declaration under Section 564(b)(1) of the Act, 21 U.S.C.section 360bbb-3(b)(1), unless the authorization is terminated  or revoked sooner.       Influenza A by PCR NEGATIVE NEGATIVE Final   Influenza B by PCR NEGATIVE NEGATIVE Final    Comment: (NOTE) The Xpert Xpress SARS-CoV-2/FLU/RSV plus assay is intended as an aid in the diagnosis of influenza from Nasopharyngeal swab specimens and should not be used as a sole basis for treatment. Nasal washings and aspirates are unacceptable for Xpert Xpress SARS-CoV-2/FLU/RSV testing.  Fact Sheet for Patients: EntrepreneurPulse.com.au  Fact Sheet for Healthcare Providers: IncredibleEmployment.be  This test is not yet approved or cleared by the [place] FDA and has been authorized for detection and/or diagnosis of SARS-CoV-2 by FDA under an Emergency Use Authorization (EUA). This EUA will remain in effect (meaning this test can be used) for the duration of the COVID-19 declaration under Section 564(b)(1) of the Act, 21 U.S.C. section 360bbb-3(b)(1), unless the authorization is terminated or revoked.     Resp Syncytial Virus by PCR NEGATIVE NEGATIVE Final    Comment: (NOTE) Fact Sheet for Patients: EntrepreneurPulse.com.au  Fact Sheet for Healthcare Providers: IncredibleEmployment.be  This test is not yet approved or cleared by the [place] FDA and has been authorized for detection and/or diagnosis of SARS-CoV-2 by FDA under an Emergency Use Authorization (EUA). This EUA will remain in effect (meaning this test can be used) for the duration of the COVID-19 declaration under Section 564(b)(1) of the Act, 21 U.S.C. section 360bbb-3(b)(1), unless the authorization is terminated  or revoked.  Performed at [facility], [place] [address] [place], [place] [address]   Urine Culture     Status: None   Collection Time: 10/27/22  8:20 AM   Specimen: Urine, Clean Catch  Result Value Ref Range Status   Specimen Description URINE, CLEAN CATCH  Final   Special Requests NONE  Final   Culture   Final    NO GROWTH Performed at [facility], [place] [address], [place], [place] [address]    Report Status 10/29/2022 FINAL  Final  MRSA Next Gen by PCR, Nasal     Status: Abnormal   Collection Time:  10/27/22  1:29 PM   Specimen: Nasal Mucosa; Nasal Swab  Result Value Ref Range Status   MRSA by PCR Next Gen DETECTED (A) NOT DETECTED Final    Comment: RESULT CALLED TO, READ BACK BY AND VERIFIED WITH:  C/ VALERIE O., RN 10/27/22 1728 A. LAFRANCE (NOTE) The GeneXpert MRSA Assay (FDA approved for NASAL specimens only), is one component of a comprehensive MRSA colonization surveillance program. It is not intended to diagnose MRSA infection nor to guide or monitor treatment for MRSA infections. Test performance is not FDA approved in patients less than 74 years old. Performed at [facility], [place] [address], [place], [place] [address]      Labs: BNP (last 3 results) No results for input(s): "BNP" in the last 8760 hours. Basic Metabolic Panel: Recent Labs  Lab 10/26/22 2124 10/27/22 0430 10/28/22 0318 10/29/22 0320 10/30/22 0517 10/30/22 1239  NA  --  140 139 144 142 138  K  --  3.1* 3.1* 2.8* 4.3 4.2  CL  --  101 106 108 111 111  CO2  --  _0 GLUCOSE  --  66* 148* 147* 145* 135*  BUN  --  11 10 6* 6* 6*  CREATININE  --  1.85* 1.92* 1.69* 1.83* 1.82*  CALCIUM  --  8.4* 9.2 9.7 9.5 9.0  MG 1.9  --   --   --   --   --   PHOS 3.8  --   --   --   --   --    Liver Function Tests: Recent Labs  Lab 10/26/22 1634  AST 24  ALT 12  ALKPHOS 65  BILITOT 0.8  PROT 6.7  ALBUMIN 2.8*   No results for input(s): "LIPASE", "AMYLASE" in the  last 168 hours. Recent Labs  Lab 10/26/22 2124  AMMONIA 11   CBC: Recent Labs  Lab 10/26/22 1634 10/27/22 0430 10/28/22 0318 10/29/22 0320  WBC 9.2 7.8 9.6 9.3  NEUTROABS 6.1  --   --   --   HGB 10.1* 8.9* 8.9* 10.1*  HCT 32.5* 28.1* 26.0* 30.7*  MCV 100.0 97.2 92.5 95.0  PLT 324 276 260 278   Cardiac Enzymes: No results for input(s): "CKTOTAL", "CKMB", "CKMBINDEX", "TROPONINI" in the last 168 hours. BNP: Invalid input(s): "POCBNP" CBG: Recent Labs  Lab 10/29/22 1936 10/30/22 0111 10/30/22 0433 10/30/22 0811 10/30/22 1137  GLUCAP 141* 128* 146* 136* 150*   D-Dimer No results for input(s): "DDIMER" in the last 72 hours. Hgb A1c No results for input(s): "HGBA1C" in the last 72 hours. Lipid Profile No results for input(s): "CHOL", "HDL", "LDLCALC", "TRIG", "CHOLHDL", "LDLDIRECT" in the last 72 hours. Thyroid function studies No results for input(s): "TSH", "T4TOTAL", "T3FREE", "THYROIDAB" in the last 72 hours.  Invalid input(s): "FREET3" Anemia work up No results for input(s): "VITAMINB12", "FOLATE", "FERRITIN", "TIBC", "IRON", "RETICCTPCT" in the last 72 hours. Urinalysis    Component Value Date/Time   COLORURINE YELLOW 10/26/2022 1854   APPEARANCEUR HAZY (A) 10/26/2022 1854   LABSPEC 1.009 10/26/2022 1854   PHURINE 5.0 10/26/2022 1854   GLUCOSEU NEGATIVE 10/26/2022 1854   HGBUR SMALL (A) 10/26/2022 [place] 10/26/2022 1854   BILIRUBINUR negative 12/24/2020 1008   BILIRUBINUR neg 07/23/2020 1152   KETONESUR 5 (A) 10/26/2022 [place] 10/26/2022 1854   UROBILINOGEN 0.2 12/24/2020 1008   NITRITE NEGATIVE 10/26/2022 1854   LEUKOCYTESUR SMALL (A) 10/26/2022 1854   Sepsis  Labs Recent Labs  Lab 10/26/22 1634 10/27/22 0430 10/28/22 0318 10/29/22 0320  WBC 9.2 7.8 9.6 9.3   Microbiology Recent Results (from the past 240 hour(s))  Resp panel by RT-PCR (RSV, Flu A&B, Covid) Anterior Nasal Swab     Status: None    Collection Time: 10/26/22  5:32 PM   Specimen: Anterior Nasal Swab  Result Value Ref Range Status   SARS Coronavirus 2 by RT PCR NEGATIVE NEGATIVE Final    Comment: (NOTE) SARS-CoV-2 target nucleic acids are NOT DETECTED.  The SARS-CoV-2 RNA is generally detectable in upper respiratory specimens during the acute phase of infection. The lowest concentration of SARS-CoV-2 viral copies this assay can detect is 138 copies/mL. A negative result does not preclude SARS-Cov-2 infection and should not be used as the sole basis for treatment or other patient management decisions. A negative result may occur with  improper specimen collection/handling, submission of specimen other than nasopharyngeal swab, presence of viral mutation(s) within the areas targeted by this assay, and inadequate number of viral copies(<138 copies/mL). A negative result must be combined with clinical observations, patient history, and epidemiological information. The expected result is Negative.  Fact Sheet for Patients:  EntrepreneurPulse.com.au  Fact Sheet for Healthcare Providers:  IncredibleEmployment.be  This test is no t yet approved or cleared by the [place] FDA and  has been authorized for detection and/or diagnosis of SARS-CoV-2 by FDA under an Emergency Use Authorization (EUA). This EUA will remain  in effect (meaning this test can be used) for the duration of the COVID-19 declaration under Section 564(b)(1) of the Act, 21 U.S.C.section 360bbb-3(b)(1), unless the authorization is terminated  or revoked sooner.       Influenza A by PCR NEGATIVE NEGATIVE Final   Influenza B by PCR NEGATIVE NEGATIVE Final    Comment: (NOTE) The Xpert Xpress SARS-CoV-2/FLU/RSV plus assay is intended as an aid in the diagnosis of influenza from Nasopharyngeal swab specimens and should not be used as a sole basis for treatment. Nasal washings and aspirates are unacceptable for  Xpert Xpress SARS-CoV-2/FLU/RSV testing.  Fact Sheet for Patients: EntrepreneurPulse.com.au  Fact Sheet for Healthcare Providers: IncredibleEmployment.be  This test is not yet approved or cleared by the [place] FDA and has been authorized for detection and/or diagnosis of SARS-CoV-2 by FDA under an Emergency Use Authorization (EUA). This EUA will remain in effect (meaning this test can be used) for the duration of the COVID-19 declaration under Section 564(b)(1) of the Act, 21 U.S.C. section 360bbb-3(b)(1), unless the authorization is terminated or revoked.     Resp Syncytial Virus by PCR NEGATIVE NEGATIVE Final    Comment: (NOTE) Fact Sheet for Patients: EntrepreneurPulse.com.au  Fact Sheet for Healthcare Providers: IncredibleEmployment.be  This test is not yet approved or cleared by the [place] FDA and has been authorized for detection and/or diagnosis of SARS-CoV-2 by FDA under an Emergency Use Authorization (EUA). This EUA will remain in effect (meaning this test can be used) for the duration of the COVID-19 declaration under Section 564(b)(1) of the Act, 21 U.S.C. section 360bbb-3(b)(1), unless the authorization is terminated or revoked.  Performed at [facility], [place] [address], [place], [place] [address]   Urine Culture     Status: None   Collection Time: 10/27/22  8:20 AM   Specimen: Urine, Clean Catch  Result Value Ref Range Status   Specimen Description URINE, CLEAN CATCH  Final   Special Requests NONE  Final   Culture  Final    NO GROWTH Performed at [facility], [place] [address], [place], [place] [address]    Report Status 10/29/2022 FINAL  Final  MRSA Next Gen by PCR, Nasal     Status: Abnormal   Collection Time: 10/27/22  1:29 PM   Specimen: Nasal Mucosa; Nasal Swab  Result Value Ref Range Status   MRSA by PCR Next Gen DETECTED (A) NOT DETECTED Final     Comment: RESULT CALLED TO, READ BACK BY AND VERIFIED WITH:  C/ VALERIE O., RN 10/27/22 1728 A. LAFRANCE (NOTE) The GeneXpert MRSA Assay (FDA approved for NASAL specimens only), is one component of a comprehensive MRSA colonization surveillance program. It is not intended to diagnose MRSA infection nor to guide or monitor treatment for MRSA infections. Test performance is not FDA approved in patients less than 39 years old. Performed at [facility], [place] [address] [place], [place] [address]      Time coordinating discharge: Over 30 minutes  SIGNED:   Darliss Cheney, MD  [facility] 10/30/2022, 2:10 PM *Please note that this is a verbal dictation therefore any spelling or grammatical errors are due to the "[employer]" system interpretation. If 7PM-7AM, please contact night-coverage [email or web address]

## 2022-10-31 DIAGNOSIS — G9340 Encephalopathy, unspecified: Secondary | ICD-10-CM | POA: Diagnosis not present

## 2022-10-31 LAB — GLUCOSE, CAPILLARY: Glucose-Capillary: 129 mg/dL — ABNORMAL HIGH (ref 70–99)

## 2022-10-31 NOTE — Discharge Summary (Signed)
Physician Discharge Summary  Cindy Gordon NID:[id number] DOB: [birth year] DOA: 10/26/2022  PCP: Patient, No Pcp Per  Admit date: 10/26/2022 Discharge date: 10/31/2022  Addendum 10/31/2022 9:15 AM.: Patient was discharged on 02/28/2022 but per reports from nursing today, for some reason, patient was not picked up by the transport.  Patient will be discharged today.  Patient seen and examined.  She has no complaints.  She remains is a stable and ready for discharge as she was yesterday.  30 Day Unplanned Readmission Risk Score    Flowsheet Row ED to Hosp-Admission (Current) from 10/26/2022 in [facility]  30 Day Unplanned Readmission Risk Score (%) 16.14 Filed at 10/30/2022 0801       This score is the patient's risk of an unplanned readmission within 30 days of being discharged (0 -100%). The score is based on dignosis, age, lab data, medications, orders, and past utilization.   Low:  0-14.9   Medium: 15-21.9   High: 22-29.9   Extreme: 30 and above          Admitted From: SNF Disposition: SNF  Recommendations for Outpatient Follow-up:  Follow up with PCP in 1-2 weeks Please obtain BMP/CBC in one week Follow-up with urology for possible cystoscopy for bladder mass. Please follow up with your PCP on the following pending results: Unresulted Labs (From admission, onward)     Start     Ordered   10/26/22 2023  Sodium, urine, random  Add-on,   AD        10/26/22 2022   10/26/22 2023  Creatinine, urine, random  Add-on,   AD        10/26/22 2022              Home Health: None Equipment/Devices: None  Discharge Condition: Stable CODE STATUS: DNR Diet recommendation: Cardiac  Subjective: Seen and examined earlier's morning.  Patient was fully alert and oriented and she had no complaints.  Brief/Interim Summary: 81 year old with past medical history significant for memory loss, hypertension, diet-controlled diabetes, hyperlipidemia, fibromyalgia, who  presented from skilled nursing facility for evaluation of altered mental status.  Patient had been undergoing treatment with Invanz for almost a week at her skilled nursing facility for UTI when she developed hallucination and confusion and stopped eating on 10/24/2022.  She was not able to recognize her son.    Evaluation in the ED.  CT head with progressive atrophy and diffuse white matter disease.  Chest x-ray with perihilar basilar airspace disease.  Presented with a creatinine of 2.2   Admitted for presumed AKI and acute encephalopathy and further evaluation.   Acute metabolic encephalopathy: CT Head: Progressive moderate atrophy and diffuse white matter disease. This likely reflects the sequela of chronic microvascular ischemia. Remote ischemic changes in the thalami bilaterally. -UA: 21-50 -B12: 712.---Ammonia 11.  -Could be related to AKI on CKD, dehydration, Invaz.  -MRI brain negative for acute stroke.  -Delirium precaution.  EEG will as well.   2-AKI on CKD stage IV/dehydration: -Patient presented with a creatinine of 2.2, prior creatinine per records 1.11 September 2021.  Unclear recent baseline. -Likely hypovolemia, prerenal in the setting of poor oral intake.  Creatinine improved with IV fluids but has remained stable around 1.8 range and in the range of CKD stage IV, presuming this is her baseline, she has CKD stage IV and her diagnosis would be AKI on CKD stage IV.   -Renal US; Bilateral renal cortical thinning and increased echogenicity, consistent medical renal  disease. 2.4 cm irregular mass lesion along the left bladder wall, concerning for neoplasm.  Cystoscopy is recommended as outpatient.   Bladder Mass:  Will need cystoscopy out patient.  Follow-up with urology.   Hypothyroidism: -Continue with Synthroid   Hypertension: Blood pressure elevated, increased amlodipine to 5 mg.    Anemia: Iron deficiency.  -B 12: 712, Ferritin 528, folic acid 40, iron 20.  Started on iron  supplement this admission.   Pyuria;  -Urine culture. Pending  -Recently treated with Invaz for UTI.    Hypoglycemia; much better, received stress fluids in the beginning.   Hypokalemia: Resolved.  Discharge plan was discussed with patient and/or family member and they verbalized understanding and agreed with it.  Discharge Diagnoses:  Principal Problem:   Acute encephalopathy Active Problems:   Essential hypertension, benign   Controlled type 2 diabetes mellitus with diabetic polyneuropathy, without long-term current use of insulin (HCC)   Acute renal failure superimposed on stage 4 chronic kidney disease (HCC)   Metabolic acidosis    Discharge Instructions   Allergies as of 10/31/2022       Reactions   Contrast Media [iodinated Contrast Media] Other (See Comments)   Cardiac arrest   Hydrocodone Other (See Comments)   Hallucinations   Codeine    Crestor [rosuvastatin] Other (See Comments)   Myalgia   Lisinopril    Naproxen    constipation   Niaspan [niacin] Other (See Comments)   flushing   Penicillins    Itchy.        Medication List     STOP taking these medications    ertapenem  IVPB Commonly known as: INVANZ   HEPARIN LOCK FLUSH IV   sodium chloride 0.9 % infusion       TAKE these medications    amLODipine 5 MG tablet Commonly known as: NORVASC Take 1 tablet (5 mg total) by mouth daily. What changed:  medication strength how much to take   ascorbic acid 500 MG tablet Commonly known as: VITAMIN C Take 500 mg by mouth daily.   aspirin EC 81 MG tablet Take 81 mg by mouth daily. Swallow whole.   ferrous sulfate 325 (65 FE) MG tablet Take 325 mg by mouth every Monday, Wednesday, and Friday.   folic acid 1 MG tablet Commonly known as: FOLVITE Take 1 mg by mouth daily.   levothyroxine 88 MCG tablet Commonly known as: SYNTHROID Take 88 mcg by mouth daily. For thyroid   multivitamin tablet Take 1 tablet by mouth daily.   ONE TOUCH  ULTRA MINI w/Device Kit Use daily to check blood sugar levels. E11.42   OneTouch Ultra test strip Generic drug: glucose blood CHECK BLOOD SUGAR EVERY DAY   pantoprazole 40 MG tablet Commonly known as: PROTONIX Take 1 tablet (40 mg total) by mouth daily. What changed: additional instructions   polyethylene glycol 17 g packet Commonly known as: MIRALAX / GLYCOLAX Take 17 g by mouth daily.   potassium chloride SA 20 MEQ tablet Commonly known as: KLOR-CON M Take 20 mEq by mouth daily.   pregabalin 75 MG capsule Commonly known as: LYRICA Take 75 mg by mouth at bedtime. For neuropathy What changed: Another medication with the same name was removed. Continue taking this medication, and follow the directions you see here.   Savella 100 MG Tabs tablet Generic drug: Milnacipran HCl Take 1 tablet (100 mg total) by mouth 2 (two) times daily.   Vitamin D (Ergocalciferol) 1.25 MG (50000 UNIT) Caps capsule  Commonly known as: DRISDOL Take 50,000 Units by mouth every 30 (thirty) days.        Contact information for follow-up providers     PCP Follow up in 1 week(s).          [facility] Follow up in 1 week(s).   Contact information: [place] [phone]             Contact information for after-discharge care     Destination     [facility]erred SNF .   Service: Skilled Nursing Contact information: [place] [phone]                    Allergies  Allergen Reactions   Contrast Media [Iodinated Contrast Media] Other (See Comments)    Cardiac arrest   Hydrocodone Other (See Comments)    Hallucinations   Codeine    Crestor [Rosuvastatin] Other (See Comments)    Myalgia    Lisinopril    Naproxen     constipation   Niaspan [Niacin] Other (See Comments)    flushing   Penicillins     Itchy.    Consultations:  None   Procedures/Studies: EEG adult  Result Date: 11-24-2022 Lora Havens, MD     Nov 24, 2022 12:27 PM Patient Name: Cindy Gordon MRN: [id number] Epilepsy Attending: Lora Havens Referring Physician/Provider: Elmarie Shiley, MD Date: Nov 24, 2022 Duration: 22.29 mins Patient history: 81yo F with ams. EEG to evaluate for seizure. Level of alertness: Awake, asleep AEDs during EEG study: None Technical aspects: This EEG study was done with scalp electrodes positioned according to the 10-20 International system of electrode placement. Electrical activity was reviewed with band pass filter of 1-_0 , sensitivity of 7 uV/mm, display speed of 71m/sec with a _1  notched filter applied as appropriate. EEG data were recorded continuously and digitally stored.  Video monitoring was available and reviewed as appropriate. Description: The posterior dominant rhythm consists of 8 Hz activity of moderate voltage (25-35 uV) seen predominantly in posterior head regions, symmetric and reactive to eye opening and eye closing. Sleep was characterized by vertex waves, sleep spindles (12 to 14 Hz), maximal frontocentral region. Hyperventilation and photic stimulation were not performed.   IMPRESSION: This study is within normal limits. No seizures or epileptiform discharges were seen throughout the recording. PLora Havens  MR BRAIN WO CONTRAST  Result Date: 10/28/2022 CLINICAL DATA:  Encephalopathy.  Altered mental status EXAM: MRI HEAD WITHOUT CONTRAST TECHNIQUE: Multiplanar, multiecho pulse sequences of the brain and surrounding structures were obtained without intravenous contrast. COMPARISON:  CT head 10/26/2022 FINDINGS: Brain: Negative for acute infarct.  Negative for hemorrhage or mass. Motion degraded study Generalized atrophy. Mild to moderate white matter changes with periventricular deep white matter hyperintensity on T2 bilaterally. Brainstem and cerebellum intact Vascular: Normal arterial flow  voids Skull and upper cervical spine: No focal skeletal lesion Sinuses/Orbits: Air-fluid levels in the sphenoid sinus. Remaining sinuses clear. Right cataract extraction Other: None IMPRESSION: 1. Negative for acute infarct. 2. Atrophy and mild to moderate chronic microvascular ischemic change in the white matter. 3. Air-fluid levels in the sphenoid sinus. Correlate with symptoms of sinusitis. Electronically Signed   By: CFranchot GalloM.D.   On: 10/28/2022 13:16   [place]RENAL  Result Date: 10/28/2022 CLINICAL DATA:  Acute kidney injury. EXAM: RENAL / URINARY TRACT ULTRASOUND COMPLETE COMPARISON:  CT abdomen pelvis dated June 21, 2021. FINDINGS: Right Kidney: Renal measurements: 8.0 x 4.3 x 4.1 cm = volume: 75 mL. Cortical thinning and increased echogenicity. No mass or hydronephrosis visualized. Left Kidney: Renal measurements: 10.3 x 5.1 x 4.4 cm = volume: 119 mL. Cortical thinning and increased echogenicity. No mass or hydronephrosis visualized. Bladder: 2.4 x 1.8 x 1.4 cm irregular lesion along the left bladder wall. Other: None. IMPRESSION: 1. Bilateral renal cortical thinning and increased echogenicity, consistent medical renal disease. 2. 2.4 cm irregular mass lesion along the left bladder wall, concerning for neoplasm. Correlation with urine cytology and cystoscopy is recommended. Electronically Signed   By: Titus Dubin M.D.   On: 10/28/2022 10:31   DG Abd 1 View  Result Date: 10/28/2022 CLINICAL DATA:  Abdominal pain. EXAM: ABDOMEN - 1 VIEW COMPARISON:  06/29/2021. FINDINGS: The bowel gas pattern is normal. Multiple surgical clips are present in the right upper quadrant and mid right abdomen. No renal calculi. Degenerative changes are noted in the lumbar spine. IMPRESSION: No evidence of bowel obstruction. Electronically Signed   By: Brett Fairy M.D.   On: 10/28/2022 00:22   CT Head Wo Contrast  Result Date: 10/26/2022 CLINICAL DATA:  Mental status change, unknown cause. Urinary tract  infection. EXAM: CT HEAD WITHOUT CONTRAST TECHNIQUE: Contiguous axial images were obtained from the base of the skull through the vertex without intravenous contrast. RADIATION DOSE REDUCTION: This exam was performed according to the departmental dose-optimization program which includes automated exposure control, adjustment of the mA and/or kV according to patient size and/or use of iterative reconstruction technique. COMPARISON:  CT head without contrast 09/27/2021 FINDINGS: Brain: Progressive moderate atrophy and diffuse white matter disease is present. Prominent white matter hypoattenuation is present posterior limb of the internal capsules bilaterally. Remote ischemic changes are again noted in the thalami. Streak artifact is present across the posterior fossa. No discrete lesions are evident. The ventricles are proportionate to the degree of atrophy. No significant extraaxial fluid collection is present. Vascular: No hyperdense vessel or unexpected calcification. Skull: Calvarium is intact. No focal lytic or blastic lesions are present. No significant extracranial soft tissue lesion is present. Sinuses/Orbits: The paranasal sinuses and mastoid air cells are clear. Fluid levels are present within the sphenoid sinuses bilaterally. The paranasal sinuses and mastoid air cells are otherwise clear. IMPRESSION: 1. Progressive moderate atrophy and diffuse white matter disease. This likely reflects the sequela of chronic microvascular ischemia. 2. Remote ischemic changes in the thalami bilaterally. 3. Fluid levels within the sphenoid sinuses bilaterally. This likely reflects acute sinusitis. Electronically Signed   By: San Morelle M.D.   On: 10/26/2022 17:15   DG Chest Portable 1 View  Result Date: 10/26/2022 CLINICAL DATA:  Altered mental status. EXAM: PORTABLE CHEST 1 VIEW COMPARISON:  One-view chest x-ray 09/27/2021 FINDINGS: Heart is mildly enlarged, stable. Atherosclerotic calcifications are again  noted at the aortic arch. Right perihilar patchy airspace opacities are present. Left basilar opacities are also present. The upper lung fields are clear. Visualized soft tissues and bony thorax are stable. IMPRESSION: Patchy right perihilar and left basilar airspace disease. While this may represent atelectasis, infection is not excluded. Electronically Signed   By: San Morelle M.D.   On: 10/26/2022 16:46     Discharge Exam: Vitals:   10/31/22 0436 10/31/22 0742  BP: (!) 142/78 (!) 141/57  Pulse: 84 82  Resp: 16 18  Temp: 98 F (36.7 C) 99.5 F (37.5 C)  SpO2: 99% 97%   Vitals:   10/30/22  1607 10/30/22 1928 10/31/22 0436 10/31/22 0742  BP: (!) 170/63 (!) 182/86 (!) 142/78 (!) 141/57  Pulse: 76 84 84 82  Resp: _0 Temp: 98.2 F (36.8 C) 98.9 F (37.2 C) 98 F (36.7 C) 99.5 F (37.5 C)  TempSrc: Oral Oral Oral Oral  SpO2: 94% 99% 99% 97%    General: Pt is alert, awake, not in acute distress Cardiovascular: RRR, S1/S2 +, no rubs, no gallops Respiratory: CTA bilaterally, no wheezing, no rhonchi Abdominal: Soft, NT, ND, bowel sounds + Extremities: no edema, no cyanosis    The results of significant diagnostics from this hospitalization (including imaging, microbiology, ancillary and laboratory) are listed below for reference.     Microbiology: Recent Results (from the past 240 hour(s))  Resp panel by RT-PCR (RSV, Flu A&B, Covid) Anterior Nasal Swab     Status: None   Collection Time: 10/26/22  5:32 PM   Specimen: Anterior Nasal Swab  Result Value Ref Range Status   SARS Coronavirus 2 by RT PCR NEGATIVE NEGATIVE Final    Comment: (NOTE) SARS-CoV-2 target nucleic acids are NOT DETECTED.  The SARS-CoV-2 RNA is generally detectable in upper respiratory specimens during the acute phase of infection. The lowest concentration of SARS-CoV-2 viral copies this assay can detect is 138 copies/mL. A negative result does not preclude SARS-Cov-2 infection and  should not be used as the sole basis for treatment or other patient management decisions. A negative result may occur with  improper specimen collection/handling, submission of specimen other than nasopharyngeal swab, presence of viral mutation(s) within the areas targeted by this assay, and inadequate number of viral copies(<138 copies/mL). A negative result must be combined with clinical observations, patient history, and epidemiological information. The expected result is Negative.  Fact Sheet for Patients:  EntrepreneurPulse.com.au  Fact Sheet for Healthcare Providers:  IncredibleEmployment.be  This test is no t yet approved or cleared by the [place] FDA and  has been authorized for detection and/or diagnosis of SARS-CoV-2 by FDA under an Emergency Use Authorization (EUA). This EUA will remain  in effect (meaning this test can be used) for the duration of the COVID-19 declaration under Section 564(b)(1) of the Act, 21 U.S.C.section 360bbb-3(b)(1), unless the authorization is terminated  or revoked sooner.       Influenza A by PCR NEGATIVE NEGATIVE Final   Influenza B by PCR NEGATIVE NEGATIVE Final    Comment: (NOTE) The Xpert Xpress SARS-CoV-2/FLU/RSV plus assay is intended as an aid in the diagnosis of influenza from Nasopharyngeal swab specimens and should not be used as a sole basis for treatment. Nasal washings and aspirates are unacceptable for Xpert Xpress SARS-CoV-2/FLU/RSV testing.  Fact Sheet for Patients: EntrepreneurPulse.com.au  Fact Sheet for Healthcare Providers: IncredibleEmployment.be  This test is not yet approved or cleared by the [place] FDA and has been authorized for detection and/or diagnosis of SARS-CoV-2 by FDA under an Emergency Use Authorization (EUA). This EUA will remain in effect (meaning this test can be used) for the duration of the COVID-19 declaration  under Section 564(b)(1) of the Act, 21 U.S.C. section 360bbb-3(b)(1), unless the authorization is terminated or revoked.     Resp Syncytial Virus by PCR NEGATIVE NEGATIVE Final    Comment: (NOTE) Fact Sheet for Patients: EntrepreneurPulse.com.au  Fact Sheet for Healthcare Providers: IncredibleEmployment.be  This test is not yet approved or cleared by the [place] FDA and has been authorized for detection and/or diagnosis of SARS-CoV-2 by FDA under an Emergency Use  Authorization (EUA). This EUA will remain in effect (meaning this test can be used) for the duration of the COVID-19 declaration under Section 564(b)(1) of the Act, 21 U.S.C. section 360bbb-3(b)(1), unless the authorization is terminated or revoked.  Performed at [facility], [place] [address], [place], [place] [address]   Urine Culture     Status: None   Collection Time: 10/27/22  8:20 AM   Specimen: Urine, Clean Catch  Result Value Ref Range Status   Specimen Description URINE, CLEAN CATCH  Final   Special Requests NONE  Final   Culture   Final    NO GROWTH Performed at [facility], [place] [address], [place], [place] [address]    Report Status 10/29/2022 FINAL  Final  MRSA Next Gen by PCR, Nasal     Status: Abnormal   Collection Time: 10/27/22  1:29 PM   Specimen: Nasal Mucosa; Nasal Swab  Result Value Ref Range Status   MRSA by PCR Next Gen DETECTED (A) NOT DETECTED Final    Comment: RESULT CALLED TO, READ BACK BY AND VERIFIED WITH:  C/ VALERIE O., RN 10/27/22 1728 A. LAFRANCE (NOTE) The GeneXpert MRSA Assay (FDA approved for NASAL specimens only), is one component of a comprehensive MRSA colonization surveillance program. It is not intended to diagnose MRSA infection nor to guide or monitor treatment for MRSA infections. Test performance is not FDA approved in patients less than 25 years old. Performed at [facility], [place] [address],  [place], [place] [address]      Labs: BNP (last 3 results) No results for input(s): "BNP" in the last 8760 hours. Basic Metabolic Panel: Recent Labs  Lab 10/26/22 2124 10/27/22 0430 10/28/22 0318 10/29/22 0320 10/30/22 0517 10/30/22 1239  NA  --  140 139 144 142 138  K  --  3.1* 3.1* 2.8* 4.3 4.2  CL  --  101 106 108 111 111  CO2  --  _0 GLUCOSE  --  66* 148* 147* 145* 135*  BUN  --  11 10 6* 6* 6*  CREATININE  --  1.85* 1.92* 1.69* 1.83* 1.82*  CALCIUM  --  8.4* 9.2 9.7 9.5 9.0  MG 1.9  --   --   --   --   --   PHOS 3.8  --   --   --   --   --    Liver Function Tests: Recent Labs  Lab 10/26/22 1634  AST 24  ALT 12  ALKPHOS 65  BILITOT 0.8  PROT 6.7  ALBUMIN 2.8*   No results for input(s): "LIPASE", "AMYLASE" in the last 168 hours. Recent Labs  Lab 10/26/22 2124  AMMONIA 11   CBC: Recent Labs  Lab 10/26/22 1634 10/27/22 0430 10/28/22 0318 10/29/22 0320  WBC 9.2 7.8 9.6 9.3  NEUTROABS 6.1  --   --   --   HGB 10.1* 8.9* 8.9* 10.1*  HCT 32.5* 28.1* 26.0* 30.7*  MCV 100.0 97.2 92.5 95.0  PLT 324 276 260 278   Cardiac Enzymes: No results for input(s): "CKTOTAL", "CKMB", "CKMBINDEX", "TROPONINI" in the last 168 hours. BNP: Invalid input(s): "POCBNP" CBG: Recent Labs  Lab 10/30/22 0433 10/30/22 0811 10/30/22 1137 10/30/22 1606 10/31/22 0741  GLUCAP 146* 136* 150* 111* 129*   D-Dimer No results for input(s): "DDIMER" in the last 72 hours. Hgb A1c No results for input(s): "HGBA1C" in the last 72 hours. Lipid Profile No results for  input(s): "CHOL", "HDL", "LDLCALC", "TRIG", "CHOLHDL", "LDLDIRECT" in the last 72 hours. Thyroid function studies No results for input(s): "TSH", "T4TOTAL", "T3FREE", "THYROIDAB" in the last 72 hours.  Invalid input(s): "FREET3" Anemia work up No results for input(s): "VITAMINB12", "FOLATE", "FERRITIN", "TIBC", "IRON", "RETICCTPCT" in the last 72 hours. Urinalysis    Component Value Date/Time   COLORURINE  YELLOW 10/26/2022 1854   APPEARANCEUR HAZY (A) 10/26/2022 1854   LABSPEC 1.009 10/26/2022 1854   PHURINE 5.0 10/26/2022 1854   GLUCOSEU NEGATIVE 10/26/2022 1854   HGBUR SMALL (A) 10/26/2022 1854   BILIRUBINUR NEGATIVE 10/26/2022 1854   BILIRUBINUR negative 12/24/2020 1008   BILIRUBINUR neg 07/23/2020 1152   KETONESUR 5 (A) 10/26/2022 1854   PROTEINUR NEGATIVE 10/26/2022 1854   UROBILINOGEN 0.2 12/24/2020 1008   NITRITE NEGATIVE 10/26/2022 1854   LEUKOCYTESUR SMALL (A) 10/26/2022 1854   Sepsis Labs Recent Labs  Lab 10/26/22 1634 10/27/22 0430 10/28/22 0318 10/29/22 0320  WBC 9.2 7.8 9.6 9.3   Microbiology Recent Results (from the past 240 hour(s))  Resp panel by RT-PCR (RSV, Flu A&B, Covid) Anterior Nasal Swab     Status: None   Collection Time: 10/26/22  5:32 PM   Specimen: Anterior Nasal Swab  Result Value Ref Range Status   SARS Coronavirus 2 by RT PCR NEGATIVE NEGATIVE Final    Comment: (NOTE) SARS-CoV-2 target nucleic acids are NOT DETECTED.  The SARS-CoV-2 RNA is generally detectable in upper respiratory specimens during the acute phase of infection. The lowest concentration of SARS-CoV-2 viral copies this assay can detect is 138 copies/mL. A negative result does not preclude SARS-Cov-2 infection and should not be used as the sole basis for treatment or other patient management decisions. A negative result may occur with  improper specimen collection/handling, submission of specimen other than nasopharyngeal swab, presence of viral mutation(s) within the areas targeted by this assay, and inadequate number of viral copies(<138 copies/mL). A negative result must be combined with clinical observations, patient history, and epidemiological information. The expected result is Negative.  Fact Sheet for Patients:  EntrepreneurPulse.com.au  Fact Sheet for Healthcare Providers:  IncredibleEmployment.be  This test is no t yet approved  or cleared by the [place] FDA and  has been authorized for detection and/or diagnosis of SARS-CoV-2 by FDA under an Emergency Use Authorization (EUA). This EUA will remain  in effect (meaning this test can be used) for the duration of the COVID-19 declaration under Section 564(b)(1) of the Act, 21 U.S.C.section 360bbb-3(b)(1), unless the authorization is terminated  or revoked sooner.       Influenza A by PCR NEGATIVE NEGATIVE Final   Influenza B by PCR NEGATIVE NEGATIVE Final    Comment: (NOTE) The Xpert Xpress SARS-CoV-2/FLU/RSV plus assay is intended as an aid in the diagnosis of influenza from Nasopharyngeal swab specimens and should not be used as a sole basis for treatment. Nasal washings and aspirates are unacceptable for Xpert Xpress SARS-CoV-2/FLU/RSV testing.  Fact Sheet for Patients: EntrepreneurPulse.com.au  Fact Sheet for Healthcare Providers: IncredibleEmployment.be  This test is not yet approved or cleared by the [place] FDA and has been authorized for detection and/or diagnosis of SARS-CoV-2 by FDA under an Emergency Use Authorization (EUA). This EUA will remain in effect (meaning this test can be used) for the duration of the COVID-19 declaration under Section 564(b)(1) of the Act, 21 U.S.C. section 360bbb-3(b)(1), unless the authorization is terminated or revoked.     Resp Syncytial Virus by PCR NEGATIVE NEGATIVE Final  Comment: (NOTE) Fact Sheet for Patients: [employer]  Fact Sheet for Healthcare Providers: IncredibleEmployment.be  This test is not yet approved or cleared by the [place] FDA and has been authorized for detection and/or diagnosis of SARS-CoV-2 by FDA under an Emergency Use Authorization (EUA). This EUA will remain in effect (meaning this test can be used) for the duration of the COVID-19 declaration under Section 564(b)(1) of the Act,  21 U.S.C. section 360bbb-3(b)(1), unless the authorization is terminated or revoked.  Performed at [facility], [place] [address], [place], [place] [address]   Urine Culture     Status: None   Collection Time: 10/27/22  8:20 AM   Specimen: Urine, Clean Catch  Result Value Ref Range Status   Specimen Description URINE, CLEAN CATCH  Final   Special Requests NONE  Final   Culture   Final    NO GROWTH Performed at [facility], [place] [address], [place], [place] [address]    Report Status 10/29/2022 FINAL  Final  MRSA Next Gen by PCR, Nasal     Status: Abnormal   Collection Time: 10/27/22  1:29 PM   Specimen: Nasal Mucosa; Nasal Swab  Result Value Ref Range Status   MRSA by PCR Next Gen DETECTED (A) NOT DETECTED Final    Comment: RESULT CALLED TO, READ BACK BY AND VERIFIED WITH:  C/ VALERIE O., RN 10/27/22 1728 A. LAFRANCE (NOTE) The GeneXpert MRSA Assay (FDA approved for NASAL specimens only), is one component of a comprehensive MRSA colonization surveillance program. It is not intended to diagnose MRSA infection nor to guide or monitor treatment for MRSA infections. Test performance is not FDA approved in patients less than 31 years old. Performed at [facility], [place] [address], [place],  [address]      Time coordinating discharge: Over 30 minutes  SIGNED:   Darliss Cheney, MD  [facility] 10/31/2022, 9:15 AM *Please note that this is a verbal dictation therefore any spelling or grammatical errors are due to the "[place] [employer]" system interpretation. If 7PM-7AM, please contact night-coverage [email or web address]

## 2022-10-31 NOTE — Care Management Important Message (Signed)
Important Message  Patient Details  Name: Cindy Gordon MRN: [id number] Date of Birth: 11-02-1941   Medicare Important Message Given:  Yes   Patient left prior to IM delivery will mail to the patient home address.   Zahmir Lalla 10/31/2022, 4:20 PM

## 2022-11-04 ENCOUNTER — Telehealth: Payer: Self-pay

## 2022-11-04 NOTE — Telephone Encounter (Signed)
Transition Care Management Unsuccessful Follow-up Telephone Call  Date of discharge and from where: 10/26/22 from [facility]  Attempts:  1st Attempt  Reason for unsuccessful TCM follow-up call:  Left voice message

## 2022-11-07 ENCOUNTER — Telehealth: Payer: Self-pay

## 2022-11-07 NOTE — Telephone Encounter (Signed)
Transition Care Management Unsuccessful Follow-up Telephone Call  Date of discharge and from where:    Attempts:  2nd Attempt  Reason for unsuccessful TCM follow-up call:    Spoke with patients son, Cindy Gordon. He states [name] is in a nursing home and does not want to complete questionnaire.

## 2022-11-12 ENCOUNTER — Other Ambulatory Visit: Payer: Self-pay | Admitting: Urology

## 2022-11-19 ENCOUNTER — Encounter (HOSPITAL_COMMUNITY): Payer: Self-pay | Admitting: Urology

## 2022-11-19 NOTE — Progress Notes (Addendum)
Anesthesia Review:  PCP: DR Aniceto Boss - sees pt at [facility] visit on 10/31/22 - on chart.  Cardiologist : none  Chest x-ray : 10/26/22  EKG : 10/26/22  Echo : Stress test: Cardiac Cath :  Activity level:  Sleep Study/ CPAP : sleep apnea in hx no mention of cpap in hx  Fasting Blood Sugar :      / Checks Blood Sugar -- times a day:   Blood Thinner/ Instructions /Last Dose: ASA / Instructions/ Last Dose :    MRSA pcr- positive on 10/27/22  Most recent lab qwork on 11/07/22 at [facility] on 11/19/22 - On chart- Creeatinine- 2.20 , hgb- 9.7 30.1- hct - CBC/Diff and CMP on chart  Admitted on 10/26/22 and discharged on 10/31/22 back to [facility] at [facility]   DM- type 2  Memory loss Stage 4 CKD  Son- Nieshia Larmon- POA- [phone] Spoke with Andee Poles, nurse at [facility] on 11/19/22 she is to fax current [facility],, demographic face sheet , most recent labs and ov note and med hx  Med hx on chart and demographics received on 11/19/22 and MAR on chart.   Med hx reviewed along with current MAR and completed in epic.

## 2022-11-19 NOTE — Progress Notes (Signed)
Preop instructions for:    Cindy Gordon                       Date of Birth   05-21-1941                           Date of Procedure 12/08/2022        Doctor: Link Snuffer  Time to arrive at [facility]: 0830 am  Report to: Admitting  Procedure: Transurethral resection of Bladder Tumor retrograde pyelogram with possible Gemcitabine    Do not eat or drink past midnight the night before your procedure.(To include any tube feedings-must be discontinued)    Take these morning medications only with sips of water.(or give through gastrostomy or feeding tube). Amlodipine, Levothyroxine, Pantoprazole, Namenda, Savella  Note: No Insulin or Diabetic meds should be given or taken the morning of the procedure!   Facility contact:  [facility]:    [place]: son- Cindy Gordon- [phone]  Transportation contact phone#: [facility] [phone]  Please send day of procedure:current med list and meds last taken that day, confirm nothing by mouth status from what time, Patient Demographic info( to include DNR status, problem list, allergies)   RN contact name/phone#:                             and Fax #: [phone]  Bring Insurance card and picture ID Leave all jewelry and other valuables at place where living( no metal or rings to be worn) No contact lens Women-no make-up, no lotions,perfumes,powders Men-no colognes,lotions  Any questions day of procedure,call  [facility]AY-[phone]   Sent from :[facility]                   [place]                   [phone]  Sent by :    Cindy Shields            RN

## 2022-12-08 ENCOUNTER — Ambulatory Visit (HOSPITAL_BASED_OUTPATIENT_CLINIC_OR_DEPARTMENT_OTHER): Payer: Medicare Other | Admitting: Physician Assistant

## 2022-12-08 ENCOUNTER — Encounter (HOSPITAL_COMMUNITY): Payer: Self-pay | Admitting: Urology

## 2022-12-08 ENCOUNTER — Other Ambulatory Visit: Payer: Self-pay

## 2022-12-08 ENCOUNTER — Encounter (HOSPITAL_COMMUNITY)
Admission: RE | Disposition: A | Discharge status: Home / Self Care | Payer: Self-pay | Source: Home / Self Care | Attending: Urology

## 2022-12-08 ENCOUNTER — Ambulatory Visit (HOSPITAL_COMMUNITY)
Admission: RE | Admit: 2022-12-08 | Discharge: 2022-12-08 | Disposition: A | Discharge status: Home / Self Care | Payer: Medicare Other | Attending: Urology | Admitting: Urology

## 2022-12-08 ENCOUNTER — Ambulatory Visit (HOSPITAL_COMMUNITY): Payer: Medicare Other

## 2022-12-08 ENCOUNTER — Ambulatory Visit (HOSPITAL_COMMUNITY): Payer: Medicare Other | Admitting: Physician Assistant

## 2022-12-08 DIAGNOSIS — E039 Hypothyroidism, unspecified: Secondary | ICD-10-CM | POA: Diagnosis not present

## 2022-12-08 DIAGNOSIS — G4730 Sleep apnea, unspecified: Secondary | ICD-10-CM | POA: Diagnosis not present

## 2022-12-08 DIAGNOSIS — K219 Gastro-esophageal reflux disease without esophagitis: Secondary | ICD-10-CM | POA: Insufficient documentation

## 2022-12-08 DIAGNOSIS — Z01818 Encounter for other preprocedural examination: Secondary | ICD-10-CM

## 2022-12-08 DIAGNOSIS — D630 Anemia in neoplastic disease: Secondary | ICD-10-CM | POA: Diagnosis not present

## 2022-12-08 DIAGNOSIS — D638 Anemia in other chronic diseases classified elsewhere: Secondary | ICD-10-CM

## 2022-12-08 DIAGNOSIS — I129 Hypertensive chronic kidney disease with stage 1 through stage 4 chronic kidney disease, or unspecified chronic kidney disease: Secondary | ICD-10-CM | POA: Insufficient documentation

## 2022-12-08 DIAGNOSIS — E1122 Type 2 diabetes mellitus with diabetic chronic kidney disease: Secondary | ICD-10-CM | POA: Insufficient documentation

## 2022-12-08 DIAGNOSIS — D494 Neoplasm of unspecified behavior of bladder: Secondary | ICD-10-CM

## 2022-12-08 DIAGNOSIS — N189 Chronic kidney disease, unspecified: Secondary | ICD-10-CM | POA: Diagnosis not present

## 2022-12-08 DIAGNOSIS — I10 Essential (primary) hypertension: Secondary | ICD-10-CM | POA: Diagnosis not present

## 2022-12-08 DIAGNOSIS — R310 Gross hematuria: Secondary | ICD-10-CM | POA: Diagnosis not present

## 2022-12-08 HISTORY — DX: Depression, unspecified: F32.A

## 2022-12-08 HISTORY — DX: Long term (current) use of insulin: Z79.4

## 2022-12-08 HISTORY — DX: Encephalopathy, unspecified: G93.40

## 2022-12-08 HISTORY — DX: Dementia in other diseases classified elsewhere, unspecified severity, without behavioral disturbance, psychotic disturbance, mood disturbance, and anxiety: F02.80

## 2022-12-08 HISTORY — DX: Acute kidney failure, unspecified: N17.9

## 2022-12-08 HISTORY — DX: Acidosis, unspecified: E87.20

## 2022-12-08 HISTORY — PX: TRANSURETHRAL RESECTION OF BLADDER TUMOR WITH MITOMYCIN-C: SHX6459

## 2022-12-08 HISTORY — DX: Hypokalemia: E87.6

## 2022-12-08 HISTORY — DX: Personal history of urinary (tract) infections: Z87.440

## 2022-12-08 HISTORY — DX: Obesity, unspecified: E66.9

## 2022-12-08 HISTORY — DX: Other chronic pain: G89.29

## 2022-12-08 HISTORY — DX: Long term (current) use of anticoagulants: Z79.01

## 2022-12-08 HISTORY — DX: Unspecified dementia, unspecified severity, without behavioral disturbance, psychotic disturbance, mood disturbance, and anxiety: F03.90

## 2022-12-08 HISTORY — DX: Polyneuropathy, unspecified: G62.9

## 2022-12-08 HISTORY — DX: Chronic kidney disease, unspecified: N18.9

## 2022-12-08 HISTORY — DX: Constipation, unspecified: K59.00

## 2022-12-08 HISTORY — DX: Unspecified severe protein-calorie malnutrition: E43

## 2022-12-08 HISTORY — DX: Hematuria, unspecified: R31.9

## 2022-12-08 HISTORY — DX: Anxiety disorder, unspecified: F41.9

## 2022-12-08 HISTORY — DX: Atherosclerotic heart disease of native coronary artery without angina pectoris: I25.10

## 2022-12-08 HISTORY — DX: Neuromuscular dysfunction of bladder, unspecified: N31.9

## 2022-12-08 HISTORY — DX: Iron deficiency: E61.1

## 2022-12-08 LAB — BASIC METABOLIC PANEL
Anion gap: 6 (ref 5–15)
BUN: 31 mg/dL — ABNORMAL HIGH (ref 8–23)
CO2: 22 mmol/L (ref 22–32)
Calcium: 9.5 mg/dL (ref 8.9–10.3)
Chloride: 109 mmol/L (ref 98–111)
Creatinine, Ser: 2.34 mg/dL — ABNORMAL HIGH (ref 0.44–1.00)
GFR, Estimated: 20 mL/min — ABNORMAL LOW (ref >60)
Glucose, Bld: 128 mg/dL — ABNORMAL HIGH (ref 70–99)
Potassium: 4.9 mmol/L (ref 3.5–5.1)
Sodium: 137 mmol/L (ref 135–145)

## 2022-12-08 LAB — HEMOGLOBIN A1C
Hgb A1c MFr Bld: 5.2 % (ref 4.8–5.6)
Mean Plasma Glucose: 102.54 mg/dL

## 2022-12-08 LAB — CBC
HCT: 31.3 % — ABNORMAL LOW (ref 36.0–46.0)
Hemoglobin: 9.7 g/dL — ABNORMAL LOW (ref 12.0–15.0)
MCH: 31.1 pg (ref 26.0–34.0)
MCHC: 31 g/dL (ref 30.0–36.0)
MCV: 100.3 fL — ABNORMAL HIGH (ref 80.0–100.0)
Platelets: 373 10*3/uL (ref 150–400)
RBC: 3.12 MIL/uL — ABNORMAL LOW (ref 3.87–5.11)
RDW: 15.9 % — ABNORMAL HIGH (ref 11.5–15.5)
WBC: 8.5 10*3/uL (ref 4.0–10.5)
nRBC: 0 % (ref 0.0–0.2)

## 2022-12-08 LAB — GLUCOSE, CAPILLARY
Glucose-Capillary: 134 mg/dL — ABNORMAL HIGH (ref 70–99)
Glucose-Capillary: 117 mg/dL — ABNORMAL HIGH (ref 70–99)

## 2022-12-08 SURGERY — TRANSURETHRAL RESECTION OF BLADDER TUMOR WITH MITOMYCIN-C
Anesthesia: General | Laterality: Bilateral

## 2022-12-08 MED ORDER — 0.9 % SODIUM CHLORIDE (POUR BTL) OPTIME
TOPICAL | Status: DC | PRN
  Administered 2022-12-08: 1000 mL

## 2022-12-08 MED ORDER — LABETALOL HCL 5 MG/ML IV SOLN
5.0000 mg | Freq: Once | INTRAVENOUS | Status: AC
  Administered 2022-12-08: 5 mg via INTRAVENOUS

## 2022-12-08 MED ORDER — ONDANSETRON HCL 4 MG/2ML IJ SOLN
INTRAMUSCULAR | Status: DC | PRN
  Administered 2022-12-08: 4 mg via INTRAVENOUS

## 2022-12-08 MED ORDER — AMISULPRIDE (ANTIEMETIC) 5 MG/2ML IV SOLN: 10.0000 mg | Freq: Once | INTRAVENOUS | Status: DC | PRN

## 2022-12-08 MED ORDER — IOHEXOL 300 MG/ML  SOLN
INTRAMUSCULAR | Status: DC | PRN
  Administered 2022-12-08: 32 mL

## 2022-12-08 MED ORDER — LIDOCAINE HCL (PF) 2 % IJ SOLN
INTRAMUSCULAR | Status: AC
  Filled 2022-12-08: qty 5

## 2022-12-08 MED ORDER — SODIUM CHLORIDE 0.9 % IR SOLN
Status: DC | PRN
  Administered 2022-12-08 (×3): 3000 mL via INTRAVESICAL

## 2022-12-08 MED ORDER — PHENYLEPHRINE HCL (PRESSORS) 10 MG/ML IV SOLN
INTRAVENOUS | Status: DC | PRN
  Administered 2022-12-08 (×2): 80 ug via INTRAVENOUS
  Administered 2022-12-08: 120 ug via INTRAVENOUS
  Administered 2022-12-08 (×5): 80 ug via INTRAVENOUS

## 2022-12-08 MED ORDER — LABETALOL HCL 5 MG/ML IV SOLN
INTRAVENOUS | Status: AC
  Filled 2022-12-08: qty 4

## 2022-12-08 MED ORDER — ONDANSETRON HCL 4 MG/2ML IJ SOLN: 4.0000 mg | Freq: Once | INTRAMUSCULAR | Status: DC | PRN

## 2022-12-08 MED ORDER — ROCURONIUM BROMIDE 100 MG/10ML IV SOLN
INTRAVENOUS | Status: DC | PRN
  Administered 2022-12-08: 30 mg via INTRAVENOUS
  Administered 2022-12-08: 20 mg via INTRAVENOUS

## 2022-12-08 MED ORDER — PROPOFOL 10 MG/ML IV BOLUS
INTRAVENOUS | Status: AC
  Filled 2022-12-08: qty 20

## 2022-12-08 MED ORDER — CIPROFLOXACIN IN D5W 400 MG/200ML IV SOLN
400.0000 mg | Freq: Two times a day (BID) | INTRAVENOUS | Status: DC
  Administered 2022-12-08: 400 mg via INTRAVENOUS
  Filled 2022-12-08: qty 200

## 2022-12-08 MED ORDER — FENTANYL CITRATE (PF) 100 MCG/2ML IJ SOLN
INTRAMUSCULAR | Status: AC
  Filled 2022-12-08: qty 2

## 2022-12-08 MED ORDER — ACETAMINOPHEN 10 MG/ML IV SOLN
1000.0000 mg | Freq: Once | INTRAVENOUS | Status: DC | PRN
  Administered 2022-12-08: 1000 mg via INTRAVENOUS

## 2022-12-08 MED ORDER — DEXAMETHASONE SODIUM PHOSPHATE 10 MG/ML IJ SOLN
INTRAMUSCULAR | Status: AC
  Filled 2022-12-08: qty 1

## 2022-12-08 MED ORDER — FENTANYL CITRATE PF 50 MCG/ML IJ SOSY: 25.0000 ug | PREFILLED_SYRINGE | INTRAMUSCULAR | Status: DC | PRN

## 2022-12-08 MED ORDER — ORAL CARE MOUTH RINSE: 15.0000 mL | Freq: Once | OROMUCOSAL | Status: AC

## 2022-12-08 MED ORDER — FENTANYL CITRATE (PF) 100 MCG/2ML IJ SOLN
INTRAMUSCULAR | Status: DC | PRN
  Administered 2022-12-08: 50 ug via INTRAVENOUS
  Administered 2022-12-08 (×2): 25 ug via INTRAVENOUS

## 2022-12-08 MED ORDER — SUGAMMADEX SODIUM 200 MG/2ML IV SOLN
INTRAVENOUS | Status: DC | PRN
  Administered 2022-12-08: 200 mg via INTRAVENOUS

## 2022-12-08 MED ORDER — CHLORHEXIDINE GLUCONATE 0.12 % MT SOLN
15.0000 mL | Freq: Once | OROMUCOSAL | Status: AC
  Administered 2022-12-08: 15 mL via OROMUCOSAL

## 2022-12-08 MED ORDER — ONDANSETRON HCL 4 MG/2ML IJ SOLN
INTRAMUSCULAR | Status: AC
  Filled 2022-12-08: qty 2

## 2022-12-08 MED ORDER — LIDOCAINE HCL (CARDIAC) PF 100 MG/5ML IV SOSY
PREFILLED_SYRINGE | INTRAVENOUS | Status: DC | PRN
  Administered 2022-12-08: 60 mg via INTRAVENOUS

## 2022-12-08 MED ORDER — PROPOFOL 10 MG/ML IV BOLUS
INTRAVENOUS | Status: DC | PRN
  Administered 2022-12-08: 100 mg via INTRAVENOUS

## 2022-12-08 MED ORDER — ROCURONIUM BROMIDE 10 MG/ML (PF) SYRINGE
PREFILLED_SYRINGE | INTRAVENOUS | Status: AC
  Filled 2022-12-08: qty 10

## 2022-12-08 MED ORDER — DEXAMETHASONE SODIUM PHOSPHATE 10 MG/ML IJ SOLN
INTRAMUSCULAR | Status: DC | PRN
  Administered 2022-12-08: 5 mg via INTRAVENOUS

## 2022-12-08 MED ORDER — LACTATED RINGERS IV SOLN: INTRAVENOUS | Status: DC

## 2022-12-08 MED ORDER — ACETAMINOPHEN 10 MG/ML IV SOLN
INTRAVENOUS | Status: AC
  Filled 2022-12-08: qty 100

## 2022-12-08 MED ORDER — GEMCITABINE CHEMO FOR BLADDER INSTILLATION 2000 MG: 2000.0000 mg | Freq: Once | INTRAVENOUS | Status: DC

## 2022-12-08 SURGICAL SUPPLY — 19 items
BAG URINE DRAIN 2000ML AR STRL (UROLOGICAL SUPPLIES) IMPLANT
BAG URO CATCHER STRL LF (MISCELLANEOUS) ×1 IMPLANT
CATH FOLEY 2WAY SLVR  5CC 18FR (CATHETERS)
CATH FOLEY 2WAY SLVR 5CC 18FR (CATHETERS) IMPLANT
DRAPE FOOT SWITCH (DRAPES) ×1 IMPLANT
ELECT REM PT RETURN 15FT ADLT (MISCELLANEOUS) ×1 IMPLANT
GLOVE BIO SURGEON STRL SZ7.5 (GLOVE) ×1 IMPLANT
GOWN STRL REUS W/ TWL XL LVL3 (GOWN DISPOSABLE) ×1 IMPLANT
GOWN STRL REUS W/TWL XL LVL3 (GOWN DISPOSABLE) ×1
GUIDEWIRE STR DUAL SENSOR (WIRE) IMPLANT
KIT TURNOVER KIT A (KITS) IMPLANT
LOOP CUT BIPOLAR 24F LRG (ELECTROSURGICAL) IMPLANT
MANIFOLD NEPTUNE II (INSTRUMENTS) ×1 IMPLANT
PACK CYSTO (CUSTOM PROCEDURE TRAY) ×1 IMPLANT
PLUG CATH AND CAP STER (CATHETERS) IMPLANT
SYR TOOMEY IRRIG 70ML (MISCELLANEOUS)
SYRINGE TOOMEY IRRIG 70ML (MISCELLANEOUS) IMPLANT
TUBING CONNECTING 10 (TUBING) ×1 IMPLANT
TUBING UROLOGY SET (TUBING) ×1 IMPLANT

## 2022-12-08 NOTE — H&P (Signed)
CC/HPI: Patient was seen in consultation while hospitalized. She had gross hematuria and Foley catheter was placed. This resolved with continuous bladder irrigation. She presents for cystoscopy for completion of hematuria workup.   11/11/2022  Patient with a history of gross hematuria. Underwent negative hematuria workup and cystoscopy was negative when she was last seen in 2022. She was then recently admitted to the hospital with acute renal insufficiency and confusion. And underwent renal ultrasound that revealed a 2.4 cm irregular mass on the left bladder wall concerning for neoplasm. She had no hydronephrosis of the kidneys. Creatinine was 1.83 with a GFR of 27. She is unable to leave urine sample today.     ALLERGIES: None   MEDICATIONS: Aspirin 81 mg tablet,chewable  Acetaminophen  Amlodipine Besylate 5 mg tablet  Amoxicillin 500 mg tablet  Ascorbic Acid  Atorvastatin Calcium 10 mg tablet  Biotin  Cholecalciferol  Ferrous Sulfate  Folic Acid  Guaifenesin Er  Levothyroxine Sodium 75 mcg capsule  Multivitamin  Namenda  Ondansetron Hcl 4 mg tablet  Pantoprazole Sodium 40 mg tablet, delayed release  Potassium Chloride  Pregabalin  Savella  Senna-S  Vitamin B12     GU PSH: Cystoscopy - 07/03/2021     NON-GU PSH: None       GU PMH: Gross hematuria - 07/03/2021    NON-GU PMH: Depression Diabetes Type 2 GERD Hypertension Hypothyroidism    FAMILY HISTORY: Hypertension - Runs in Family   SOCIAL HISTORY: None   REVIEW OF SYSTEMS:    GU Review Female:  Patient denies frequent urination, hard to postpone urination, burning /pain with urination, get up at night to urinate, leakage of urine, stream starts and stops, trouble starting your stream, have to strain to urinate, and being pregnant.   Gastrointestinal (Upper):  Patient denies nausea, vomiting, and indigestion/ heartburn.   Gastrointestinal (Lower):  Patient denies constipation and diarrhea.   Constitutional:  Patient  denies fever, night sweats, weight loss, and fatigue.   Skin:  Patient denies skin rash/ lesion and itching.   Eyes:  Patient denies blurred vision and double vision.   Ears/ Nose/ Throat:  Patient denies sore throat and sinus problems.   Hematologic/Lymphatic:  Patient denies swollen glands and easy bruising.   Cardiovascular:  Patient denies leg swelling and chest pains.   Respiratory:  Patient denies cough and shortness of breath.   Endocrine:  Patient denies excessive thirst.   Musculoskeletal:  Patient denies back pain and joint pain.   Neurological:  Patient denies headaches and dizziness.   Psychologic:  Patient denies depression and anxiety.   VITAL SIGNS: None   MULTI-SYSTEM PHYSICAL EXAMINATION:    Constitutional: Elderly female in a wheelchair in no acute distress        Complexity of Data:   Source Of History:  Patient  Lab Test Review:  BMP  Records Review:  Previous Doctor Records, Previous Patient Records  X-Ray Review: Renal Ultrasound: Reviewed Films. Reviewed Report. Discussed With Patient.     PROCEDURES: None   ASSESSMENT:     ICD-10 Details  1 GU:  Gross hematuria - R31.0 Undiagnosed New Problem  2  Bladder tumor/neoplasm - D41.4 Undiagnosed New Problem   PLAN:   Orders  X-Rays: Outside CT Without I.V. Contrast - CT of the abdomen and pelvis without contrast  Schedule  Return Visit/Planned Activity: Next Available Appointment - Outside CT, Outside Radiology  Document  Letter(s):  Created for Patient: Clinical Summary   Notes:  Obtain CT of  the abdomen and pelvis without contrast. No IV contrast given her creatinine of 1.82 and GFR of 27.   She is unable to stand for cystoscopy so not able to perform in office. Recommend cystoscopy with transurethral resection of bladder tumor with bilateral retrograde pyelogram in the operating room. Risk and benefits discussed including but not limited to bleeding, infection, injury to surrounding structures including  potential for bladder perforation, need for additional procedures.   Signed by Link Snuffer, III, M.D. on 11/11/22 at 3:29 PM (EST

## 2022-12-08 NOTE — Discharge Instructions (Addendum)

## 2022-12-08 NOTE — Transfer of Care (Signed)
Immediate Anesthesia Transfer of Care Note  Patient: Cindy Gordon  Procedure(s) Performed: TRANSURETHRAL RESECTION OF BLADDER TUMOR BILATERAL RETROGRADE PYELOGRAM (Bilateral)  Patient Location: PACU  Anesthesia Type:General  Level of Consciousness: awake, alert , oriented, and patient cooperative  Airway & Oxygen Therapy: Patient Spontanous Breathing and Patient connected to face mask oxygen  Post-op Assessment: Report given to RN and Post -op Vital signs reviewed and stable  Post vital signs: Reviewed and stable  Last Vitals:  Vitals Value Taken Time  BP 178/68 12/08/22 1239  Temp    Pulse 68 12/08/22 1240  Resp 14 12/08/22 1240  SpO2 100 % 12/08/22 1240  Vitals shown include unvalidated device data.  Last Pain:  Vitals:   12/08/22 0849  TempSrc: Oral      Patients Stated Pain Goal: 0 (31/54/00 8676)  Complications: No notable events documented.

## 2022-12-08 NOTE — Anesthesia Postprocedure Evaluation (Signed)
Anesthesia Post Note  Patient: Shlonda Dolloff  Procedure(s) Performed: TRANSURETHRAL RESECTION OF BLADDER TUMOR BILATERAL RETROGRADE PYELOGRAM (Bilateral)     Patient location during evaluation: PACU Anesthesia Type: General Level of consciousness: awake Pain management: pain level controlled Vital Signs Assessment: post-procedure vital signs reviewed and stable Respiratory status: spontaneous breathing, nonlabored ventilation and respiratory function stable Cardiovascular status: blood pressure returned to baseline and stable Postop Assessment: no apparent nausea or vomiting Anesthetic complications: no   No notable events documented.  Last Vitals:  Vitals:   12/08/22 1415 12/08/22 1430  BP: (!) 133/55 130/89  Pulse: (!) 55 (!) 56  Resp: 14   Temp:    SpO2: 97% 98%    Last Pain:  Vitals:   12/08/22 0849  TempSrc: Oral                 Saverio Kader P Melo Stauber

## 2022-12-08 NOTE — Anesthesia Procedure Notes (Signed)
Procedure Name: Intubation Date/Time: 12/08/2022 11:16 AM  Performed by: Randye Lobo, CRNAPre-anesthesia Checklist: Patient identified, Emergency Drugs available, Suction available and Patient being monitored Patient Re-evaluated:Patient Re-evaluated prior to induction Oxygen Delivery Method: Circle System Utilized Preoxygenation: Pre-oxygenation with 100% oxygen Induction Type: IV induction Ventilation: Mask ventilation without difficulty Laryngoscope Size: Mac and 3 Grade View: Grade I Tube type: Oral Tube size: 7.0 mm Number of attempts: 1 Airway Equipment and Method: Stylet and Oral airway Placement Confirmation: ETT inserted through vocal cords under direct vision, positive ETCO2 and breath sounds checked- equal and bilateral Secured at: 22 cm Tube secured with: Tape Dental Injury: Teeth and Oropharynx as per pre-operative assessment

## 2022-12-08 NOTE — Anesthesia Preprocedure Evaluation (Addendum)
Anesthesia Evaluation  Patient identified by MRN, date of birth, ID band Patient awake    Reviewed: Allergy & Precautions, NPO status , Patient's Chart, lab work & pertinent test results  Airway Mallampati: III  TM Distance: >3 FB Neck ROM: Full    Dental  (+) Upper Dentures   Pulmonary sleep apnea    Pulmonary exam normal        Cardiovascular hypertension, Pt. on medications Normal cardiovascular exam     Neuro/Psych  PSYCHIATRIC DISORDERS Anxiety Depression   Dementia negative neurological ROS     GI/Hepatic Neg liver ROS,GERD  Medicated and Controlled,,  Endo/Other  diabetesHypothyroidism    Renal/GU Renal disease     Musculoskeletal  (+)  Fibromyalgia -Pressure injury of skin Wheel-chair bound   Abdominal   Peds  Hematology  (+) Blood dyscrasia, anemia   Anesthesia Other Findings BLADDER TUMOR  Reproductive/Obstetrics                             Anesthesia Physical Anesthesia Plan  ASA: 3  Anesthesia Plan: General   Post-op Pain Management:    Induction: Intravenous  PONV Risk Score and Plan: 3 and Ondansetron, Dexamethasone and Treatment may vary due to age or medical condition  Airway Management Planned: Oral ETT  Additional Equipment:   Intra-op Plan:   Post-operative Plan: Extubation in OR  Informed Consent: I have reviewed the patients History and Physical, chart, labs and discussed the procedure including the risks, benefits and alternatives for the proposed anesthesia with the patient or authorized representative who has indicated his/her understanding and acceptance.     Dental advisory given  Plan Discussed with: CRNA  Anesthesia Plan Comments:        Anesthesia Quick Evaluation

## 2022-12-08 NOTE — Op Note (Signed)
Operative Note  Preoperative diagnosis:  1.  Bladder tumor  Postoperative diagnosis: 1.  Bladder tumor--large  Procedure(s): 1.  Cystoscopy with bilateral retrograde pyelogram 2.  Transurethral resection of bladder tumor--large  Surgeon: Link Snuffer, MD  Assistants: None  Anesthesia: General  Complications: None immediate  EBL: Minimal  Specimens: 1.  Bladder tumor  Drains/Catheters: 1.  None  Intraoperative findings: 1.  Normal urethra 2.  Bilateral ureteral orifices were little bit inverted and difficult to access.  Able to pass a wire up each side and retrograde pyelogram was performed that showed some fullness in the kidney and ureter with good drainage post retrograde pyelogram.  No evidence of obstructing lesion, stone, or filling defect bilaterally. 3.  On the left lateral wall, she had some raised mucosa there was irregular and white.  Had an area of demarcation circumferentially between normal bladder tissue and the abnormal tissue.  All the abnormal tissue was resected and sent for specimen.  Did not have the typical appearance of urothelial cell carcinoma.  Indication: 82 year old female with possible bladder tumor on bladder ultrasound presents for previously mentioned operation.  Description of procedure:  The patient was identified and consent was obtained.  The patient was taken to the operating room and placed in the supine position.  The patient was placed under general anesthesia.  Perioperative antibiotics were administered.  The patient was placed in dorsal lithotomy.  Patient was prepped and draped in a standard sterile fashion and a timeout was performed.  A 21 French rigid cystoscope was advanced into the urethra and into the bladder.  Complete cystoscopy was performed with findings noted above.  Bilateral ureters were located more cephalad and appeared somewhat inverted.  I was able to navigate a wire up the right ureter and passed an open-ended ureteral  catheter over this followed by retrograde pyelogram with findings noted above.  Same had to be performed on the left.  Both ureters were a little bit difficult to access but ultimately able to access with a wire.  I exchanged the scope for a 18 French resectoscope with a visual obturator in place.  I exchanged for the bipolar working element.  I resected the area of abnormality which spanned approximately 5 to 6 cm.  I fulgurated the resection bed.  I sent the specimen for permanent pathology.  I reinspected bladder mucosa and there was no active bleeding.  No evidence of perforation.  I drained the bladder and withdrew the scope.  Patient tolerated the procedure well was stable postoperatively  Plan: Follow-up in 1 week for pathology review

## 2022-12-09 ENCOUNTER — Encounter (HOSPITAL_COMMUNITY): Payer: Self-pay | Admitting: Urology

## 2022-12-09 LAB — SURGICAL PATHOLOGY

## 2022-12-29 ENCOUNTER — Other Ambulatory Visit (HOSPITAL_COMMUNITY): Payer: Self-pay | Admitting: Urology

## 2022-12-29 DIAGNOSIS — D494 Neoplasm of unspecified behavior of bladder: Secondary | ICD-10-CM

## 2022-12-29 DIAGNOSIS — R310 Gross hematuria: Secondary | ICD-10-CM

## 2023-02-09 DEATH — deceased

## 2023-06-24 IMAGING — CT CT HEAD W/O CM
4 series · 16 of 47 positions shown, 18 images · non-contrast
Comparison: CT head 06/20/2021

CLINICAL DATA: Facial trauma. Status post ground level fall. Left
forehead and eye injury.

EXAM:
CT HEAD WITHOUT CONTRAST
CT MAXILLOFACIAL WITHOUT CONTRAST
CT CERVICAL SPINE WITHOUT CONTRAST
TECHNIQUE: Multidetector CT imaging of the head, cervical spine, and
maxillofacial structures were performed using the standard protocol
without intravenous contrast. Multiplanar CT image reconstructions
of the cervical spine and maxillofacial structures were also
generated.

[Series 3: head wo · axial · 0.46mm/px · z∈[-180,-60]mm · 7 of 32 slices shown, 9 images]
[im 4/32  brain]
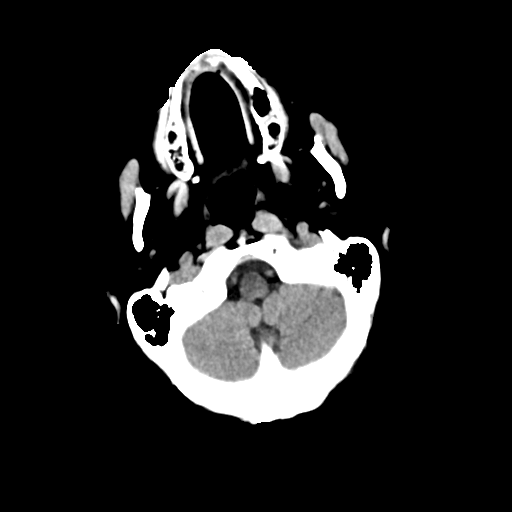
[im 4/32  bone]
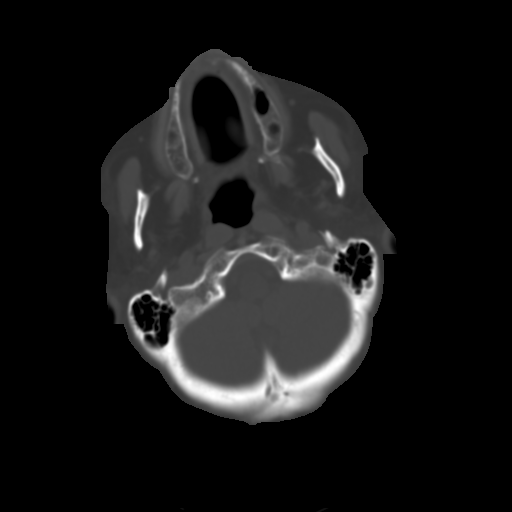
[im 8/32  brain]
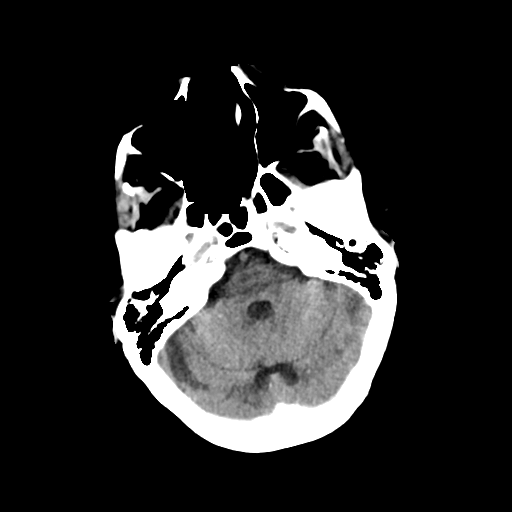
[im 12/32  brain]
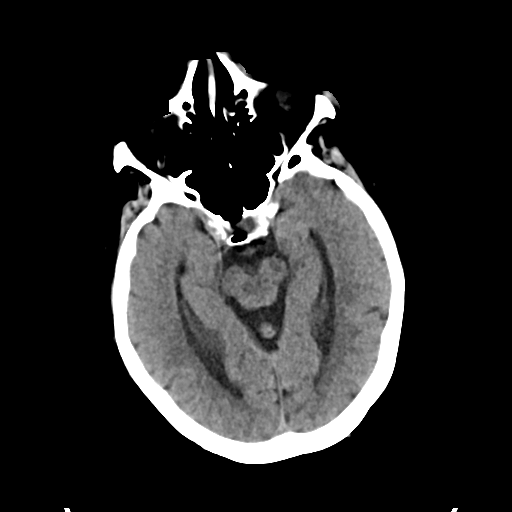
[im 16/32  brain]
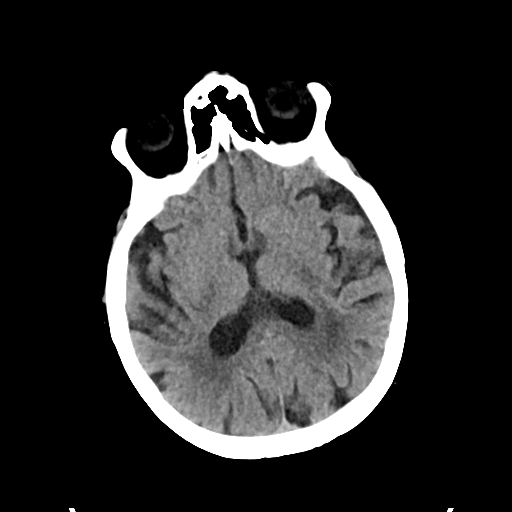
[im 20/32  brain]
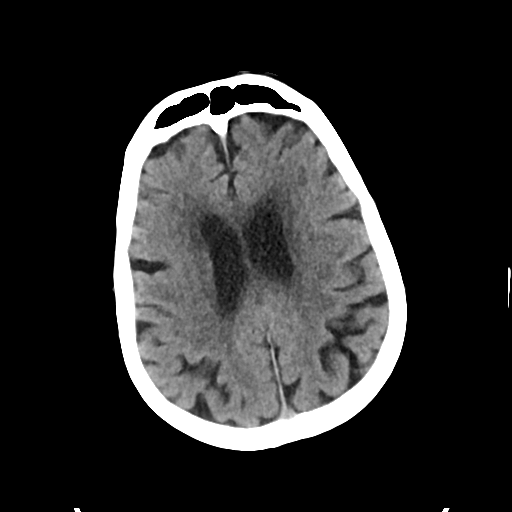
[im 20/32  bone]
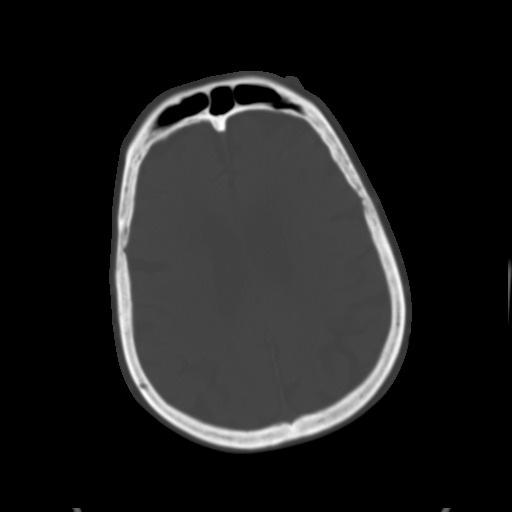
[im 24/32  brain]
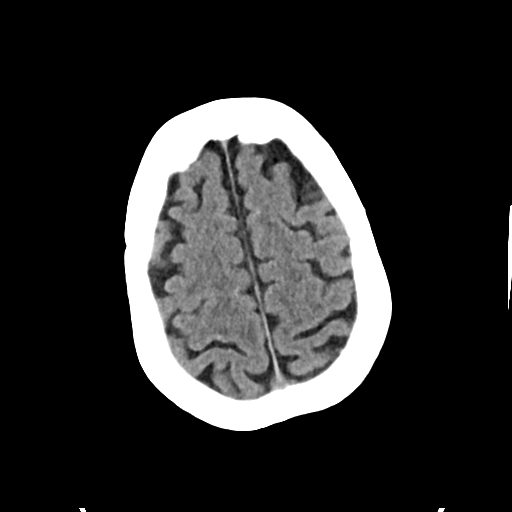
[im 28/32  brain]
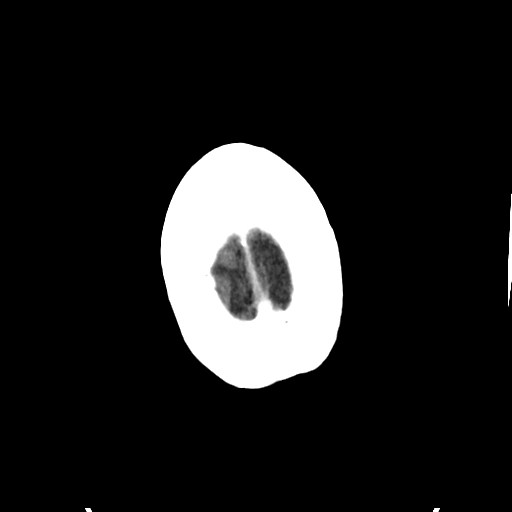

[Series 4: head bone · axial · 0.46mm/px · z∈[-181,-149]mm · 3 of 79 slices shown]
[im 8/79  bone]
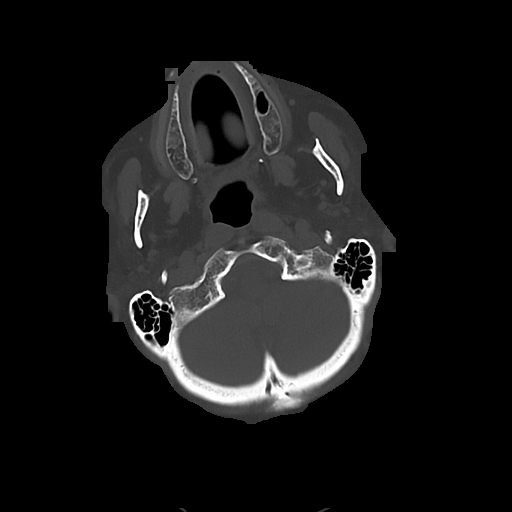
[im 16/79  bone]
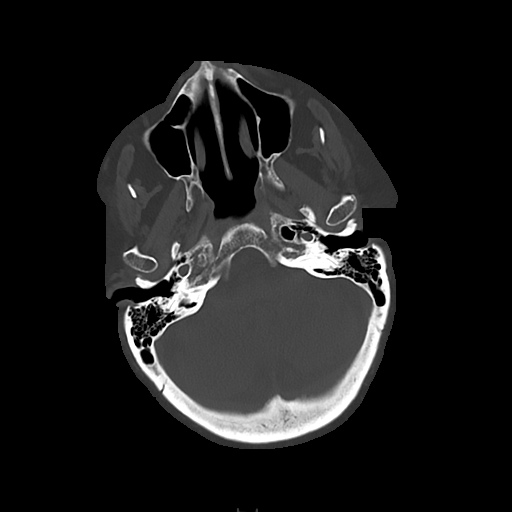
[im 24/79  bone]
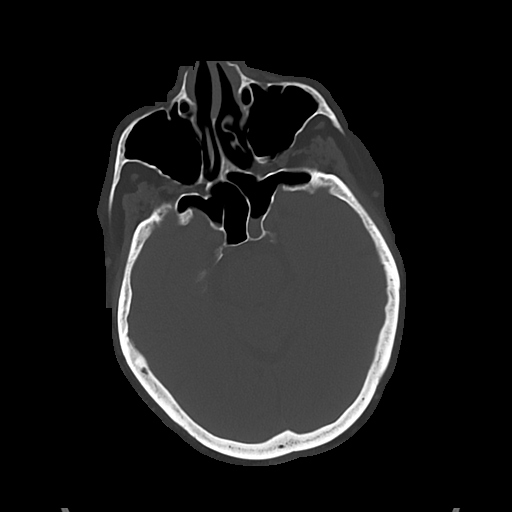

[Series 5: cor soft · coronal · 0.35mm/px · 3 of 70 slices shown]
[im 24/70  brain]
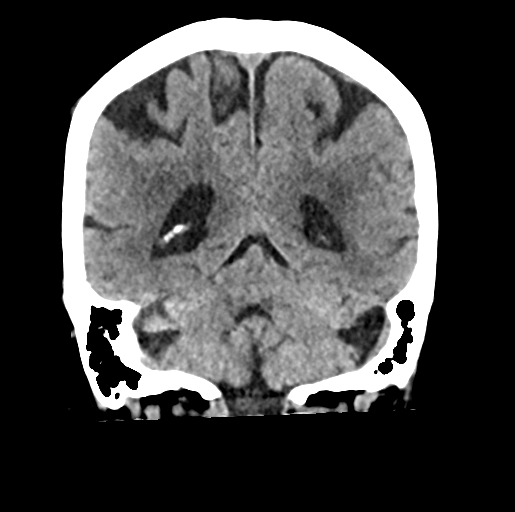
[im 31/70  brain]
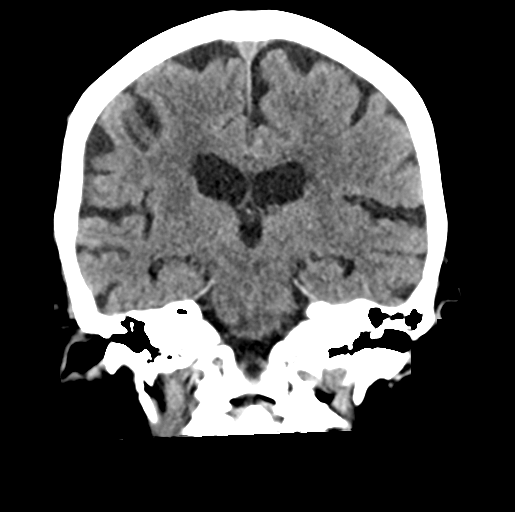
[im 39/70  brain]
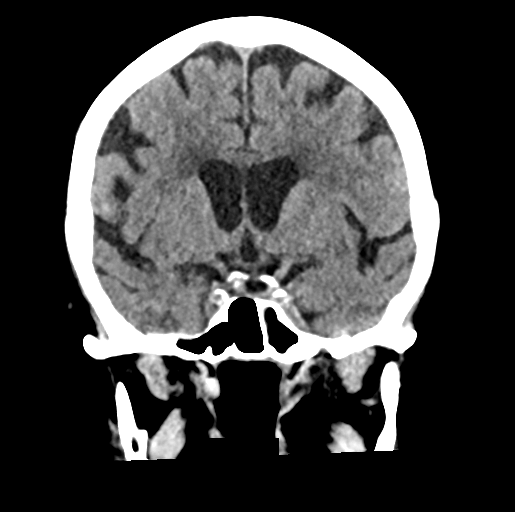

[Series 6: sag soft · sagittal · 0.35mm/px · 3 of 61 slices shown]
[im 21/61  brain]
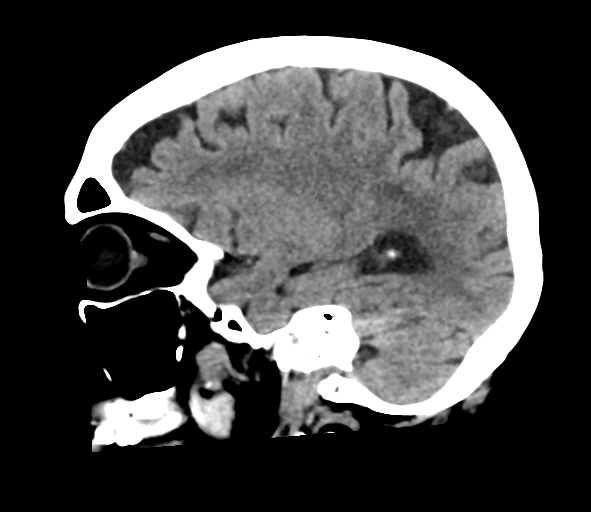
[im 31/61  brain]
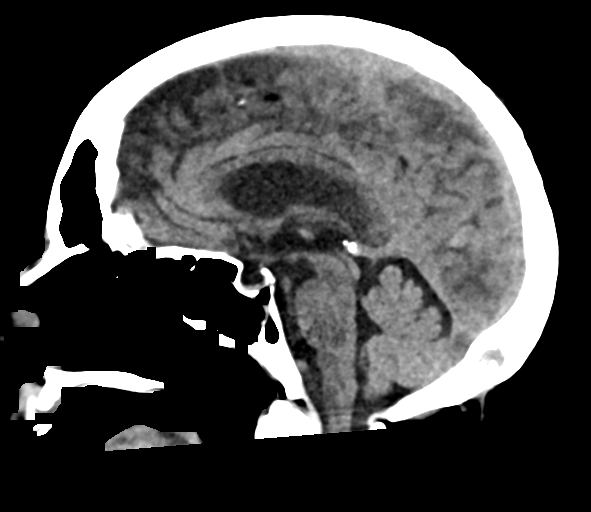
[im 41/61  brain]
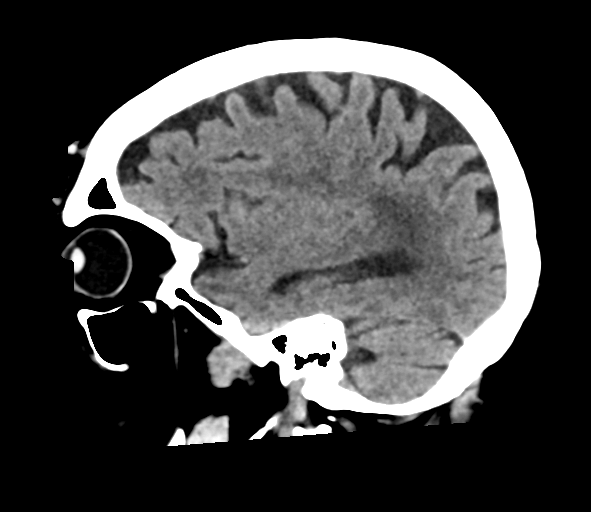

[16 of 47 positions shown; findings below may reference images not displayed]

FINDINGS: CT HEAD FINDINGS

BRAIN:
BRAIN
Cerebral ventricle sizes are concordant with the degree of cerebral
volume loss. Patchy and confluent areas of decreased attenuation are
noted throughout the deep and periventricular white matter of the
cerebral hemispheres bilaterally, compatible with chronic
microvascular ischemic disease.

No evidence of large-territorial acute infarction. No parenchymal
hemorrhage. No mass lesion. No extra-axial collection.
Similar-appearing falx cerebral calcifications.

No mass effect or midline shift. No hydrocephalus. Basilar cisterns
are patent.

Vascular: No hyperdense vessel.

Skull: No acute fracture or focal lesion.

Other: None.

CT MAXILLOFACIAL FINDINGS

Osseous: Acute minimally displaced left nasal bone fracture. No
fracture or mandibular dislocation. No destructive process.

Sinuses/Orbits: Left sphenoid sinus mucosal thickening. Otherwise
paranasal sinuses and mastoid air cells are clear. The orbits are
unremarkable.

Soft tissues: Left periorbital subcutaneus soft tissue edema and
emphysema with a small hematoma formation.

CT CERVICAL SPINE FINDINGS

Alignment: Normal.

Skull base and vertebrae: Multilevel degenerative changes of the
spine most prominent at the C5-C6 level and C6-C7 levels. No acute
fracture. No aggressive appearing focal osseous lesion or focal
pathologic process.

Soft tissues and spinal canal: No prevertebral fluid or swelling. No
visible canal hematoma.

Upper chest: Unremarkable.

Other: Carotid artery calcifications within the neck. Partially
visualized total right shoulder arthroplasty.
IMPRESSION: 1. No acute intracranial abnormality.
2. Acute minimally displaced left nasal bone fracture.
3. No acute displaced fracture or traumatic listhesis of the
cervical spine.

## 2023-06-24 IMAGING — DX DG CHEST 1V PORT
1 series · 1 of 1 positions shown · non-contrast
Comparison: Chest radiograph dated 06/20/2021.

CLINICAL DATA: Fall chronic lacunar.

EXAM:
PORTABLE CHEST 1 VIEW

[chest]
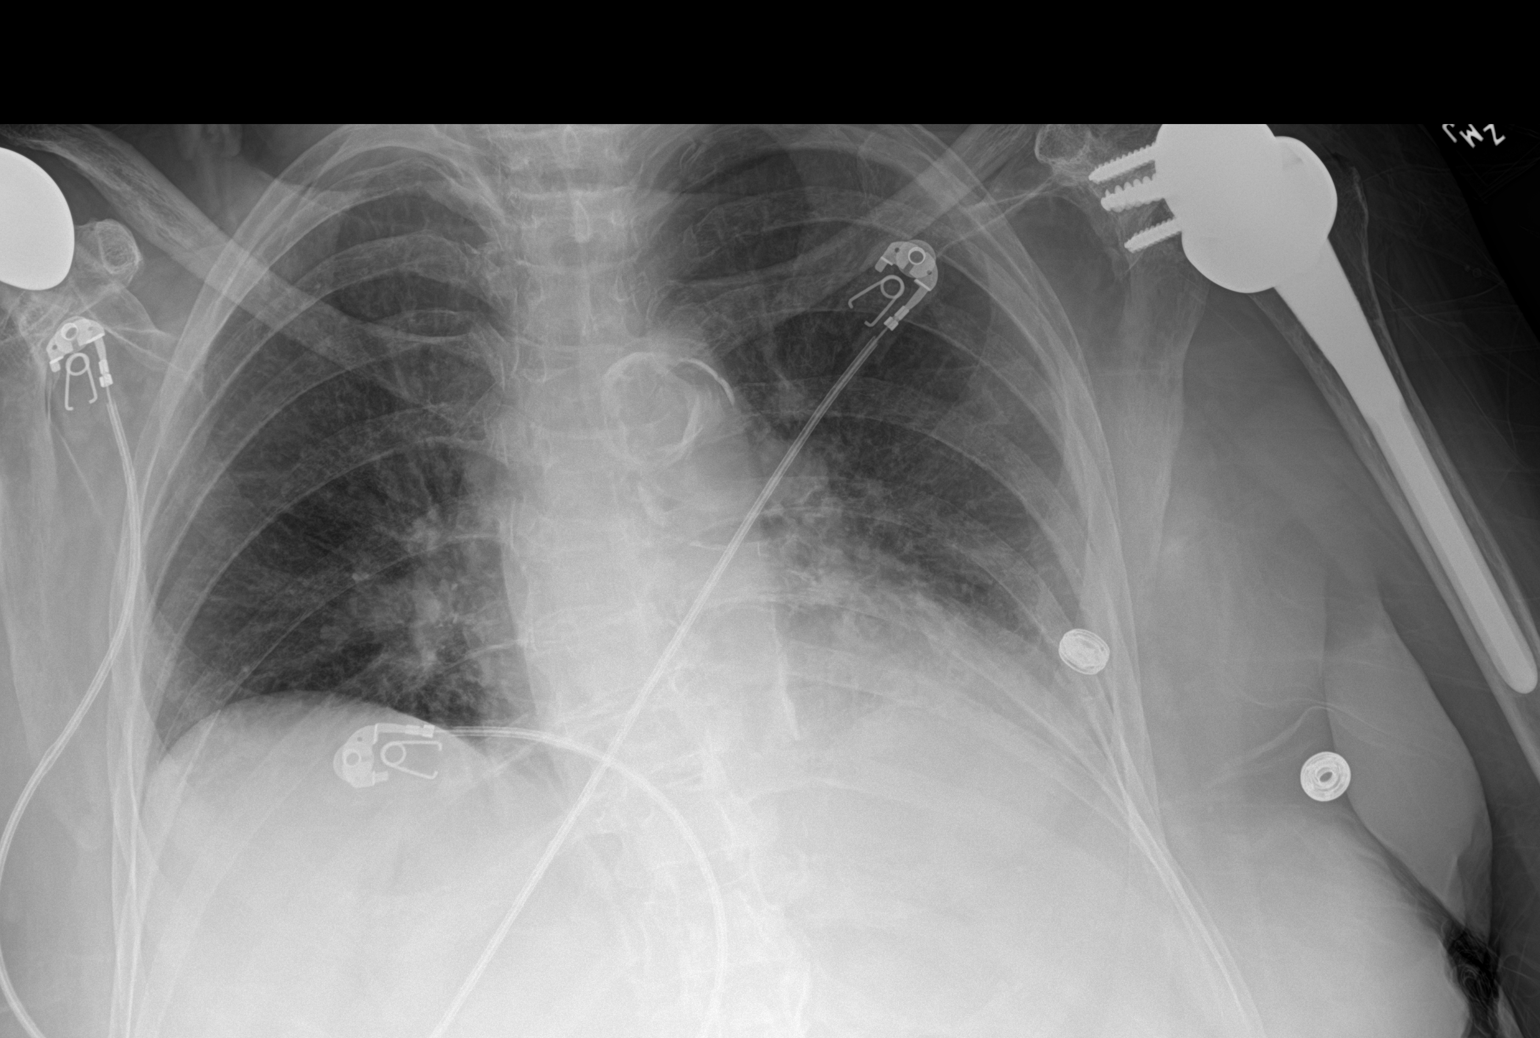

[1 of 1 positions shown; findings below may reference images not displayed]

FINDINGS: Shallow inspiration. Left lung base atelectasis. Pneumonia is not
excluded. Clinical correlation is recommended no pleural effusion
pneumothorax. The cardiac silhouette is within limits.
Atherosclerotic calcification of the aorta. No acute osseous
pathology. Bilateral shoulder arthroplasties.
IMPRESSION: Left lung base atelectasis versus infiltrate.

## 2023-06-24 IMAGING — CT CT CERVICAL SPINE W/O CM
3 series · 13 of 35 positions shown, 16 images · non-contrast
Comparison: CT head 06/20/2021

CLINICAL DATA: Facial trauma. Status post ground level fall. Left
forehead and eye injury.

EXAM:
CT HEAD WITHOUT CONTRAST
CT MAXILLOFACIAL WITHOUT CONTRAST
CT CERVICAL SPINE WITHOUT CONTRAST
TECHNIQUE: Multidetector CT imaging of the head, cervical spine, and
maxillofacial structures were performed using the standard protocol
without intravenous contrast. Multiplanar CT image reconstructions
of the cervical spine and maxillofacial structures were also
generated.

[Series 6: c spine soft · axial · 0.34mm/px · z∈[-302,-186]mm · 5 of 86 slices shown, 7 images]
[im 14/86  soft-tissue]
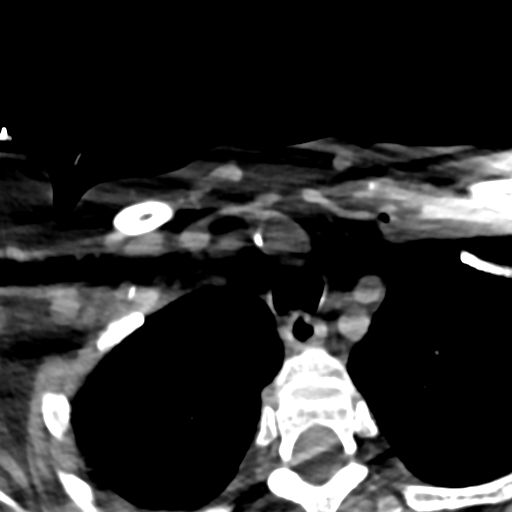
[im 14/86  bone]
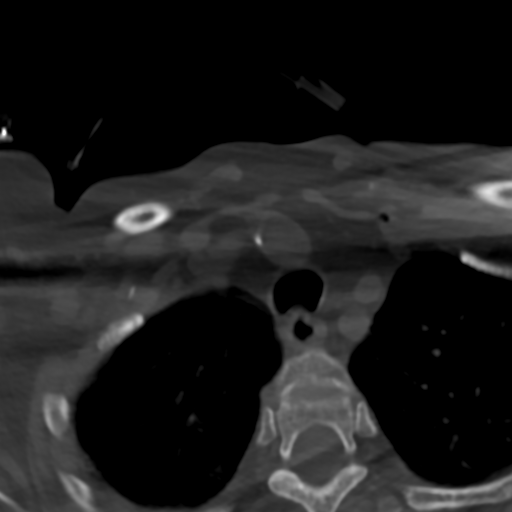
[im 27/86  bone]
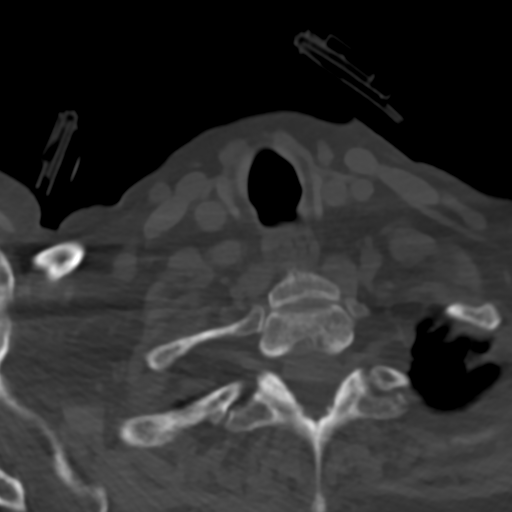
[im 46/86  bone]
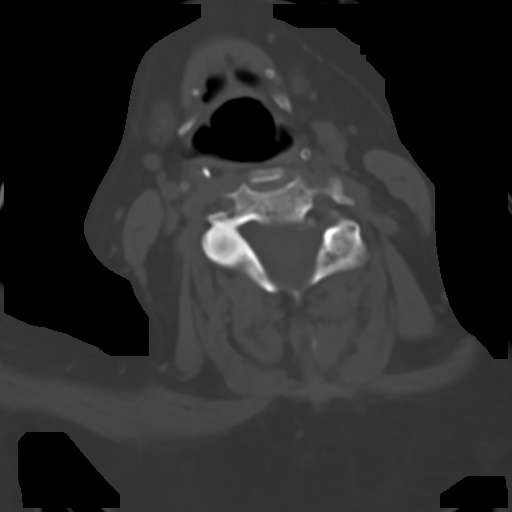
[im 59/86  bone]
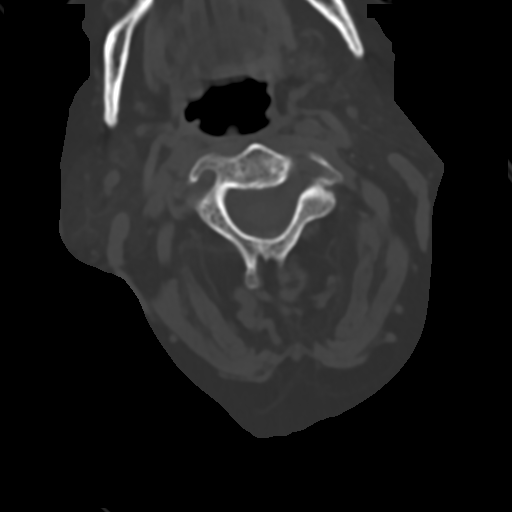
[im 72/86  soft-tissue]
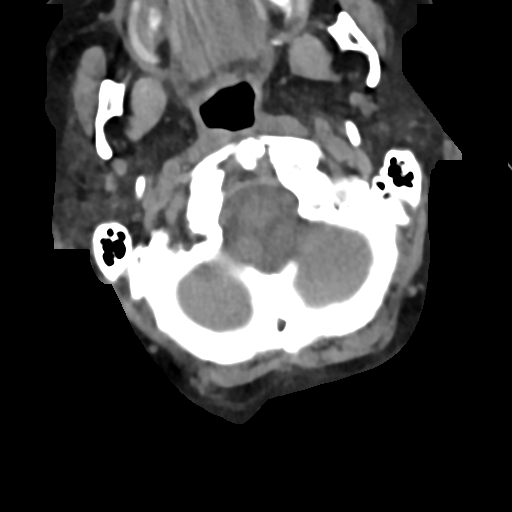
[im 72/86  bone]
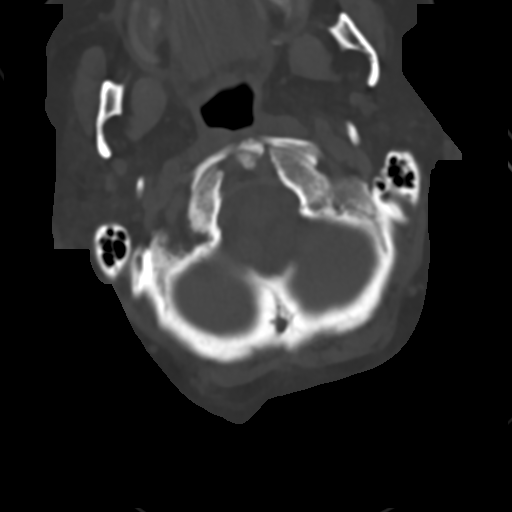

[Series 9: sag bone · sagittal · 0.34mm/px · 5 of 226 slices shown, 6 images]
[im 76/226  bone]
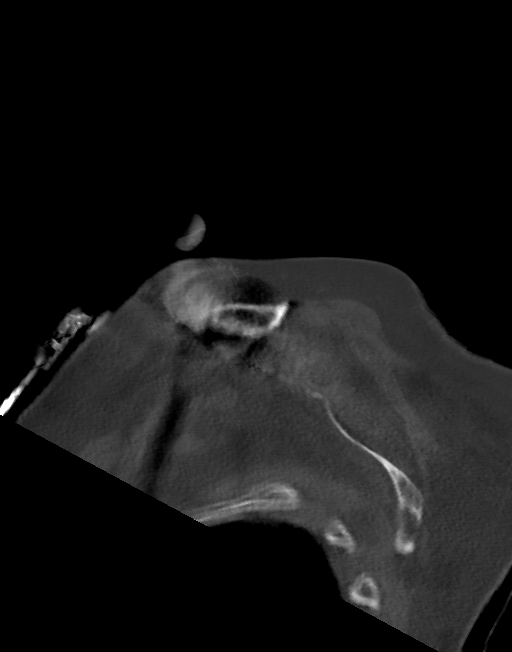
[im 94/226  bone]
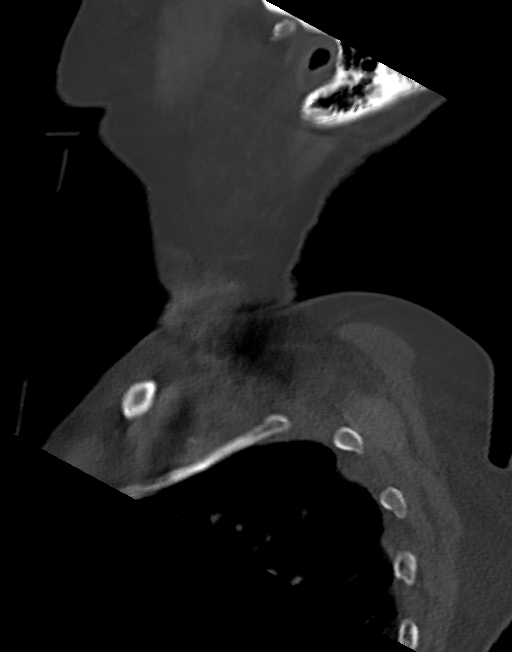
[im 113/226  soft-tissue]
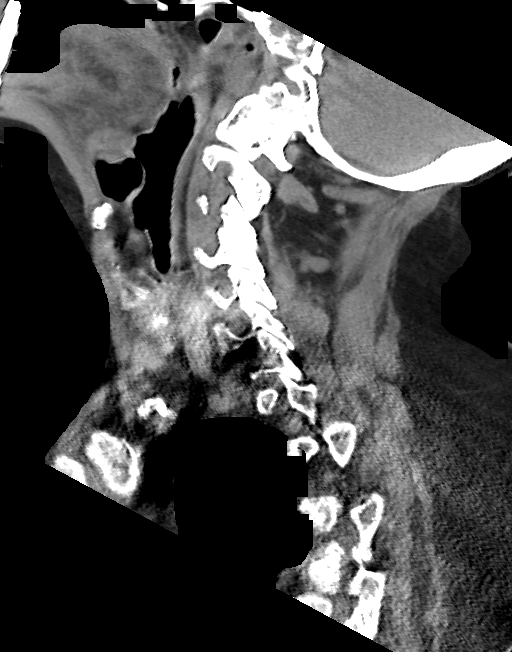
[im 113/226  bone]
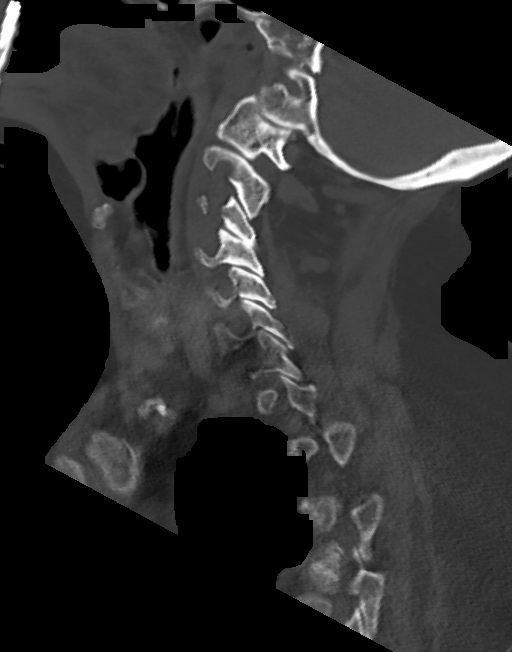
[im 132/226  bone]
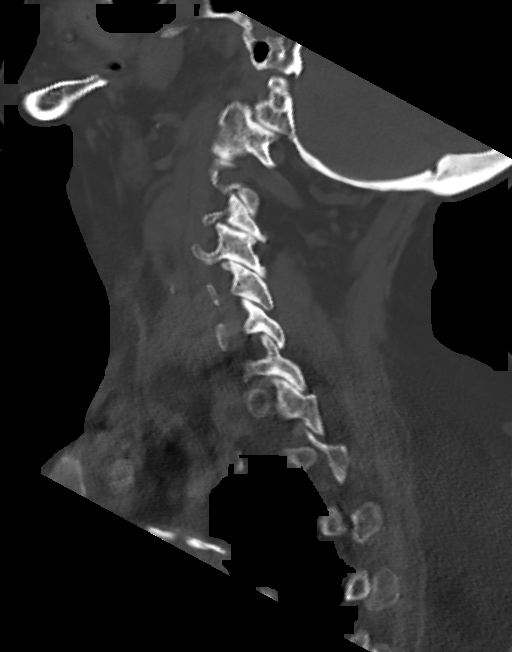
[im 151/226  bone]
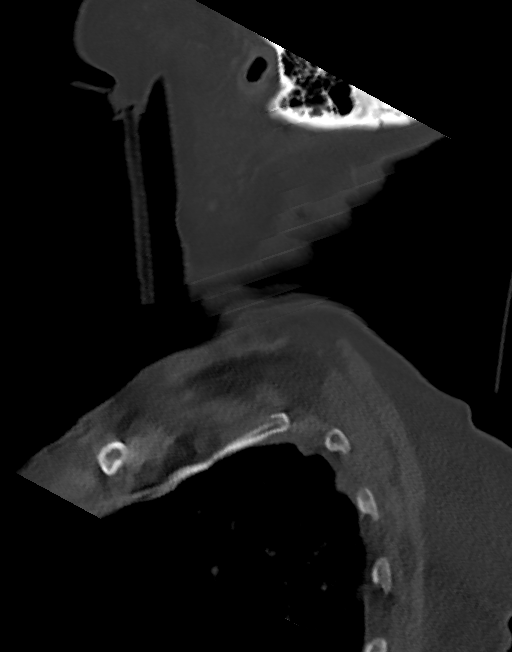

[Series 10: cor bone · coronal · 0.44mm/px · 3 of 139 slices shown]
[im 53/139  bone]
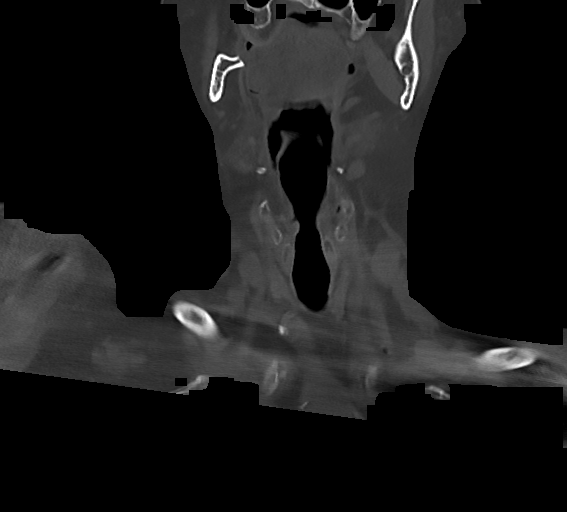
[im 64/139  bone]
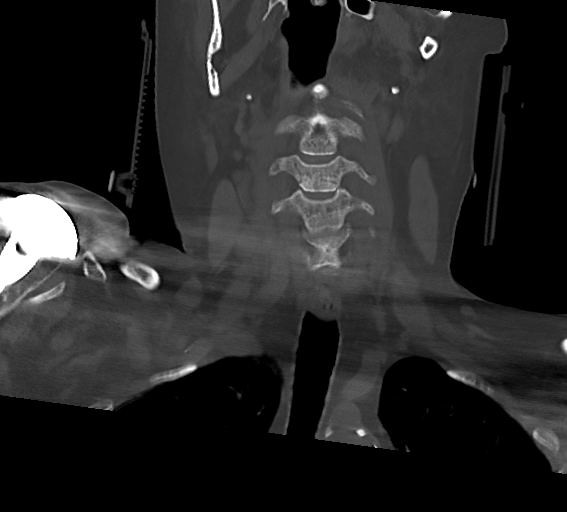
[im 75/139  bone]
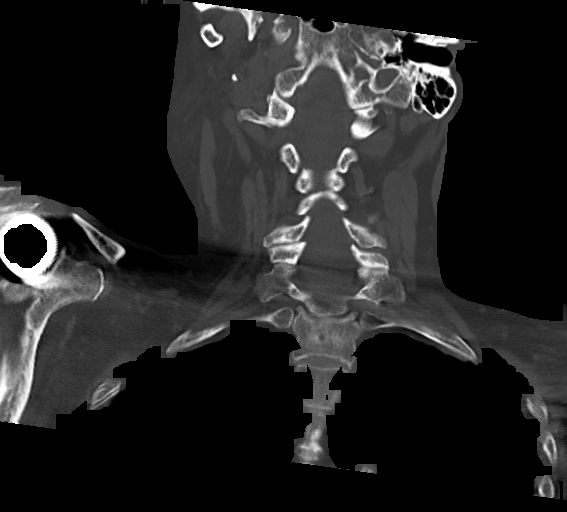

[13 of 35 positions shown; findings below may reference images not displayed]

FINDINGS: CT HEAD FINDINGS

BRAIN:
BRAIN
Cerebral ventricle sizes are concordant with the degree of cerebral
volume loss. Patchy and confluent areas of decreased attenuation are
noted throughout the deep and periventricular white matter of the
cerebral hemispheres bilaterally, compatible with chronic
microvascular ischemic disease.

No evidence of large-territorial acute infarction. No parenchymal
hemorrhage. No mass lesion. No extra-axial collection.
Similar-appearing falx cerebral calcifications.

No mass effect or midline shift. No hydrocephalus. Basilar cisterns
are patent.

Vascular: No hyperdense vessel.

Skull: No acute fracture or focal lesion.

Other: None.

CT MAXILLOFACIAL FINDINGS

Osseous: Acute minimally displaced left nasal bone fracture. No
fracture or mandibular dislocation. No destructive process.

Sinuses/Orbits: Left sphenoid sinus mucosal thickening. Otherwise
paranasal sinuses and mastoid air cells are clear. The orbits are
unremarkable.

Soft tissues: Left periorbital subcutaneus soft tissue edema and
emphysema with a small hematoma formation.

CT CERVICAL SPINE FINDINGS

Alignment: Normal.

Skull base and vertebrae: Multilevel degenerative changes of the
spine most prominent at the C5-C6 level and C6-C7 levels. No acute
fracture. No aggressive appearing focal osseous lesion or focal
pathologic process.

Soft tissues and spinal canal: No prevertebral fluid or swelling. No
visible canal hematoma.

Upper chest: Unremarkable.

Other: Carotid artery calcifications within the neck. Partially
visualized total right shoulder arthroplasty.
IMPRESSION: 1. No acute intracranial abnormality.
2. Acute minimally displaced left nasal bone fracture.
3. No acute displaced fracture or traumatic listhesis of the
cervical spine.
